# Patient Record
Sex: Male | Born: 1955 | Race: White | Hispanic: No | Smoking: Former smoker
Health system: Southern US, Community
[De-identification: ages and names within clinical notes are randomized; demographics above are authoritative.]

## PROBLEM LIST (undated history)

## (undated) DIAGNOSIS — R351 Nocturia: Secondary | ICD-10-CM

## (undated) DIAGNOSIS — K219 Gastro-esophageal reflux disease without esophagitis: Secondary | ICD-10-CM

## (undated) DIAGNOSIS — I499 Cardiac arrhythmia, unspecified: Secondary | ICD-10-CM

## (undated) DIAGNOSIS — Z973 Presence of spectacles and contact lenses: Secondary | ICD-10-CM

## (undated) DIAGNOSIS — Z974 Presence of external hearing-aid: Secondary | ICD-10-CM

## (undated) DIAGNOSIS — N401 Enlarged prostate with lower urinary tract symptoms: Secondary | ICD-10-CM

## (undated) DIAGNOSIS — Z87442 Personal history of urinary calculi: Secondary | ICD-10-CM

## (undated) DIAGNOSIS — Z87438 Personal history of other diseases of male genital organs: Secondary | ICD-10-CM

## (undated) DIAGNOSIS — K5090 Crohn's disease, unspecified, without complications: Secondary | ICD-10-CM

## (undated) DIAGNOSIS — N201 Calculus of ureter: Secondary | ICD-10-CM

## (undated) HISTORY — PX: CYSTOSCOPY/RETROGRADE/URETEROSCOPY/STONE EXTRACTION WITH BASKET: SHX5317

---

## 1995-10-02 HISTORY — PX: LUNG BIOPSY: SHX232

## 1998-05-24 ENCOUNTER — Ambulatory Visit (HOSPITAL_COMMUNITY): Admission: RE | Admit: 1998-05-24 | Discharge: 1998-05-24 | Payer: Self-pay | Admitting: Gastroenterology

## 2000-06-17 ENCOUNTER — Ambulatory Visit (HOSPITAL_COMMUNITY): Admission: RE | Admit: 2000-06-17 | Discharge: 2000-06-17 | Payer: Self-pay | Admitting: Gastroenterology

## 2000-09-25 ENCOUNTER — Ambulatory Visit (HOSPITAL_COMMUNITY): Admission: RE | Admit: 2000-09-25 | Discharge: 2000-09-25 | Payer: Self-pay | Admitting: Gastroenterology

## 2002-01-15 ENCOUNTER — Encounter: Admission: RE | Admit: 2002-01-15 | Discharge: 2002-01-15 | Payer: Self-pay | Admitting: Otolaryngology

## 2002-01-15 ENCOUNTER — Encounter: Payer: Self-pay | Admitting: Otolaryngology

## 2009-09-26 ENCOUNTER — Encounter: Admission: RE | Admit: 2009-09-26 | Discharge: 2009-09-26 | Payer: Self-pay | Admitting: Family Medicine

## 2009-10-03 ENCOUNTER — Ambulatory Visit: Payer: Self-pay | Admitting: Oncology

## 2009-10-11 LAB — CBC WITH DIFFERENTIAL/PLATELET
BASO%: 0.3 % (ref 0.0–2.0)
Basophils Absolute: 0 10*3/uL (ref 0.0–0.1)
EOS%: 3 % (ref 0.0–7.0)
Eosinophils Absolute: 0.3 10*3/uL (ref 0.0–0.5)
HCT: 45.5 % (ref 38.4–49.9)
HGB: 15.8 g/dL (ref 13.0–17.1)
LYMPH%: 14.3 % (ref 14.0–49.0)
MCH: 31.8 pg (ref 27.2–33.4)
MCHC: 34.7 g/dL (ref 32.0–36.0)
MCV: 91.8 fL (ref 79.3–98.0)
MONO#: 0.8 10*3/uL (ref 0.1–0.9)
MONO%: 8.8 % (ref 0.0–14.0)
NEUT#: 6.3 10*3/uL (ref 1.5–6.5)
NEUT%: 73.6 % (ref 39.0–75.0)
Platelets: 203 10*3/uL (ref 140–400)
RBC: 4.96 10*6/uL (ref 4.20–5.82)
RDW: 12.7 % (ref 11.0–14.6)
WBC: 8.5 10*3/uL (ref 4.0–10.3)
lymph#: 1.2 10*3/uL (ref 0.9–3.3)

## 2009-10-15 LAB — COMPREHENSIVE METABOLIC PANEL
ALT: 31 U/L (ref 0–53)
AST: 21 U/L (ref 0–37)
Albumin: 3.7 g/dL (ref 3.5–5.2)
Alkaline Phosphatase: 90 U/L (ref 39–117)
BUN: 8 mg/dL (ref 6–23)
CO2: 27 mEq/L (ref 19–32)
Calcium: 8.5 mg/dL (ref 8.4–10.5)
Chloride: 104 mEq/L (ref 96–112)
Creatinine, Ser: 0.94 mg/dL (ref 0.40–1.50)
Glucose, Bld: 79 mg/dL (ref 70–99)
Potassium: 3.6 mEq/L (ref 3.5–5.3)
Sodium: 143 mEq/L (ref 135–145)
Total Bilirubin: 0.4 mg/dL (ref 0.3–1.2)
Total Protein: 5.8 g/dL — ABNORMAL LOW (ref 6.0–8.3)

## 2009-10-15 LAB — HYPERCOAGULABLE PANEL, COMPREHENSIVE
AntiThromb III Func: 117 % (ref 76–126)
Anticardiolipin IgA: <3 APL U/mL (ref <10)
Anticardiolipin IgG: 3 GPL U/mL (ref <10)
Anticardiolipin IgM: 4 MPL U/mL (ref <10)
Beta-2 Glyco I IgG: <3 U/mL (ref <=15)
Beta-2-Glycoprotein I IgA: <3 U/mL (ref <=15)
Beta-2-Glycoprotein I IgM: <3 U/mL (ref <=15)
DRVVT 1:1 Mix: 43.4 secs (ref 36.1–47.0)
DRVVT: 105.5 secs — ABNORMAL HIGH (ref 34.7–40.5)
Drvvt confirmation: 1.64 Ratio — ABNORMAL HIGH (ref <1.18)
Homocysteine: 6.1 umol/L (ref 4.0–15.4)
Lupus Anticoagulant: DETECTED
PTT Lupus Anticoagulant: 97.8 secs — ABNORMAL HIGH (ref 32.0–43.4)
PTTLA 4:1 Mix: 60.5 secs — ABNORMAL HIGH (ref 36.3–48.8)
PTTLA Confirmation: 20.5 secs — ABNORMAL HIGH (ref <8.0)
Protein C Activity: <15 % (ref 75–133)
Protein C, Total: 91 % (ref 70–140)
Protein S Activity: 31 % — ABNORMAL LOW (ref 69–129)
Protein S Ag, Total: 58 % — ABNORMAL LOW (ref 70–140)

## 2009-10-20 LAB — HEXAGONAL PHOSPHOLIPID NEUTRALIZATION: Hex Phosph Neut Test: POSITIVE — AB

## 2009-10-21 ENCOUNTER — Encounter: Admission: RE | Admit: 2009-10-21 | Discharge: 2009-10-21 | Payer: Self-pay | Admitting: Oncology

## 2009-12-12 ENCOUNTER — Ambulatory Visit: Payer: Self-pay | Admitting: Oncology

## 2009-12-15 LAB — LUPUS ANTICOAGULANT PANEL
DRVVT 1:1 Mix: 42.9 secs (ref 36.2–44.3)
DRVVT: 83 secs — ABNORMAL HIGH (ref 36.2–44.3)
PTT Lupus Anticoagulant: 72 secs — ABNORMAL HIGH (ref 32.0–43.4)
PTTLA 4:1 Mix: 49.2 secs — ABNORMAL HIGH (ref 36.3–48.8)
PTTLA Confirmation: 1.2 secs (ref <8.0)

## 2010-05-30 IMAGING — CT CT ABD-PELV W/ CM
3 of 5 series · 13 of 36 positions shown, 19 images · IV contrast (READICAT/WATER & [ID] OMNI 300)
Comparison: None

CLINICAL DATA: Abdominal pain, history of Crohn's disease on, also
history of pulmonary embolism

CT ABDOMEN AND PELVIS WITH CONTRAST
TECHNIQUE: Multidetector CT imaging of the abdomen and pelvis was
performed following the standard protocol during bolus
administration of intravenous contrast.
Contrast: 100 ml 2mnipaque-GVV

[Series 3: routine abdomen · axial · 0.75mm/px · z∈[-404,-24]mm · 8 of 97 slices shown, 13 images]
[im 11/97  soft-tissue]
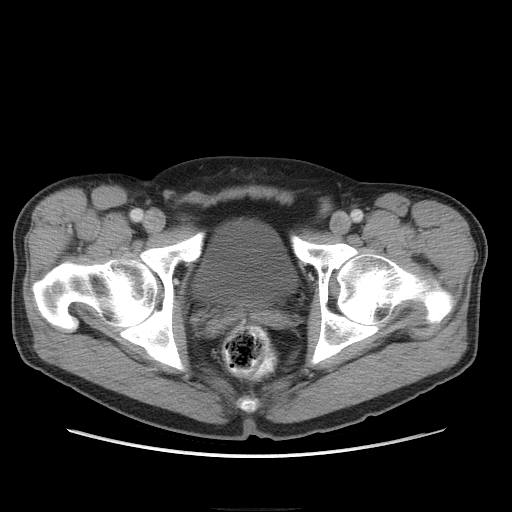
[im 11/97  bone]
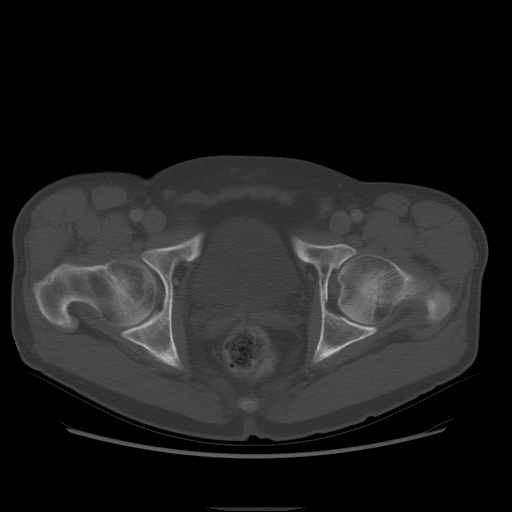
[im 22/97  soft-tissue]
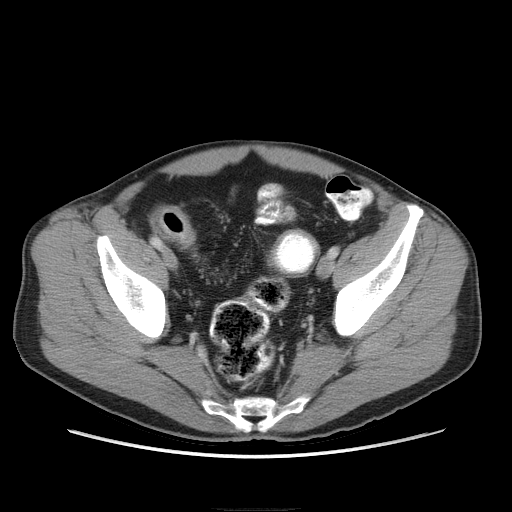
[im 33/97  soft-tissue]
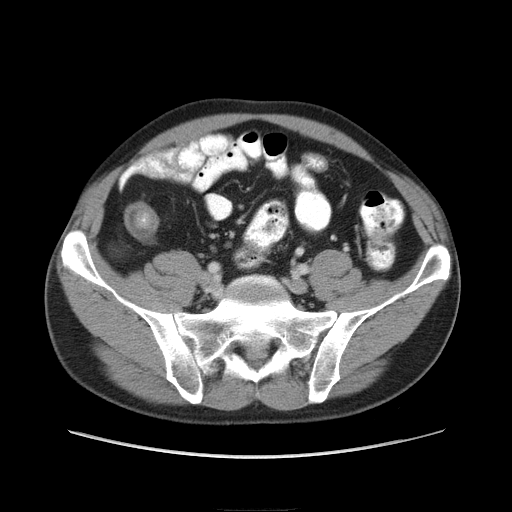
[im 43/97  soft-tissue]
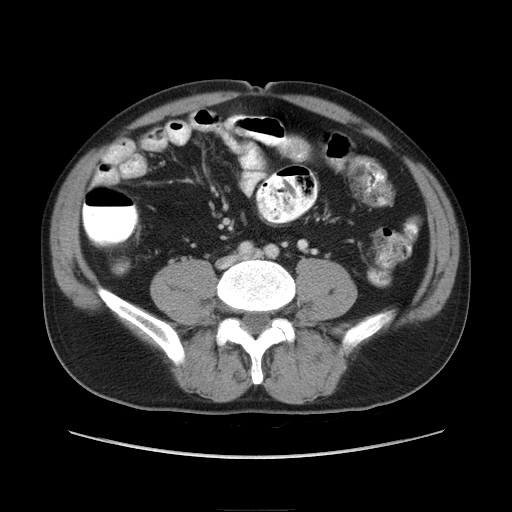
[im 54/97  soft-tissue]
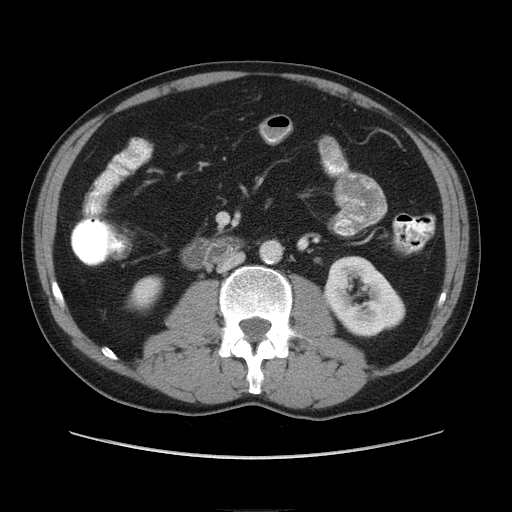
[im 54/97  lung]
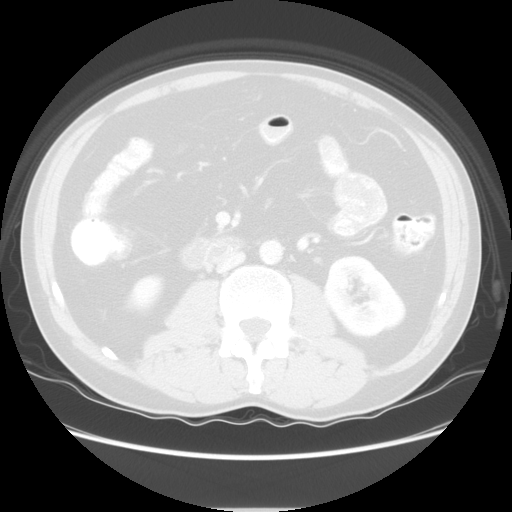
[im 65/97  soft-tissue]
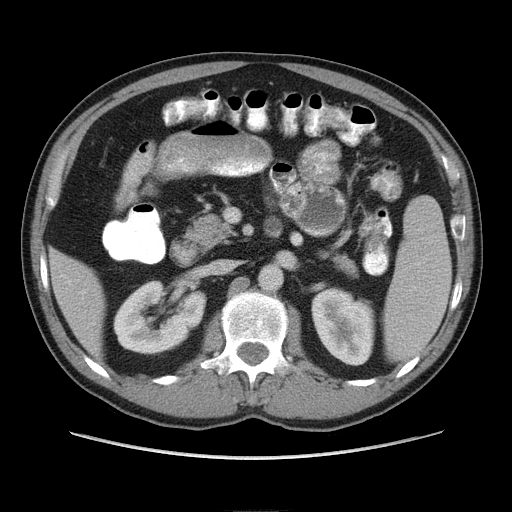
[im 65/97  lung]
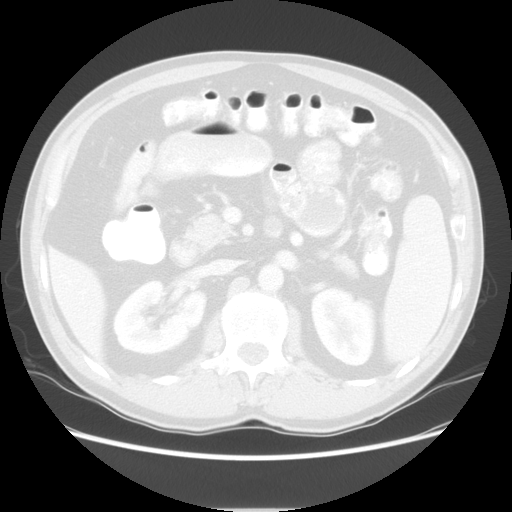
[im 75/97  soft-tissue]
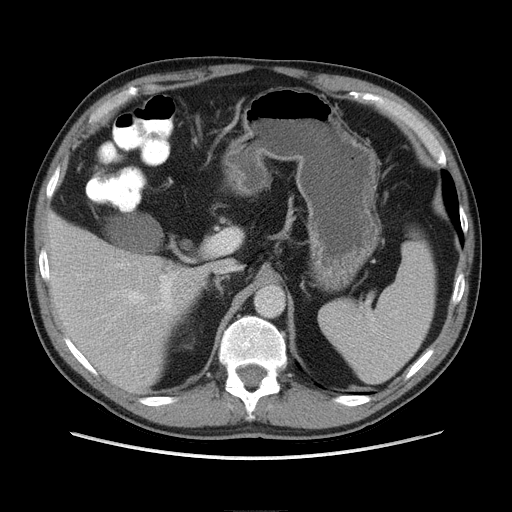
[im 75/97  lung]
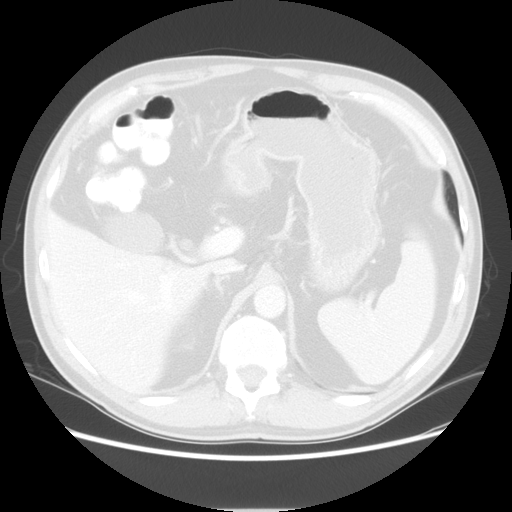
[im 86/97  soft-tissue]
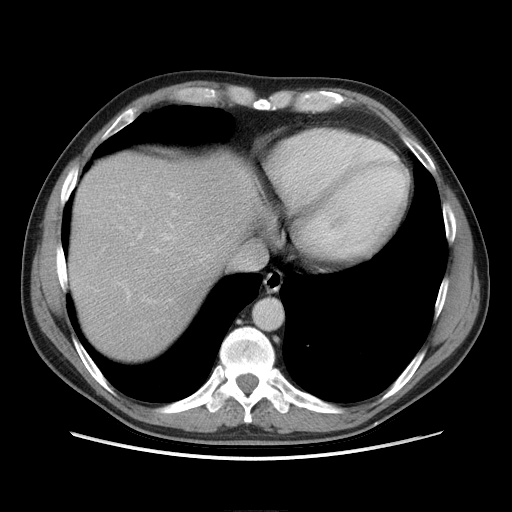
[im 86/97  lung]
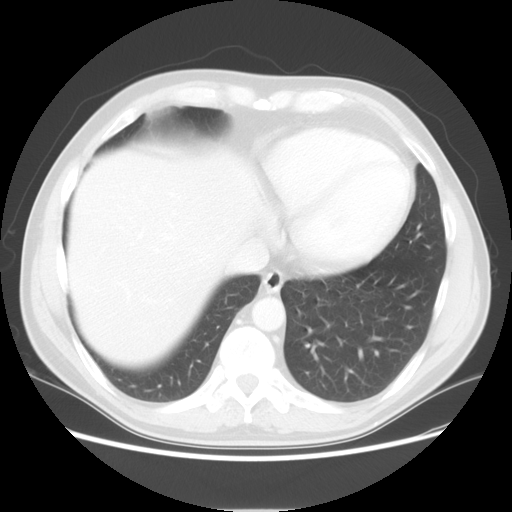

[Series 601: coronal body · coronal · 0.99mm/px · 1 of 122 slices shown, 2 images]
[im 41/122  soft-tissue]
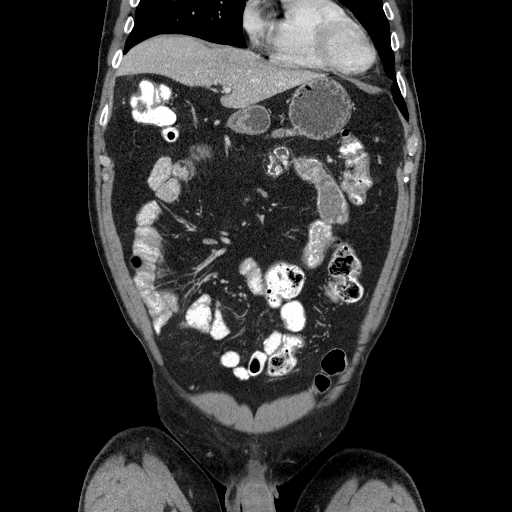
[im 41/122  bone]
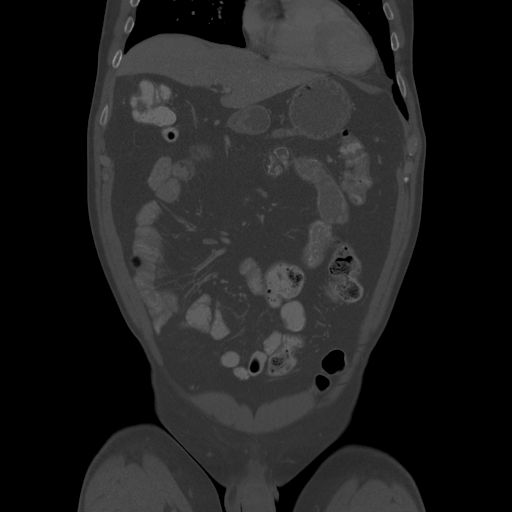

[Series 602: sagittal body · sagittal · 0.99mm/px · 4 of 151 slices shown]
[im 11/151  soft-tissue]
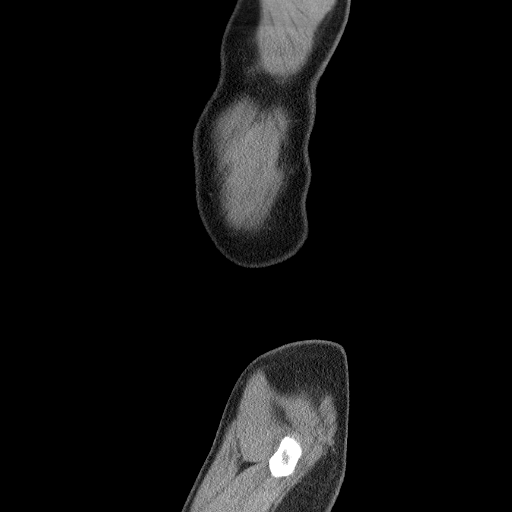
[im 31/151  soft-tissue]
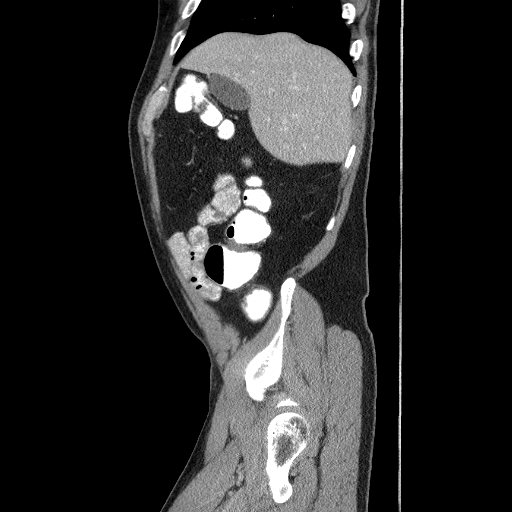
[im 51/151  soft-tissue]
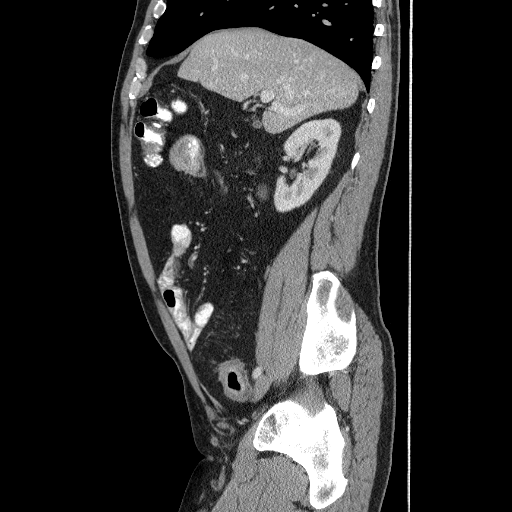
[im 71/151  soft-tissue]
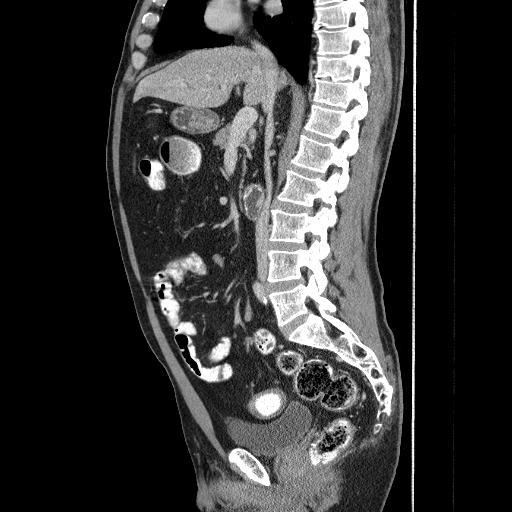

[13 of 36 positions shown; findings below may reference images not displayed]

FINDINGS: The lung bases are clear.  The liver enhances with no
focal abnormality and no ductal dilatation is seen.  No calcified
gallstones are noted.  The pancreas is normal in size and the
pancreatic duct is not dilated.  The adrenal glands and spleen are
unremarkable.  The stomach is not optimally distended but no
abnormality is evident.  The kidneys enhance with no calculus or
mass and no hydronephrosis is seen on delayed images.  The
abdominal aorta is normal in caliber with minimal atheromatous
change.

There is circumferential mucosal edema of the distal and terminal
ileum over a length of approximately 25-30 cm.  There is some
dilatation of the more proximal involved distal ileal segment which
may indicate a mild stricture of the distal ileum.  These findings
are consistent with active and chronic Crohn's disease. No abscess
is seen.  The urinary bladder is unremarkable.  The prostate is
normal in size.  There may also very mild mucosal edema involving
the ileocecal valve and base of the cecum which may indicate some
colonic involvement by inflammatory bowel disease as well. No bony
abnormality is seen
IMPRESSION: 1.  Mucosal edema of the distal and terminal ileum with some
possible involvement of the base of the cecum consistent with
active and chronic Crohn's disease.  No abscess.
2.  Slight dilatation of the more proximal involved segment of
distal ileum suggesting mild stricture formation.

## 2010-10-22 ENCOUNTER — Encounter: Payer: Self-pay | Admitting: Oncology

## 2010-10-23 ENCOUNTER — Encounter
Admission: RE | Admit: 2010-10-23 | Discharge: 2010-10-23 | Payer: Self-pay | Source: Home / Self Care | Attending: Gastroenterology | Admitting: Gastroenterology

## 2010-11-01 ENCOUNTER — Encounter: Payer: Self-pay | Admitting: Gastroenterology

## 2014-01-13 ENCOUNTER — Other Ambulatory Visit: Payer: Self-pay | Admitting: Gastroenterology

## 2014-01-25 ENCOUNTER — Encounter (HOSPITAL_COMMUNITY): Payer: Self-pay | Admitting: Pharmacy Technician

## 2014-02-01 ENCOUNTER — Encounter (HOSPITAL_COMMUNITY): Payer: Self-pay | Admitting: *Deleted

## 2014-02-16 ENCOUNTER — Encounter (HOSPITAL_COMMUNITY)
Admission: RE | Disposition: A | Discharge status: Home / Self Care | Payer: Self-pay | Source: Ambulatory Visit | Attending: Gastroenterology

## 2014-02-16 ENCOUNTER — Encounter (INDEPENDENT_AMBULATORY_CARE_PROVIDER_SITE_OTHER): Payer: Self-pay

## 2014-02-16 ENCOUNTER — Encounter (HOSPITAL_COMMUNITY): Payer: Self-pay | Admitting: *Deleted

## 2014-02-16 ENCOUNTER — Ambulatory Visit (HOSPITAL_COMMUNITY)
Admission: RE | Admit: 2014-02-16 | Discharge: 2014-02-16 | Disposition: A | Discharge status: Home / Self Care | Payer: 59 | Source: Ambulatory Visit | Attending: Gastroenterology | Admitting: Gastroenterology

## 2014-02-16 ENCOUNTER — Ambulatory Visit (HOSPITAL_COMMUNITY): Payer: 59 | Admitting: Anesthesiology

## 2014-02-16 ENCOUNTER — Encounter (HOSPITAL_COMMUNITY): Payer: 59 | Admitting: Anesthesiology

## 2014-02-16 DIAGNOSIS — IMO0002 Reserved for concepts with insufficient information to code with codable children: Secondary | ICD-10-CM | POA: Insufficient documentation

## 2014-02-16 DIAGNOSIS — M810 Age-related osteoporosis without current pathological fracture: Secondary | ICD-10-CM | POA: Insufficient documentation

## 2014-02-16 DIAGNOSIS — D128 Benign neoplasm of rectum: Secondary | ICD-10-CM | POA: Insufficient documentation

## 2014-02-16 DIAGNOSIS — E538 Deficiency of other specified B group vitamins: Secondary | ICD-10-CM | POA: Insufficient documentation

## 2014-02-16 DIAGNOSIS — E7800 Pure hypercholesterolemia, unspecified: Secondary | ICD-10-CM | POA: Insufficient documentation

## 2014-02-16 DIAGNOSIS — Z1211 Encounter for screening for malignant neoplasm of colon: Secondary | ICD-10-CM | POA: Insufficient documentation

## 2014-02-16 DIAGNOSIS — D129 Benign neoplasm of anus and anal canal: Secondary | ICD-10-CM | POA: Insufficient documentation

## 2014-02-16 DIAGNOSIS — K219 Gastro-esophageal reflux disease without esophagitis: Secondary | ICD-10-CM | POA: Insufficient documentation

## 2014-02-16 DIAGNOSIS — K5010 Crohn's disease of large intestine without complications: Secondary | ICD-10-CM | POA: Insufficient documentation

## 2014-02-16 DIAGNOSIS — I059 Rheumatic mitral valve disease, unspecified: Secondary | ICD-10-CM | POA: Insufficient documentation

## 2014-02-16 DIAGNOSIS — D126 Benign neoplasm of colon, unspecified: Secondary | ICD-10-CM | POA: Insufficient documentation

## 2014-02-16 HISTORY — DX: Crohn's disease, unspecified, without complications: K50.90

## 2014-02-16 HISTORY — PX: COLONOSCOPY WITH PROPOFOL: SHX5780

## 2014-02-16 SURGERY — COLONOSCOPY WITH PROPOFOL
Anesthesia: Monitor Anesthesia Care

## 2014-02-16 MED ORDER — LACTATED RINGERS IV SOLN: INTRAVENOUS | Status: DC

## 2014-02-16 MED ORDER — PROPOFOL 10 MG/ML IV BOLUS
INTRAVENOUS | Status: AC
  Filled 2014-02-16: qty 20

## 2014-02-16 MED ORDER — PROPOFOL 10 MG/ML IV BOLUS
INTRAVENOUS | Status: DC | PRN
  Administered 2014-02-16: 50 mg via INTRAVENOUS
  Administered 2014-02-16 (×2): 25 mg via INTRAVENOUS
  Administered 2014-02-16: 50 mg via INTRAVENOUS
  Administered 2014-02-16 (×2): 25 mg via INTRAVENOUS
  Administered 2014-02-16: 50 mg via INTRAVENOUS
  Administered 2014-02-16: 25 mg via INTRAVENOUS
  Administered 2014-02-16 (×2): 50 mg via INTRAVENOUS

## 2014-02-16 MED ORDER — LACTATED RINGERS IV SOLN
INTRAVENOUS | Status: DC | PRN
  Administered 2014-02-16: 12:00:00 via INTRAVENOUS

## 2014-02-16 SURGICAL SUPPLY — 22 items

## 2014-02-16 NOTE — Anesthesia Postprocedure Evaluation (Signed)
  Anesthesia Post-op Note  Patient: Nathan Shaw  Procedure(s) Performed: Procedure(s) (LRB): COLONOSCOPY WITH PROPOFOL (N/A)  Patient Location: PACU  Anesthesia Type: MAC  Level of Consciousness: awake and alert   Airway and Oxygen Therapy: Patient Spontanous Breathing  Post-op Pain: mild  Post-op Assessment: Post-op Vital signs reviewed, Patient's Cardiovascular Status Stable, Respiratory Function Stable, Patent Airway and No signs of Nausea or vomiting  Last Vitals:  Filed Vitals:   02/16/14 1305  BP: 110/69  Temp: 36.4 C  Resp: 20    Post-op Vital Signs: stable   Complications: No apparent anesthesia complications

## 2014-02-16 NOTE — Transfer of Care (Signed)
Immediate Anesthesia Transfer of Care Note  Patient: Nathan Shaw  Procedure(s) Performed: Procedure(s): COLONOSCOPY WITH PROPOFOL (N/A)  Patient Location: PACU  Anesthesia Type:MAC  Level of Consciousness: awake, sedated and patient cooperative  Airway & Oxygen Therapy: Patient Spontanous Breathing and Patient connected to face mask oxygen  Post-op Assessment: Report given to PACU RN and Post -op Vital signs reviewed and stable  Post vital signs: Reviewed and stable  Complications: No apparent anesthesia complications

## 2014-02-16 NOTE — H&P (Signed)
  Problem: Crohn's disease of the duodenum, ileum, right colon  History: The patient is a 58 year old male born [birth year]. He was diagnosed with Crohn's disease involving the duodenum, ileum, and right colon. In March 2010, colonoscopy showed an active Crohn's ileocolitis.  The patient has completed his course of prednisone therapy to treat recurrent Crohn's ileocolitis. His fever, abdominal pain, and diarrhea have resolved.  The patient is scheduled to undergo a surveillance colonoscopy today.  Past medical history: Crohn's ileocolitis. Osteoporosis. Vitamin B12 deficiency. Degenerative disc disease. Gastroesophageal reflux. Mitral valve prolapse. Hypercholesterolemia. History of kidney stones. Pulmonary embolism diagnosed in 2011. Allergic rhinitis. 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 Vasectomy.  Medication allergies: None  Exam: The patient is alert and lying comfortably on the endoscopy stretcher. Abdomen is soft and nontender to palpation. Lungs are clear to auscultation. Cardiac exam reveals a regular rhythm.  Plan: Proceed with surveillance colonoscopy

## 2014-02-16 NOTE — Op Note (Signed)
Problem: Crohn's disease involving the terminal ileum and right colon. Inactive Crohn's colitis by colonoscopy performed on 12/10/2008  Endoscopist: Earle Gell  Premedication: Propofol administered by anesthesia  Procedure: Surveillance colonoscopy The patient was placed in the left lateral decubitus position. Anal inspection and digital rectal exam were normal. The prostate was nonnodular. The Pentax pediatric colonoscope was introduced into the rectum and advanced to the cecum. A normal-appearing appendiceal orifice and stenotic ileocecal valve were identified. I was unable to intubate the stenotic ileocecal valve to examine the terminal ileum. Colonic preparation for the exam today was good.  Rectum. From the distal rectum, a 7 mm sessile polyp was removed with the electrocautery snare. From the mid rectum, a 5 mm sessile polyp was removed with the cold snare.  Sigmoid colon and descending colon. Normal  Splenic flexure. Normal  Transverse colon. Normal  Hepatic flexure. Normal  Ascending colon. In active colitis manifested by generalized lost in the mucosal vascular pattern with mucosal scarring.  Cecum and ileocecal valve. In active colitis manifested by generalized lost in the mucosal vascular pattern with mucosal scarring. Stenotic ileocecal valve.  Surveillance mucosal biopsies. 8 biopsies were performed from the cecum and descending colon to look for mucosal dysplasia  Assessment:  #1. Active Crohn's colitis involving the cecum and descending colon; the remainder of the colonic mucosa appeared normal without evidence of Crohn's colitis  #2. 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 From the distal rectum a 7 mm sessile polyp was removed with the hot snare  #4. From the mid rectum a 5 mm sessile polyp was removed with the cold snare  Recommendation: Await pathology

## 2014-02-16 NOTE — Anesthesia Preprocedure Evaluation (Signed)
Anesthesia Evaluation  Patient identified by MRN, date of birth, ID band Patient awake    Reviewed: Allergy & Precautions, H&P , NPO status , Patient's Chart, lab work & pertinent test results  Airway Mallampati: II TM Distance: >3 FB Neck ROM: Full    Dental no notable dental hx.    Pulmonary neg pulmonary ROS, former smoker,  breath sounds clear to auscultation  Pulmonary exam normal       Cardiovascular negative cardio ROS  Rhythm:Regular Rate:Normal     Neuro/Psych negative neurological ROS  negative psych ROS   GI/Hepatic Neg liver ROS, chrohns dz   Endo/Other  negative endocrine ROS  Renal/GU negative Renal ROS  negative genitourinary   Musculoskeletal negative musculoskeletal ROS (+)   Abdominal   Peds negative pediatric ROS (+)  Hematology negative hematology ROS (+)   Anesthesia Other Findings   Reproductive/Obstetrics negative OB ROS                           Anesthesia Physical Anesthesia Plan  ASA: II  Anesthesia Plan: MAC   Post-op Pain Management:    Induction: Intravenous  Airway Management Planned: Nasal Cannula  Additional Equipment:   Intra-op Plan:   Post-operative Plan:   Informed Consent: I have reviewed the patients History and Physical, chart, labs and discussed the procedure including the risks, benefits and alternatives for the proposed anesthesia with the patient or authorized representative who has indicated his/her understanding and acceptance.   Dental advisory given  Plan Discussed with: CRNA and Surgeon  Anesthesia Plan Comments:         Anesthesia Quick Evaluation

## 2014-02-18 ENCOUNTER — Encounter (HOSPITAL_COMMUNITY): Payer: Self-pay | Admitting: Gastroenterology

## 2014-07-30 ENCOUNTER — Other Ambulatory Visit: Payer: Self-pay | Admitting: Orthopedic Surgery

## 2014-08-03 ENCOUNTER — Encounter (HOSPITAL_BASED_OUTPATIENT_CLINIC_OR_DEPARTMENT_OTHER): Payer: Self-pay | Admitting: *Deleted

## 2014-08-03 NOTE — Progress Notes (Signed)
No labs needed

## 2014-08-05 ENCOUNTER — Encounter (HOSPITAL_BASED_OUTPATIENT_CLINIC_OR_DEPARTMENT_OTHER): Payer: Self-pay | Admitting: *Deleted

## 2014-08-05 ENCOUNTER — Encounter (HOSPITAL_BASED_OUTPATIENT_CLINIC_OR_DEPARTMENT_OTHER)
Admission: RE | Disposition: A | Discharge status: Home / Self Care | Payer: Self-pay | Source: Ambulatory Visit | Attending: Orthopedic Surgery

## 2014-08-05 ENCOUNTER — Observation Stay (HOSPITAL_BASED_OUTPATIENT_CLINIC_OR_DEPARTMENT_OTHER): Payer: 59 | Admitting: Anesthesiology

## 2014-08-05 ENCOUNTER — Observation Stay (HOSPITAL_BASED_OUTPATIENT_CLINIC_OR_DEPARTMENT_OTHER)
Admission: RE | Admit: 2014-08-05 | Discharge: 2014-08-05 | Disposition: A | Discharge status: Home / Self Care | Payer: 59 | Source: Ambulatory Visit | Attending: Orthopedic Surgery | Admitting: Orthopedic Surgery

## 2014-08-05 DIAGNOSIS — Z7982 Long term (current) use of aspirin: Secondary | ICD-10-CM | POA: Diagnosis not present

## 2014-08-05 DIAGNOSIS — G5601 Carpal tunnel syndrome, right upper limb: Principal | ICD-10-CM | POA: Insufficient documentation

## 2014-08-05 DIAGNOSIS — Z87891 Personal history of nicotine dependence: Secondary | ICD-10-CM | POA: Insufficient documentation

## 2014-08-05 DIAGNOSIS — K5090 Crohn's disease, unspecified, without complications: Secondary | ICD-10-CM | POA: Insufficient documentation

## 2014-08-05 HISTORY — PX: CARPAL TUNNEL RELEASE: SHX101

## 2014-08-05 HISTORY — DX: Presence of spectacles and contact lenses: Z97.3

## 2014-08-05 SURGERY — CARPAL TUNNEL RELEASE
Anesthesia: Regional | Laterality: Right

## 2014-08-05 MED ORDER — MIDAZOLAM HCL 2 MG/2ML IJ SOLN
INTRAMUSCULAR | Status: AC
  Filled 2014-08-05: qty 2

## 2014-08-05 MED ORDER — CEFAZOLIN SODIUM-DEXTROSE 2-3 GM-% IV SOLR: 2.0000 g | INTRAVENOUS | Status: DC

## 2014-08-05 MED ORDER — LACTATED RINGERS IV SOLN
INTRAVENOUS | Status: DC
  Administered 2014-08-05 (×2): via INTRAVENOUS

## 2014-08-05 MED ORDER — MIDAZOLAM HCL 5 MG/5ML IJ SOLN
INTRAMUSCULAR | Status: DC | PRN
  Administered 2014-08-05: 2 mg via INTRAVENOUS

## 2014-08-05 MED ORDER — BUPIVACAINE HCL (PF) 0.25 % IJ SOLN
INTRAMUSCULAR | Status: DC | PRN
  Administered 2014-08-05: 7 mL

## 2014-08-05 MED ORDER — MIDAZOLAM HCL 2 MG/2ML IJ SOLN: 1.0000 mg | INTRAMUSCULAR | Status: DC | PRN

## 2014-08-05 MED ORDER — HYDROCODONE-ACETAMINOPHEN 5-325 MG PO TABS: 1.0000 | ORAL_TABLET | Freq: Four times a day (QID) | ORAL | Status: DC | PRN

## 2014-08-05 MED ORDER — 0.9 % SODIUM CHLORIDE (POUR BTL) OPTIME
TOPICAL | Status: DC | PRN
  Administered 2014-08-05: 1000 mL

## 2014-08-05 MED ORDER — CHLORHEXIDINE GLUCONATE 4 % EX LIQD: 60.0000 mL | Freq: Once | CUTANEOUS | Status: DC

## 2014-08-05 MED ORDER — FENTANYL CITRATE 0.05 MG/ML IJ SOLN
INTRAMUSCULAR | Status: DC | PRN
  Administered 2014-08-05: 100 ug via INTRAVENOUS

## 2014-08-05 MED ORDER — CEFAZOLIN SODIUM-DEXTROSE 2-3 GM-% IV SOLR
2.0000 g | INTRAVENOUS | Status: AC
  Administered 2014-08-05: 2 g via INTRAVENOUS

## 2014-08-05 MED ORDER — FENTANYL CITRATE 0.05 MG/ML IJ SOLN
INTRAMUSCULAR | Status: AC
  Filled 2014-08-05: qty 2

## 2014-08-05 MED ORDER — FENTANYL CITRATE 0.05 MG/ML IJ SOLN: 50.0000 ug | INTRAMUSCULAR | Status: DC | PRN

## 2014-08-05 MED ORDER — PROPOFOL 10 MG/ML IV BOLUS
INTRAVENOUS | Status: DC | PRN
  Administered 2014-08-05: 40 mg via INTRAVENOUS

## 2014-08-05 MED ORDER — CEFAZOLIN SODIUM 1-5 GM-% IV SOLN
INTRAVENOUS | Status: AC
  Filled 2014-08-05: qty 50

## 2014-08-05 MED ORDER — ONDANSETRON HCL 4 MG/2ML IJ SOLN
INTRAMUSCULAR | Status: DC | PRN
  Administered 2014-08-05: 4 mg via INTRAVENOUS

## 2014-08-05 SURGICAL SUPPLY — 33 items
BLADE SURG 15 STRL LF DISP TIS (BLADE) ×1 IMPLANT
BLADE SURG 15 STRL SS (BLADE) ×2
BNDG CMPR 9X4 STRL LF SNTH (GAUZE/BANDAGES/DRESSINGS)
BNDG COHESIVE 3X5 TAN STRL LF (GAUZE/BANDAGES/DRESSINGS) ×2 IMPLANT
BNDG ESMARK 4X9 LF (GAUZE/BANDAGES/DRESSINGS) IMPLANT
BNDG GAUZE ELAST 4 BULKY (GAUZE/BANDAGES/DRESSINGS) ×2 IMPLANT
CHLORAPREP W/TINT 26ML (MISCELLANEOUS) ×2 IMPLANT
CORDS BIPOLAR (ELECTRODE) ×2 IMPLANT
COVER BACK TABLE 60X90IN (DRAPES) ×2 IMPLANT
COVER MAYO STAND STRL (DRAPES) ×2 IMPLANT
CUFF TOURNIQUET SINGLE 18IN (TOURNIQUET CUFF) ×2 IMPLANT
DRAPE EXTREMITY T 121X128X90 (DRAPE) ×2 IMPLANT
DRAPE SURG 17X23 STRL (DRAPES) ×2 IMPLANT
DRSG PAD ABDOMINAL 8X10 ST (GAUZE/BANDAGES/DRESSINGS) ×2 IMPLANT
GAUZE SPONGE 4X4 12PLY STRL (GAUZE/BANDAGES/DRESSINGS) ×2 IMPLANT
GAUZE XEROFORM 1X8 LF (GAUZE/BANDAGES/DRESSINGS) ×2 IMPLANT
GLOVE BIOGEL PI IND STRL 8.5 (GLOVE) ×1 IMPLANT
GLOVE BIOGEL PI INDICATOR 8.5 (GLOVE) ×1
GLOVE SURG ORTHO 8.0 STRL STRW (GLOVE) ×2 IMPLANT
GOWN STRL REUS W/ TWL LRG LVL3 (GOWN DISPOSABLE) ×1 IMPLANT
GOWN STRL REUS W/TWL LRG LVL3 (GOWN DISPOSABLE) ×2
GOWN STRL REUS W/TWL XL LVL3 (GOWN DISPOSABLE) ×2 IMPLANT
NEEDLE 27GAX1X1/2 (NEEDLE) IMPLANT
NS IRRIG 1000ML POUR BTL (IV SOLUTION) ×2 IMPLANT
PACK BASIN DAY SURGERY FS (CUSTOM PROCEDURE TRAY) ×2 IMPLANT
SPONGE GAUZE 4X4 12PLY STER LF (GAUZE/BANDAGES/DRESSINGS) ×1 IMPLANT
STOCKINETTE 4X48 STRL (DRAPES) ×2 IMPLANT
SUT VICRYL 4-0 PS2 18IN ABS (SUTURE) IMPLANT
SUT VICRYL RAPIDE 4/0 PS 2 (SUTURE) ×2 IMPLANT
SYR BULB 3OZ (MISCELLANEOUS) ×2 IMPLANT
SYR CONTROL 10ML LL (SYRINGE) IMPLANT
TOWEL OR 17X24 6PK STRL BLUE (TOWEL DISPOSABLE) ×2 IMPLANT
UNDERPAD 30X30 INCONTINENT (UNDERPADS AND DIAPERS) ×2 IMPLANT

## 2014-08-05 NOTE — Brief Op Note (Signed)
08/05/2014  2:27 PM  PATIENT:  Nathan Shaw  58 y.o. male  PRE-OPERATIVE DIAGNOSIS:  Right Carpal Tunnel Syndrome  POST-OPERATIVE DIAGNOSIS:  right carpal tunnel syndrome  PROCEDURE:  Procedure(s): RIGHT CARPAL TUNNEL RELEASE (Right)  SURGEON:  Surgeon(s) and Role:    * Daryll Brod, MD - Primary  PHYSICIAN ASSISTANT:   ASSISTANTS: none   ANESTHESIA:   local and regional  EBL:     BLOOD ADMINISTERED:none  DRAINS: none   LOCAL MEDICATIONS USED:  BUPIVICAINE   SPECIMEN:  Source of Specimen:  none  DISPOSITION OF SPECIMEN:  N/A  COUNTS:  YES  TOURNIQUET:   Total Tourniquet Time Documented: Forearm (Right) - 22 minutes Total: Forearm (Right) - 22 minutes   DICTATION: .Other Dictation: Dictation Number [id number]  PLAN OF CARE: Discharge to home after PACU  PATIENT DISPOSITION:  PACU - hemodynamically stable.

## 2014-08-05 NOTE — Anesthesia Preprocedure Evaluation (Signed)
Anesthesia Evaluation  Patient identified by MRN, date of birth, ID band Patient awake    Reviewed: Allergy & Precautions, H&P , NPO status , Patient's Chart, lab work & pertinent test results  Airway Mallampati: I  TM Distance: >3 FB Neck ROM: Full    Dental   Pulmonary former smoker,          Cardiovascular     Neuro/Psych    GI/Hepatic H/O Crohns Disease   Endo/Other    Renal/GU      Musculoskeletal   Abdominal   Peds  Hematology   Anesthesia Other Findings   Reproductive/Obstetrics                             Anesthesia Physical Anesthesia Plan  ASA: II  Anesthesia Plan: Bier Block   Post-op Pain Management:    Induction: Intravenous  Airway Management Planned: Simple Face Mask  Additional Equipment:   Intra-op Plan:   Post-operative Plan:   Informed Consent: I have reviewed the patients History and Physical, chart, labs and discussed the procedure including the risks, benefits and alternatives for the proposed anesthesia with the patient or authorized representative who has indicated his/her understanding and acceptance.     Plan Discussed with: CRNA and Surgeon  Anesthesia Plan Comments:         Anesthesia Quick Evaluation

## 2014-08-05 NOTE — Anesthesia Procedure Notes (Signed)
Procedure Name: MAC Date/Time: 08/05/2014 1:50 PM Performed by: Melynda Ripple D Pre-anesthesia Checklist: Patient identified, Timeout performed, Emergency Drugs available, Suction available and Patient being monitored

## 2014-08-05 NOTE — Anesthesia Postprocedure Evaluation (Signed)
Anesthesia Post Note  Patient: Nathan Shaw  Procedure(s) Performed: Procedure(s) (LRB): RIGHT CARPAL TUNNEL RELEASE (Right)  Anesthesia type: general  Patient location: PACU  Post pain: Pain level controlled  Post assessment: Patient's Cardiovascular Status Stable  Last Vitals:  Filed Vitals:   08/05/14 1445  BP: 112/77  Pulse: 61  Temp:   Resp: 13    Post vital signs: Reviewed and stable  Level of consciousness: sedated  Complications: No apparent anesthesia complications

## 2014-08-05 NOTE — H&P (Signed)
Nathan Shaw is a 58 year-old right-hand dominant male complaining of numbness and tingling especially on his right hand, much greater than his left. This has been going on for five to six years. He has had two injections to the right side which gave him good relief of symptoms, but states that he is now having numbness and tingling. He has no history of injury to his neck.  He had nerve conductions in 2012 revealing carpal tunnel syndrome bilaterally. He rides a motorcycle and this causes problems for him.  The numbness and tingling wakes him 3/7  nights. He has no history of injury to the hand or neck.  He has been taking ibuprofen.  He has history of Crohn's disease.  He has no history of diabetes, thyroid problems, arthritis or gout. He has had his nerve conductions done and this reveals continuation with slight worsening of his carpal tunnels especially right side. He now has diminution of amplitude to 20   ALLERGIES:     None.  MEDICATIONS:    Valtrex, omeprazole, fluticasone propionate, multivitamins, Zyrtec, aspirin, calcium +D, fish oil and vitamin B-12.  SURGICAL HISTORY:    Lung biopsy.  FAMILY MEDICAL HISTORY:    Negative.  SOCIAL HISTORY:     He does not smoke.  Drinks socially.  He is divorced.  REVIEW OF SYSTEMS:   Positive for glasses, contacts, hearing loss, otherwise negative. Nathan Shaw is an 58 y.o. male.   Chief Complaint: CTS right HPI: see above  Past Medical History  Diagnosis Date  . Crohn disease last flare up dec 2014/ jan 2015  . Wears contact lenses     Past Surgical History  Procedure Laterality Date  . Lung biopsy  1997     was infection  . Colonoscopy with propofol N/A 02/16/2014    Procedure: COLONOSCOPY WITH PROPOFOL;  Surgeon: Garlan Fair, MD;  Location: [facility];  Service: Endoscopy;  Laterality: N/A;    History reviewed. No pertinent family history. Social History:  reports that he quit smoking about 10 years ago. He has never used  smokeless tobacco. He reports that he drinks alcohol. He reports that he does not use illicit drugs.  Allergies: No Known Allergies  Medications Prior to Admission  Medication Sig Dispense Refill  . aspirin EC 81 MG tablet Take 81 mg by mouth daily.    . calcium carbonate (OS-CAL) 600 MG TABS tablet Take 600 mg by mouth daily with breakfast.    . cetirizine (ZYRTEC) 10 MG tablet Take 10 mg by mouth daily.    . cholecalciferol (VITAMIN D) 1000 UNITS tablet Take 1,000 Units by mouth daily.    . Cyanocobalamin (VITAMIN B-12 IJ) Inject as directed every 30 (thirty) days.    . fluticasone (FLONASE) 50 MCG/ACT nasal spray Place 1 spray into both nostrils daily.    . Misc Natural Products (GLUCOS-CHONDROIT-MSM COMPLEX PO) Take by mouth.    . Multiple Vitamin (MULTIVITAMIN WITH MINERALS) TABS tablet Take 1 tablet by mouth daily.    Marland Kitchen omega-3 acid ethyl esters (LOVAZA) 1 G capsule Take 1 g by mouth daily.    Marland Kitchen omeprazole (PRILOSEC) 20 MG capsule Take 20 mg by mouth daily.    . valACYclovir (VALTREX) 500 MG tablet Take 500 mg by mouth daily.    . vardenafil (LEVITRA) 10 MG tablet Take 10 mg by mouth daily as needed for erectile dysfunction.      No results found for this or any previous visit (from the  past 48 hour(s)).  No results found.   Pertinent items are noted in HPI.  Blood pressure 114/81, pulse 71, temperature 98.4 F (36.9 C), temperature source Oral, resp. rate 18, height 6' (1.829 m), weight 82.101 kg (181 lb), SpO2 99 %.  General appearance: alert, cooperative and appears stated age Head: Normocephalic, without obvious abnormality Neck: no JVD Resp: clear to auscultation bilaterally Cardio: regular rate and rhythm, S1, S2 normal, no murmur, click, rub or gallop GI: soft, non-tender; bowel sounds normal; no masses,  no organomegaly Extremities: numbness right hand Pulses: 2+ and symmetric Skin: Skin color, texture, turgor normal. No rashes or lesions Neurologic: Grossly  normal Incision/Wound: na  Assessment/Plan Dx: CTS right He would like to have this surgically released.    The pre, peri and postoperative course were discussed along with the risks and complications.  The patient is aware there is no guarantee with the surgery, possibility of infection, recurrence, injury to arteries, nerves, tendons, incomplete relief of symptoms and dystrophy.   He would like to have only one side done.  He is scheduled for right carpal tunnel release as an outpatient under regional anesthesia.    Kiyoto Slomski R 08/05/2014, 12:44 PM

## 2014-08-05 NOTE — Op Note (Signed)
NAMEBEVIN, Nathan Shaw                ACCOUNT NO.:  [id number]  MEDICAL RECORD NO.:  [id number]  LOCATION:                                 FACILITY:  PHYSICIAN:  Daryll Brod, M.D.       DATE OF BIRTH:  November 21, 1955  DATE OF PROCEDURE:  08/05/2014 DATE OF DISCHARGE:                              OPERATIVE REPORT   PREOPERATIVE DIAGNOSIS:  Carpal tunnel syndrome, right hand.  POSTOPERATIVE DIAGNOSIS:  Carpal tunnel syndrome, right hand.  OPERATION:  Decompression of right median nerve.  SURGEON:  Daryll Brod, M.D.  ANESTHESIA:  Forearm-based IV regional with local infiltration.  ANESTHESIOLOGIST:  Crissie Sickles. Conrad Monroe, M.D.  HISTORY:  The patient is a 58 year old male with a history of carpal tunnel syndrome, nerve conduction is positive, nonresponsive to conservative treatment.  He has elected to undergo surgical decompression of the median nerve.  Pre, peri, postoperative course have been discussed along with risks and complications.  He is aware that there is no guarantee with the surgery, possibility of infection; recurrence of injury to arteries, nerves, tendons, incomplete relief of symptoms, dystrophy.  In the preoperative area, the patient is seen, the extremity marked by both the patient and surgeon.  Antibiotic given.  PROCEDURE IN DETAIL:  The patient was brought to the operating room, where a forearm-based IV regional anesthetic was carried out without difficulty.  He was prepped using ChloraPrep, supine position with the right arm free.  A 3-minute dry time was allowed.  Time-out taken, confirming the patient and procedure.  The limb was elevated after time- out taken.  A longitudinal incision was made in the right palm, carried down through subcutaneous tissue.  Bleeders were electrocauterized. Palmar fascia was split.  Superficial palmar arch identified.  Flexor tendon of the ring and little finger identified to the ulnar side of the median nerve.  The carpal retinaculum was  incised with sharp dissection. Right angle and Sewall retractor were placed between the skin and forearm fascia.  The fascia released for approximately a centimeter and half proximal to the wrist crease.  The median nerve had a tendency to bulge through the retinaculum.  The tenosynovium tissue was explored. The large lumbricals were present.  These were not excised.  The wound was copiously irrigated with saline.  The skin was then closed with interrupted 4-0 Vicryl Rapide sutures.  Sterile compressive dressing was applied after injection of 0.25% bupivacaine without epinephrine was given, approximately 7 mL was used.  A sterile compressive dressing was applied.  On deflation of the tourniquet, all fingers immediately pinked.  He was taken to the recovery room for observation in satisfactory condition.  He will be discharged home to return to the [facility] in 1 week on Vicodin.          ______________________________ Daryll Brod, M.D.     GK/MEDQ  D:  08/05/2014  T:  08/05/2014  Job:  [id number]

## 2014-08-05 NOTE — Discharge Instructions (Addendum)

## 2014-08-05 NOTE — Transfer of Care (Signed)
Immediate Anesthesia Transfer of Care Note  Patient: Nathan Shaw  Procedure(s) Performed: Procedure(s): RIGHT CARPAL TUNNEL RELEASE (Right)  Patient Location: PACU  Anesthesia Type:MAC and Bier block  Level of Consciousness: awake, alert  and oriented  Airway & Oxygen Therapy: Patient Spontanous Breathing and Patient connected to face mask oxygen  Post-op Assessment: Report given to PACU RN and Post -op Vital signs reviewed and stable  Post vital signs: Reviewed and stable  Complications: No apparent anesthesia complications

## 2014-08-05 NOTE — Op Note (Signed)
Dictation Number [id number]

## 2014-08-06 ENCOUNTER — Encounter (HOSPITAL_BASED_OUTPATIENT_CLINIC_OR_DEPARTMENT_OTHER): Payer: Self-pay | Admitting: Orthopedic Surgery

## 2014-08-09 LAB — POCT HEMOGLOBIN-HEMACUE: Hemoglobin: 14.2 g/dL (ref 13.0–17.0)

## 2014-12-29 ENCOUNTER — Other Ambulatory Visit: Payer: Self-pay | Admitting: Otolaryngology

## 2014-12-29 DIAGNOSIS — IMO0001 Reserved for inherently not codable concepts without codable children: Secondary | ICD-10-CM

## 2014-12-29 DIAGNOSIS — K219 Gastro-esophageal reflux disease without esophagitis: Secondary | ICD-10-CM

## 2015-01-05 ENCOUNTER — Ambulatory Visit
Admission: RE | Admit: 2015-01-05 | Discharge: 2015-01-05 | Disposition: A | Discharge status: Home / Self Care | Payer: BLUE CROSS/BLUE SHIELD | Source: Ambulatory Visit | Attending: Otolaryngology | Admitting: Otolaryngology

## 2015-01-05 DIAGNOSIS — K219 Gastro-esophageal reflux disease without esophagitis: Secondary | ICD-10-CM

## 2015-11-30 ENCOUNTER — Inpatient Hospital Stay (HOSPITAL_COMMUNITY)
Admission: EM | Admit: 2015-11-30 | Discharge: 2015-12-12 | DRG: 385 | Disposition: A | Discharge status: Home / Self Care | Payer: BLUE CROSS/BLUE SHIELD | Attending: Internal Medicine | Admitting: Internal Medicine

## 2015-11-30 ENCOUNTER — Encounter (HOSPITAL_COMMUNITY): Payer: Self-pay | Admitting: Emergency Medicine

## 2015-11-30 ENCOUNTER — Emergency Department (HOSPITAL_COMMUNITY): Payer: BLUE CROSS/BLUE SHIELD

## 2015-11-30 DIAGNOSIS — K631 Perforation of intestine (nontraumatic): Secondary | ICD-10-CM | POA: Diagnosis present

## 2015-11-30 DIAGNOSIS — K5090 Crohn's disease, unspecified, without complications: Secondary | ICD-10-CM

## 2015-11-30 DIAGNOSIS — K50814 Crohn's disease of both small and large intestine with abscess: Principal | ICD-10-CM | POA: Diagnosis present

## 2015-11-30 DIAGNOSIS — K567 Ileus, unspecified: Secondary | ICD-10-CM | POA: Diagnosis present

## 2015-11-30 DIAGNOSIS — K219 Gastro-esophageal reflux disease without esophagitis: Secondary | ICD-10-CM | POA: Diagnosis present

## 2015-11-30 DIAGNOSIS — K5000 Crohn's disease of small intestine without complications: Secondary | ICD-10-CM | POA: Diagnosis present

## 2015-11-30 DIAGNOSIS — D696 Thrombocytopenia, unspecified: Secondary | ICD-10-CM | POA: Diagnosis not present

## 2015-11-30 DIAGNOSIS — K50114 Crohn's disease of large intestine with abscess: Secondary | ICD-10-CM | POA: Diagnosis not present

## 2015-11-30 DIAGNOSIS — K50014 Crohn's disease of small intestine with abscess: Secondary | ICD-10-CM | POA: Diagnosis not present

## 2015-11-30 DIAGNOSIS — Z79899 Other long term (current) drug therapy: Secondary | ICD-10-CM | POA: Diagnosis not present

## 2015-11-30 DIAGNOSIS — A419 Sepsis, unspecified organism: Secondary | ICD-10-CM | POA: Diagnosis not present

## 2015-11-30 DIAGNOSIS — Z86711 Personal history of pulmonary embolism: Secondary | ICD-10-CM | POA: Diagnosis not present

## 2015-11-30 DIAGNOSIS — L299 Pruritus, unspecified: Secondary | ICD-10-CM | POA: Diagnosis not present

## 2015-11-30 DIAGNOSIS — L0291 Cutaneous abscess, unspecified: Secondary | ICD-10-CM

## 2015-11-30 DIAGNOSIS — Z7982 Long term (current) use of aspirin: Secondary | ICD-10-CM

## 2015-11-30 DIAGNOSIS — Z87891 Personal history of nicotine dependence: Secondary | ICD-10-CM

## 2015-11-30 DIAGNOSIS — R1033 Periumbilical pain: Secondary | ICD-10-CM | POA: Diagnosis present

## 2015-11-30 DIAGNOSIS — K210 Gastro-esophageal reflux disease with esophagitis: Secondary | ICD-10-CM | POA: Diagnosis not present

## 2015-11-30 LAB — URINALYSIS, ROUTINE W REFLEX MICROSCOPIC
Bilirubin Urine: NEGATIVE
Glucose, UA: NEGATIVE mg/dL
Hgb urine dipstick: NEGATIVE
Ketones, ur: NEGATIVE mg/dL
Leukocytes, UA: NEGATIVE
Nitrite: NEGATIVE
Protein, ur: NEGATIVE mg/dL
Specific Gravity, Urine: >1.046 — ABNORMAL HIGH (ref 1.005–1.030)
pH: 6 (ref 5.0–8.0)

## 2015-11-30 LAB — BASIC METABOLIC PANEL
Anion gap: 9 (ref 5–15)
BUN: 17 mg/dL (ref 6–20)
CO2: 28 mmol/L (ref 22–32)
Calcium: 8.6 mg/dL — ABNORMAL LOW (ref 8.9–10.3)
Chloride: 104 mmol/L (ref 101–111)
Creatinine, Ser: 1.08 mg/dL (ref 0.61–1.24)
GFR calc Af Amer: >60 mL/min (ref >60)
GFR calc non Af Amer: >60 mL/min (ref >60)
Glucose, Bld: 108 mg/dL — ABNORMAL HIGH (ref 65–99)
Potassium: 3.5 mmol/L (ref 3.5–5.1)
Sodium: 141 mmol/L (ref 135–145)

## 2015-11-30 LAB — CBC WITH DIFFERENTIAL/PLATELET
Basophils Absolute: 0 10*3/uL (ref 0.0–0.1)
Basophils Relative: 0 %
Eosinophils Absolute: 0.2 10*3/uL (ref 0.0–0.7)
Eosinophils Relative: 2 %
HCT: 48.9 % (ref 39.0–52.0)
Hemoglobin: 16.3 g/dL (ref 13.0–17.0)
Lymphocytes Relative: 8 %
Lymphs Abs: 0.7 10*3/uL (ref 0.7–4.0)
MCH: 29.5 pg (ref 26.0–34.0)
MCHC: 33.3 g/dL (ref 30.0–36.0)
MCV: 88.4 fL (ref 78.0–100.0)
Monocytes Absolute: 0.6 10*3/uL (ref 0.1–1.0)
Monocytes Relative: 7 %
Neutro Abs: 7 10*3/uL (ref 1.7–7.7)
Neutrophils Relative %: 83 %
Platelets: 87 10*3/uL — ABNORMAL LOW (ref 150–400)
RBC: 5.53 MIL/uL (ref 4.22–5.81)
RDW: 13.3 % (ref 11.5–15.5)
WBC: 8.5 10*3/uL (ref 4.0–10.5)

## 2015-11-30 LAB — GLUCOSE, CAPILLARY: Glucose-Capillary: 87 mg/dL (ref 65–99)

## 2015-11-30 MED ORDER — FLUTICASONE PROPIONATE 50 MCG/ACT NA SUSP
1.0000 | Freq: Every day | NASAL | Status: DC
  Administered 2015-12-05 – 2015-12-12 (×8): 1 via NASAL
  Filled 2015-11-30: qty 16

## 2015-11-30 MED ORDER — PIPERACILLIN-TAZOBACTAM 3.375 G IVPB 30 MIN
3.3750 g | Freq: Once | INTRAVENOUS | Status: AC
  Administered 2015-11-30: 3.375 g via INTRAVENOUS

## 2015-11-30 MED ORDER — ONDANSETRON HCL 4 MG/2ML IJ SOLN
4.0000 mg | Freq: Once | INTRAMUSCULAR | Status: AC
  Administered 2015-11-30: 4 mg via INTRAVENOUS
  Filled 2015-11-30: qty 2

## 2015-11-30 MED ORDER — ACETAMINOPHEN 325 MG PO TABS: 650.0000 mg | ORAL_TABLET | Freq: Four times a day (QID) | ORAL | Status: DC | PRN

## 2015-11-30 MED ORDER — IOHEXOL 300 MG/ML  SOLN
100.0000 mL | Freq: Once | INTRAMUSCULAR | Status: AC | PRN
  Administered 2015-11-30: 100 mL via INTRAVENOUS

## 2015-11-30 MED ORDER — ONDANSETRON HCL 4 MG/2ML IJ SOLN: 4.0000 mg | Freq: Four times a day (QID) | INTRAMUSCULAR | Status: DC | PRN

## 2015-11-30 MED ORDER — ACETAMINOPHEN 650 MG RE SUPP
650.0000 mg | Freq: Four times a day (QID) | RECTAL | Status: DC | PRN
Start: 2015-11-30 — End: 2015-12-12
  Administered 2015-12-01: 650 mg via RECTAL
  Filled 2015-11-30: qty 1

## 2015-11-30 MED ORDER — MORPHINE SULFATE (PF) 4 MG/ML IV SOLN
4.0000 mg | INTRAVENOUS | Status: DC | PRN
  Administered 2015-11-30 (×2): 4 mg via INTRAVENOUS
  Filled 2015-11-30 (×2): qty 1

## 2015-11-30 MED ORDER — MORPHINE SULFATE (PF) 2 MG/ML IV SOLN
1.0000 mg | INTRAVENOUS | Status: DC | PRN
  Administered 2015-12-01 – 2015-12-05 (×13): 1 mg via INTRAVENOUS
  Filled 2015-11-30 (×14): qty 1

## 2015-11-30 MED ORDER — IOHEXOL 300 MG/ML  SOLN
25.0000 mL | Freq: Once | INTRAMUSCULAR | Status: AC | PRN
  Administered 2015-11-30: 25 mL via ORAL

## 2015-11-30 MED ORDER — SODIUM CHLORIDE 0.9 % IV SOLN
Freq: Once | INTRAVENOUS | Status: AC
  Administered 2015-11-30: 18:00:00 via INTRAVENOUS

## 2015-11-30 MED ORDER — PIPERACILLIN-TAZOBACTAM 3.375 G IVPB
3.3750 g | Freq: Once | INTRAVENOUS | Status: DC
  Filled 2015-11-30: qty 50

## 2015-11-30 MED ORDER — SODIUM CHLORIDE 0.9 % IV BOLUS (SEPSIS)
500.0000 mL | Freq: Once | INTRAVENOUS | Status: AC
Start: 2015-11-30 — End: 2015-11-30
  Administered 2015-11-30: 500 mL via INTRAVENOUS

## 2015-11-30 MED ORDER — DEXTROSE-NACL 5-0.9 % IV SOLN
INTRAVENOUS | Status: AC
  Administered 2015-11-30 – 2015-12-01 (×4): via INTRAVENOUS

## 2015-11-30 MED ORDER — ONDANSETRON HCL 4 MG PO TABS: 4.0000 mg | ORAL_TABLET | Freq: Four times a day (QID) | ORAL | Status: DC | PRN

## 2015-11-30 NOTE — H&P (Addendum)
[facility] History and Physical  Nathan Shaw [id number] DOB: 10/20/1955 DOA: 11/30/2015  Referring physician: Dr. Jeneen Rinks. PCP: Gennette Pac, MD  Specialists: Dr. Earle Gell. Gastroenterologist.  Chief Complaint: Abdominal pain.  HPI: Nathan Shaw is a 60 y.o. male with history of Crohn's disease in remission last 2 years and is off medications presents to the ER because of worsening abdominal pain. Patient's symptoms started 3 days ago with nausea vomiting and diarrhea. Yesterday patient started having abdominal pain which was initially periumbilical became more towards the lower quadrants. CT abdomen and pelvis shows distal ileal perforation with possible abscess formation. On-call surgeon Dr. Leighton Ruff was notified and patient will be admitted for further management. Patient otherwise denies any chest pain or shortness of breath. Has had some subjective feeling of fever chills. Denies any recent use of antibiotics.   Review of Systems: As presented in the history of presenting illness, rest negative.  Past Medical History  Diagnosis Date  . Crohn disease ([place]) last flare up dec 2014/ jan 2015  . Wears contact lenses    Past Surgical History  Procedure Laterality Date  . Lung biopsy  1997     was infection  . Colonoscopy with propofol N/A 02/16/2014    Procedure: COLONOSCOPY WITH PROPOFOL;  Surgeon: Garlan Fair, MD;  Location: [facility];  Service: Endoscopy;  Laterality: N/A;  . Carpal tunnel release Right 08/05/2014    Procedure: RIGHT CARPAL TUNNEL RELEASE;  Surgeon: Daryll Brod, MD;  Location: [place];  Service: Orthopedics;  Laterality: Right;   Social History:  reports that he quit smoking about 12 years ago. He has never used smokeless tobacco. He reports that he drinks alcohol. He reports that he does not use illicit drugs. Where does patient live Home. Can patient participate in ADLs? Yes.  No Known Allergies  Family History:   Family History  Problem Relation Age of Onset  . Crohn's disease Sister   . Hypertension Neg Hx   . Diabetes Mellitus II Neg Hx       Prior to Admission medications   Medication Sig Start Date End Date Taking? Authorizing Provider  aspirin EC 81 MG tablet Take 81 mg by mouth daily.   Yes Historical Provider, MD  calcium carbonate (OS-CAL) 600 MG TABS tablet Take 600 mg by mouth daily with breakfast.   Yes Historical Provider, MD  cetirizine (ZYRTEC) 10 MG tablet Take 10 mg by mouth daily.   Yes Historical Provider, MD  cholecalciferol (VITAMIN D) 1000 UNITS tablet Take 1,000 Units by mouth daily.   Yes Historical Provider, MD  fluticasone (FLONASE) 50 MCG/ACT nasal spray Place 1 spray into both nostrils daily.   Yes Historical Provider, MD  Misc Natural Products (GLUCOS-CHONDROIT-MSM COMPLEX PO) Take 1 tablet by mouth daily.    Yes Historical Provider, MD  Multiple Vitamin (MULTIVITAMIN WITH MINERALS) TABS tablet Take 1 tablet by mouth daily.   Yes Historical Provider, MD  omega-3 acid ethyl esters (LOVAZA) 1 G capsule Take 1 g by mouth daily.   Yes Historical Provider, MD  omeprazole (PRILOSEC) 20 MG capsule Take 20 mg by mouth daily.   Yes Historical Provider, MD  sildenafil (REVATIO) 20 MG tablet Take 20 mg by mouth daily as needed (erectile dysfunction).   Yes Historical Provider, MD  testosterone (ANDROGEL) 50 MG/5GM (1%) GEL Place 5 g onto the skin daily.   Yes Historical Provider, MD  valACYclovir (VALTREX) 500 MG tablet Take 500 mg by mouth daily.  Yes Historical Provider, MD  Cyanocobalamin (VITAMIN B-12 IJ) Inject as directed every 30 (thirty) days.    Historical Provider, MD  HYDROcodone-acetaminophen (NORCO) 5-325 MG per tablet Take 1 tablet by mouth every 6 (six) hours as needed for moderate pain. Patient not taking: Reported on 11/30/2015 08/05/14   Daryll Brod, MD    Physical Exam: Filed Vitals:   11/30/15 1830 11/30/15 1952 11/30/15 2000 11/30/15 2127  BP: 109/81 108/78  111/75 108/71  Pulse: 81 94 99 99  Temp:      TempSrc:      Resp:   16 18  SpO2: 98% 98% 99% 96%     General:  Moderately built and nourished.  Eyes: Anicteric no pallor.  ENT: No discharge from the ears eyes nose or mouth.  Neck: No mass felt.  Cardiovascular: S1 and S2 heard.  Respiratory: No rhonchi or crepitations.  Abdomen: Soft mild tenderness in the lower quadrant. No guarding or rigidity.  Skin: No rash.  Musculoskeletal: No edema.  Psychiatric: Appears normal.  Neurologic: Alert awake oriented to time place and person. Moves all extremities.  Labs on Admission:  Basic Metabolic Panel:  Recent Labs Lab 11/30/15 1739  NA 141  K 3.5  CL 104  CO2 28  GLUCOSE 108*  BUN 17  CREATININE 1.08  CALCIUM 8.6*   Liver Function Tests: No results for input(s): AST, ALT, ALKPHOS, BILITOT, PROT, ALBUMIN in the last 168 hours. No results for input(s): LIPASE, AMYLASE in the last 168 hours. No results for input(s): AMMONIA in the last 168 hours. CBC:  Recent Labs Lab 11/30/15 1739  WBC 8.5  NEUTROABS 7.0  HGB 16.3  HCT 48.9  MCV 88.4  PLT 87*   Cardiac Enzymes: No results for input(s): CKTOTAL, CKMB, CKMBINDEX, TROPONINI in the last 168 hours.  BNP (last 3 results) No results for input(s): BNP in the last 8760 hours.  ProBNP (last 3 results) No results for input(s): PROBNP in the last 8760 hours.  CBG: No results for input(s): GLUCAP in the last 168 hours.  Radiological Exams on Admission: Ct Abdomen Pelvis W Contrast  11/30/2015  CLINICAL DATA:  Pt from PCP for possible appendicitis, co abd pain radiating to RLQ, fever x 3 days , WBC 10.3. denies nausea nor diarrhea today yet reports nausea 3 days ago. Alert and oriented x 4, no obvious distress at this time. History of Crohn's disease. EXAM: CT ABDOMEN AND PELVIS WITH CONTRAST TECHNIQUE: Multidetector CT imaging of the abdomen and pelvis was performed using the standard protocol following bolus  administration of intravenous contrast. CONTRAST:  65mL OMNIPAQUE IOHEXOL 300 MG/ML SOLN, 190mL OMNIPAQUE IOHEXOL 300 MG/ML SOLN COMPARISON:  CT 10/21/2009 FINDINGS: Lower chest: Lung bases are clear. Hepatobiliary: No focal hepatic lesion. No biliary duct dilatation. Gallbladder is normal. Common bile duct is normal. Pancreas: Pancreas is normal. No ductal dilatation. No pancreatic inflammation. Spleen: Normal spleen Adrenals/urinary tract: Adrenal glands and kidneys are normal. The ureters and bladder normal. Stomach/Bowel: Stomach and duodenum are normal. There is poor progression of the oral contrast through the small bowel. There is a long segment of bowel wall inflammation involving the distal ileum leading up to the terminal ileum with circumferential bowel wall thickening and some mild stricturing. This is a finding of small bowel inflammation i ssimilar to CT of 2011 Adjacent to this distal small bowel inflammation, there is a contained gas and fluid collection measuring 3.5 x 3.6 x 4.2 cm (image 76, series 2). This is adjacent  and along the mesenteric border of the distal ileum approximately 7 cm from the terminal ileum. There is no clear evidence of fistulous connection to additional loops of bowel or other structures. The cecum is normal. The appendix not identified. Appendix not identified on CT of 10/21/2009 either. More proximally in the small bowel regions of stricturing and small bowel dilatation as seen on image 32, series 5. Long segment of stricturing on image 40 series 5. There is pre and post stenotic dilatation associated with this mid small bowel lesion. The ascending, transverse descending, and sigmoid colon are normal. Vascular/Lymphatic: Abdominal aorta is normal caliber with atherosclerotic calcification. There is no retroperitoneal or periportal lymphadenopathy. No pelvic lymphadenopathy. Reproductive: Prostate normal Other: No free fluid. Musculoskeletal: No aggressive osseous lesion.  IMPRESSION: 1. Gas and fluid collection along the mesenteric border the inflamed distal ileum consistent with small perforation with potential early abscess formation. No clear fistulous communication to other bowel structures. The findings consistent sequelae acute Crohn's inflammation. 2. Long segment of chronic distal ileal inflammation and stricturing. 3. Multiple additional foci of stricturing throughout the small bowel with pre and posts tenotic small dilatation. No evidence of high-grade obstruction. 4. Appendix not identified on this scan or scan from 2011. 5.  Atherosclerotic calcification of the abdominal aorta. Findings conveyed Nanci Pina on 11/30/2015  at19:33. Electronically Signed   By: Suzy Bouchard M.D.   On: 11/30/2015 19:33     Assessment/Plan Active Problems:   Crohn's colitis ([place])   Small bowel perforation ([place])   1. Crohn's colitis with distal ileal small perforation with possible early abscess formation - I have discussed with on-call surgeon Dr. Reeves Forth will be seeing patient in consult. Will request gastroenterology consult in a.m. Patient is known to Dr. Earle Gell gastroenterologist. At this time patient will be kept nothing by mouth and on empiric antibiotics. Continue with IV fluids and pain relief medications. 2. Previous history of pulmonary embolism per chart.  Addendum - consulted Gastroenterologist Dr.Outlaw.   DVT Prophylaxis SCDs.  Code Status: Full code.  Family Communication: Patient's wife at the bedside.  Disposition Plan: Admit to inpatient.    Guerin Lashomb N. [facility] Pager [phone].  If 7PM-7AM, please contact night-coverage [email or web address] Password [facility] 11/30/2015, 10:37 PM

## 2015-11-30 NOTE — ED Notes (Signed)
Patient given urinal and informed of the need for urine.

## 2015-11-30 NOTE — ED Notes (Signed)
Pt from PCP for possible appendicitis, co abd pain radiating to RLQ, fever x 3 days , WBC 10.3. denies nausea nor diarrhea today yet reports nausea 3 days ago. Alert and oriented x 4, no obvious distress at this time.

## 2015-11-30 NOTE — ED Notes (Signed)
Bed: WA08 Expected date:  Expected time:  Means of arrival:  Comments: Hold for fast track

## 2015-12-01 LAB — COMPREHENSIVE METABOLIC PANEL
ALT: 30 U/L (ref 17–63)
AST: 25 U/L (ref 15–41)
Albumin: 2.3 g/dL — ABNORMAL LOW (ref 3.5–5.0)
Alkaline Phosphatase: 58 U/L (ref 38–126)
Anion gap: 6 (ref 5–15)
BUN: 13 mg/dL (ref 6–20)
CO2: 26 mmol/L (ref 22–32)
Calcium: 7.7 mg/dL — ABNORMAL LOW (ref 8.9–10.3)
Chloride: 110 mmol/L (ref 101–111)
Creatinine, Ser: 1.07 mg/dL (ref 0.61–1.24)
GFR calc Af Amer: >60 mL/min (ref >60)
GFR calc non Af Amer: >60 mL/min (ref >60)
Glucose, Bld: 112 mg/dL — ABNORMAL HIGH (ref 65–99)
Potassium: 3.5 mmol/L (ref 3.5–5.1)
Sodium: 142 mmol/L (ref 135–145)
Total Bilirubin: 1.3 mg/dL — ABNORMAL HIGH (ref 0.3–1.2)
Total Protein: 4.9 g/dL — ABNORMAL LOW (ref 6.5–8.1)

## 2015-12-01 LAB — CBC WITH DIFFERENTIAL/PLATELET
Basophils Absolute: 0 10*3/uL (ref 0.0–0.1)
Basophils Relative: 0 %
Eosinophils Absolute: 0.1 10*3/uL (ref 0.0–0.7)
Eosinophils Relative: 1 %
HCT: 42.3 % (ref 39.0–52.0)
Hemoglobin: 13.8 g/dL (ref 13.0–17.0)
Lymphocytes Relative: 11 %
Lymphs Abs: 0.6 10*3/uL — ABNORMAL LOW (ref 0.7–4.0)
MCH: 29.4 pg (ref 26.0–34.0)
MCHC: 32.6 g/dL (ref 30.0–36.0)
MCV: 90.2 fL (ref 78.0–100.0)
Monocytes Absolute: 0.5 10*3/uL (ref 0.1–1.0)
Monocytes Relative: 8 %
Neutro Abs: 4.4 10*3/uL (ref 1.7–7.7)
Neutrophils Relative %: 79 %
Platelets: 78 10*3/uL — ABNORMAL LOW (ref 150–400)
RBC: 4.69 MIL/uL (ref 4.22–5.81)
RDW: 13.6 % (ref 11.5–15.5)
WBC: 5.5 10*3/uL (ref 4.0–10.5)

## 2015-12-01 LAB — GLUCOSE, CAPILLARY
Glucose-Capillary: 111 mg/dL — ABNORMAL HIGH (ref 65–99)
Glucose-Capillary: 67 mg/dL (ref 65–99)
Glucose-Capillary: 69 mg/dL (ref 65–99)
Glucose-Capillary: 87 mg/dL (ref 65–99)
Glucose-Capillary: 89 mg/dL (ref 65–99)
Glucose-Capillary: 89 mg/dL (ref 65–99)

## 2015-12-01 MED ORDER — PHENOL 1.4 % MT LIQD: 2.0000 | OROMUCOSAL | Status: DC | PRN

## 2015-12-01 MED ORDER — PIPERACILLIN-TAZOBACTAM 3.375 G IVPB
3.3750 g | Freq: Three times a day (TID) | INTRAVENOUS | Status: DC
  Administered 2015-12-01 (×2): 3.375 g via INTRAVENOUS
  Filled 2015-12-01 (×3): qty 50

## 2015-12-01 MED ORDER — LACTATED RINGERS IV BOLUS (SEPSIS)
1000.0000 mL | Freq: Once | INTRAVENOUS | Status: AC
  Administered 2015-12-01: 1000 mL via INTRAVENOUS

## 2015-12-01 MED ORDER — CIPROFLOXACIN IN D5W 400 MG/200ML IV SOLN
400.0000 mg | Freq: Two times a day (BID) | INTRAVENOUS | Status: DC
  Administered 2015-12-01 – 2015-12-05 (×8): 400 mg via INTRAVENOUS
  Filled 2015-12-01 (×8): qty 200

## 2015-12-01 MED ORDER — ALUM & MAG HYDROXIDE-SIMETH 200-200-20 MG/5ML PO SUSP: 30.0000 mL | Freq: Four times a day (QID) | ORAL | Status: DC | PRN

## 2015-12-01 MED ORDER — METRONIDAZOLE IN NACL 5-0.79 MG/ML-% IV SOLN
500.0000 mg | Freq: Four times a day (QID) | INTRAVENOUS | Status: DC
  Administered 2015-12-01 – 2015-12-05 (×15): 500 mg via INTRAVENOUS
  Filled 2015-12-01 (×16): qty 100

## 2015-12-01 MED ORDER — DIPHENHYDRAMINE HCL 50 MG/ML IJ SOLN
12.5000 mg | Freq: Four times a day (QID) | INTRAMUSCULAR | Status: DC | PRN
  Administered 2015-12-05: 12.5 mg via INTRAVENOUS
  Filled 2015-12-01: qty 1

## 2015-12-01 MED ORDER — MAGIC MOUTHWASH
15.0000 mL | Freq: Four times a day (QID) | ORAL | Status: DC | PRN
  Filled 2015-12-01: qty 15

## 2015-12-01 MED ORDER — MENTHOL 3 MG MT LOZG: 1.0000 | LOZENGE | OROMUCOSAL | Status: DC | PRN

## 2015-12-01 MED ORDER — LACTATED RINGERS IV BOLUS (SEPSIS): 1000.0000 mL | Freq: Three times a day (TID) | INTRAVENOUS | Status: DC | PRN

## 2015-12-01 MED ORDER — PROMETHAZINE HCL 25 MG/ML IJ SOLN: 6.2500 mg | INTRAMUSCULAR | Status: DC | PRN

## 2015-12-01 MED ORDER — LIP MEDEX EX OINT
1.0000 "application " | TOPICAL_OINTMENT | Freq: Two times a day (BID) | CUTANEOUS | Status: DC
  Administered 2015-12-01 – 2015-12-12 (×22): 1 via TOPICAL
  Filled 2015-12-01 (×3): qty 7

## 2015-12-01 MED ORDER — DEXTROSE 50 % IV SOLN
INTRAVENOUS | Status: AC
Start: 2015-12-01 — End: 2015-12-01
  Administered 2015-12-01: 25 mL
  Filled 2015-12-01: qty 50

## 2015-12-01 NOTE — Progress Notes (Signed)
Initial Nutrition Assessment  INTERVENTION:   Diet advancement per MD RD to continue to monitor for nutritional needs  NUTRITION DIAGNOSIS:   Inadequate oral intake related to nausea, vomiting as evidenced by per patient/family report.  GOAL:   Patient will meet greater than or equal to 90% of their needs  MONITOR:   Diet advancement, Labs, Weight trends, I & O's  REASON FOR ASSESSMENT:   Malnutrition Screening Tool    ASSESSMENT:   60 y/o white male with PMH Crohn's disease in remission last 2 years and off medications presents to [facility] because of worsening abdominal pain. Patient's symptoms started Monday with N/V/D, fever/chills. He thought he had the flu. On 11/30/15 the patient started having 5/10 intermittent aching abdominal pain which was initially periumbilical but migrated to the lower quadrants. Last BM was yesterday.   Patient currently NPO for bowel rest. Reports N/V x 2-3 days PTA. Prior to these symptoms, pt was eating normally with no appetite changes. Pt reports UBW of 180-190lb, wt stable. Surgery following, if diet is advanced, will assess for nutritional needs. No signs of muscle or fat wasting.  Labs reviewed. Medications reviewed.  Diet Order:  Diet NPO time specified Except for: Ice Chips, Sips with Meds  Skin:  Reviewed, no issues  Last BM:  3/1  Height:   Ht Readings from Last 1 Encounters:  11/30/15 5\' 11"  (1.803 m)    Weight:   Wt Readings from Last 1 Encounters:  11/30/15 184 lb (83.462 kg)    Ideal Body Weight:  78.2 kg  BMI:  Body mass index is 25.67 kg/(m^2).  Estimated Nutritional Needs:   Kcal:  1900-2100  Protein:  95-105g  Fluid:  2L/day  EDUCATION NEEDS:   No education needs identified at this time  Clayton Bibles, MS, RD, LDN Pager: [phone] After Hours Pager: [phone]

## 2015-12-01 NOTE — Progress Notes (Signed)
Hypoglycemic Event  CBG: 67 @ 1240  Treatment: D50 IV 25 mL @ 1248  Symptoms: None  Follow-up CBG: Time: 1320 CBG Result: 111  Possible Reasons for Event: Inadequate meal intake-Pt NPO  Comments/MD notified: Dr. Hartford Poli aware via phone of recent low CBG's. Pt remains asymptomatic. No new orders received. Md wishes for current IVF of D5NS to infuse @ rate ordered.  Md also made aware of spiked temperature of 102.8. Dr. Hartford Poli instructed to give Tylenol suppository as ordered. No new orders at this time.    Derek Jack

## 2015-12-01 NOTE — Consult Note (Signed)
[facility] CONSULT Reason for consult: Crohn's disease with perforation Referring Physician: [facility]. PCP: Dr. Rex Kras. Primary G.I.: Dr. Daron Offer Nathan Shaw is an 60 y.o. male.  HPI: he has been followed for a number of years initially by Dr. Sammuel Cooper and subsequently by Dr. Wynetta Emery with Crohn's disease. He reports that he has had extensive Crohn's of the small bowel in the past as well as a question of Crohn's disease of the duodenum based on upper G.I. series and small bowel series. He never had EGD to confirm Crohn's and the duodenum. He apparently has been doing fairly well and is not been on any chronic medication for Crohn's. He had colonoscopy 5/15 with no active disease seen in the right colon with biopsy showing minimal inflammation. Adenomatous polyp was removed. He notes that he tends to have loose stools 2 to 4 per day but no blood in no trouble with his weight. He does not have nocturnal stools very frequently. He presented to his PCPs office with severe pain and was sent to the emergency room. The pain came on fairly slowly over several hours. CT scan of the abdomen and pelvis showed extensive Crohn's disease with a perforation and contained abscess in the distal ileum. The patient is in PO except ice chips is on  IV Zosyn. Were asked to see him to recommend any other treatment. He has been seen by surgery and is felt that he can be treated medically at this time with bowel rest and antibiotics with possible percutaneous drain of abscess. It is notable that the CT scan shows a long segment of involvement in the terminal ilium with probable structuring with the abscessed area around the segment. There is no clear fistulas track demonstrated. There is also some evidence of narrowing in the more proximal small bowel as well.  Past Medical History  Diagnosis Date  . Crohn disease ([place]) last flare up dec 2014/ jan 2015  . Wears contact lenses     Past Surgical History   Procedure Laterality Date  . Lung biopsy  1997     was infection  . Colonoscopy with propofol N/A 02/16/2014    Procedure: COLONOSCOPY WITH PROPOFOL;  Surgeon: Garlan Fair, MD;  Location: [facility];  Service: Endoscopy;  Laterality: N/A;  . Carpal tunnel release Right 08/05/2014    Procedure: RIGHT CARPAL TUNNEL RELEASE;  Surgeon: Daryll Brod, MD;  Location: [facility];  Service: Orthopedics;  Laterality: Right;    Family History  Problem Relation Age of Onset  . Crohn's disease Sister   . Hypertension Neg Hx   . Diabetes Mellitus II Neg Hx     Social History:  reports that he quit smoking about 12 years ago. He has never used smokeless tobacco. He reports that he drinks alcohol. He reports that he does not use illicit drugs.  Allergies: No Known Allergies  Medications; Prior to Admission medications   Medication Sig Start Date End Date Taking? Authorizing Provider  aspirin EC 81 MG tablet Take 81 mg by mouth daily.   Yes Historical Provider, MD  calcium carbonate (OS-CAL) 600 MG TABS tablet Take 600 mg by mouth daily with breakfast.   Yes Historical Provider, MD  cetirizine (ZYRTEC) 10 MG tablet Take 10 mg by mouth daily.   Yes Historical Provider, MD  cholecalciferol (VITAMIN D) 1000 UNITS tablet Take 1,000 Units by mouth daily.   Yes Historical Provider, MD  fluticasone (FLONASE) 50 MCG/ACT nasal spray Place 1 spray  into both nostrils daily.   Yes Historical Provider, MD  Misc Natural Products (GLUCOS-CHONDROIT-MSM COMPLEX PO) Take 1 tablet by mouth daily.    Yes Historical Provider, MD  Multiple Vitamin (MULTIVITAMIN WITH MINERALS) TABS tablet Take 1 tablet by mouth daily.   Yes Historical Provider, MD  omega-3 acid ethyl esters (LOVAZA) 1 G capsule Take 1 g by mouth daily.   Yes Historical Provider, MD  omeprazole (PRILOSEC) 20 MG capsule Take 20 mg by mouth daily.   Yes Historical Provider, MD  sildenafil (REVATIO) 20 MG tablet Take 20 mg by mouth daily as  needed (erectile dysfunction).   Yes Historical Provider, MD  testosterone (ANDROGEL) 50 MG/5GM (1%) GEL Place 5 g onto the skin daily.   Yes Historical Provider, MD  valACYclovir (VALTREX) 500 MG tablet Take 500 mg by mouth daily.   Yes Historical Provider, MD  Cyanocobalamin (VITAMIN B-12 IJ) Inject as directed every 30 (thirty) days.    Historical Provider, MD  HYDROcodone-acetaminophen (NORCO) 5-325 MG per tablet Take 1 tablet by mouth every 6 (six) hours as needed for moderate pain. Patient not taking: Reported on 11/30/2015 08/05/14   Daryll Brod, MD   . fluticasone  1 spray Each Nare Daily  . lip balm  1 application Topical BID  . piperacillin-tazobactam (ZOSYN)  IV  3.375 g Intravenous 3 times per day   PRN Meds acetaminophen **OR** acetaminophen, alum & mag hydroxide-simeth, diphenhydrAMINE, magic mouthwash, menthol-cetylpyridinium, morphine injection, ondansetron **OR** ondansetron (ZOFRAN) IV, phenol, promethazine Results for orders placed or performed during the hospital encounter of 11/30/15 (from the past 48 hour(s))  CBC with Differential/Platelet     Status: Abnormal   Collection Time: 11/30/15  5:39 PM  Result Value Ref Range   WBC 8.5 4.0 - 10.5 K/uL   RBC 5.53 4.22 - 5.81 MIL/uL   Hemoglobin 16.3 13.0 - 17.0 g/dL   HCT 48.9 39.0 - 52.0 %   MCV 88.4 78.0 - 100.0 fL   MCH 29.5 26.0 - 34.0 pg   MCHC 33.3 30.0 - 36.0 g/dL   RDW 13.3 11.5 - 15.5 %   Platelets 87 (L) 150 - 400 K/uL    Comment: SPECIMEN CHECKED FOR CLOTS REPEATED TO VERIFY PLATELET COUNT CONFIRMED BY SMEAR    Neutrophils Relative % 83 %   Lymphocytes Relative 8 %   Monocytes Relative 7 %   Eosinophils Relative 2 %   Basophils Relative 0 %   Neutro Abs 7.0 1.7 - 7.7 K/uL   Lymphs Abs 0.7 0.7 - 4.0 K/uL   Monocytes Absolute 0.6 0.1 - 1.0 K/uL   Eosinophils Absolute 0.2 0.0 - 0.7 K/uL   Basophils Absolute 0.0 0.0 - 0.1 K/uL   WBC Morphology DOHLE BODIES   Basic metabolic panel     Status: Abnormal    Collection Time: 11/30/15  5:39 PM  Result Value Ref Range   Sodium 141 135 - 145 mmol/L   Potassium 3.5 3.5 - 5.1 mmol/L   Chloride 104 101 - 111 mmol/L   CO2 28 22 - 32 mmol/L   Glucose, Bld 108 (H) 65 - 99 mg/dL   BUN 17 6 - 20 mg/dL   Creatinine, Ser 1.08 0.61 - 1.24 mg/dL   Calcium 8.6 (L) 8.9 - 10.3 mg/dL   GFR calc non Af Amer >60 >60 mL/min   GFR calc Af Amer >60 >60 mL/min    Comment: (NOTE) The eGFR has been calculated using the CKD EPI equation. This calculation has  not been validated in all clinical situations. eGFR's persistently <60 mL/min signify possible Chronic Kidney Disease.    Anion gap 9 5 - 15  Urinalysis, Routine w reflex microscopic (not at [facility])     Status: Abnormal   Collection Time: 11/30/15  7:49 PM  Result Value Ref Range   Color, Urine AMBER (A) YELLOW    Comment: BIOCHEMICALS MAY BE AFFECTED BY COLOR   APPearance CLEAR CLEAR   Specific Gravity, Urine >1.046 (H) 1.005 - 1.030   pH 6.0 5.0 - 8.0   Glucose, UA NEGATIVE NEGATIVE mg/dL   Hgb urine dipstick NEGATIVE NEGATIVE   Bilirubin Urine NEGATIVE NEGATIVE   Ketones, ur NEGATIVE NEGATIVE mg/dL   Protein, ur NEGATIVE NEGATIVE mg/dL   Nitrite NEGATIVE NEGATIVE   Leukocytes, UA NEGATIVE NEGATIVE    Comment: MICROSCOPIC NOT DONE ON URINES WITH NEGATIVE PROTEIN, BLOOD, LEUKOCYTES, NITRITE, OR GLUCOSE <1000 mg/dL.  Glucose, capillary     Status: None   Collection Time: 11/30/15 10:54 PM  Result Value Ref Range   Glucose-Capillary 87 65 - 99 mg/dL  Comprehensive metabolic panel     Status: Abnormal   Collection Time: 12/01/15  4:34 AM  Result Value Ref Range   Sodium 142 135 - 145 mmol/L   Potassium 3.5 3.5 - 5.1 mmol/L   Chloride 110 101 - 111 mmol/L   CO2 26 22 - 32 mmol/L   Glucose, Bld 112 (H) 65 - 99 mg/dL   BUN 13 6 - 20 mg/dL   Creatinine, Ser 1.07 0.61 - 1.24 mg/dL   Calcium 7.7 (L) 8.9 - 10.3 mg/dL   Total Protein 4.9 (L) 6.5 - 8.1 g/dL   Albumin 2.3 (L) 3.5 - 5.0 g/dL   AST 25 15 -  41 U/L   ALT 30 17 - 63 U/L   Alkaline Phosphatase 58 38 - 126 U/L   Total Bilirubin 1.3 (H) 0.3 - 1.2 mg/dL   GFR calc non Af Amer >60 >60 mL/min   GFR calc Af Amer >60 >60 mL/min    Comment: (NOTE) The eGFR has been calculated using the CKD EPI equation. This calculation has not been validated in all clinical situations. eGFR's persistently <60 mL/min signify possible Chronic Kidney Disease.    Anion gap 6 5 - 15  CBC WITH DIFFERENTIAL     Status: Abnormal   Collection Time: 12/01/15  4:34 AM  Result Value Ref Range   WBC 5.5 4.0 - 10.5 K/uL   RBC 4.69 4.22 - 5.81 MIL/uL   Hemoglobin 13.8 13.0 - 17.0 g/dL   HCT 42.3 39.0 - 52.0 %   MCV 90.2 78.0 - 100.0 fL   MCH 29.4 26.0 - 34.0 pg   MCHC 32.6 30.0 - 36.0 g/dL   RDW 13.6 11.5 - 15.5 %   Platelets 78 (L) 150 - 400 K/uL    Comment: CONSISTENT WITH PREVIOUS RESULT   Neutrophils Relative % 79 %   Neutro Abs 4.4 1.7 - 7.7 K/uL   Lymphocytes Relative 11 %   Lymphs Abs 0.6 (L) 0.7 - 4.0 K/uL   Monocytes Relative 8 %   Monocytes Absolute 0.5 0.1 - 1.0 K/uL   Eosinophils Relative 1 %   Eosinophils Absolute 0.1 0.0 - 0.7 K/uL   Basophils Relative 0 %   Basophils Absolute 0.0 0.0 - 0.1 K/uL  Glucose, capillary     Status: None   Collection Time: 12/01/15  5:22 AM  Result Value Ref Range   Glucose-Capillary 89  65 - 99 mg/dL  Glucose, capillary     Status: None   Collection Time: 12/01/15 12:15 PM  Result Value Ref Range   Glucose-Capillary 69 65 - 99 mg/dL  Glucose, capillary     Status: None   Collection Time: 12/01/15 12:37 PM  Result Value Ref Range   Glucose-Capillary 67 65 - 99 mg/dL  Glucose, capillary     Status: Abnormal   Collection Time: 12/01/15  1:24 PM  Result Value Ref Range   Glucose-Capillary 111 (H) 65 - 99 mg/dL    Ct Abdomen Pelvis W Contrast  11/30/2015  CLINICAL DATA:  Pt from PCP for possible appendicitis, co abd pain radiating to RLQ, fever x 3 days , WBC 10.3. denies nausea nor diarrhea today yet  reports nausea 3 days ago. Alert and oriented x 4, no obvious distress at this time. History of Crohn's disease. EXAM: CT ABDOMEN AND PELVIS WITH CONTRAST TECHNIQUE: Multidetector CT imaging of the abdomen and pelvis was performed using the standard protocol following bolus administration of intravenous contrast. CONTRAST:  95m OMNIPAQUE IOHEXOL 300 MG/ML SOLN, 1025mOMNIPAQUE IOHEXOL 300 MG/ML SOLN COMPARISON:  CT 10/21/2009 FINDINGS: Lower chest: Lung bases are clear. Hepatobiliary: No focal hepatic lesion. No biliary duct dilatation. Gallbladder is normal. Common bile duct is normal. Pancreas: Pancreas is normal. No ductal dilatation. No pancreatic inflammation. Spleen: Normal spleen Adrenals/urinary tract: Adrenal glands and kidneys are normal. The ureters and bladder normal. Stomach/Bowel: Stomach and duodenum are normal. There is poor progression of the oral contrast through the small bowel. There is a long segment of bowel wall inflammation involving the distal ileum leading up to the terminal ileum with circumferential bowel wall thickening and some mild stricturing. This is a finding of small bowel inflammation i ssimilar to CT of 2011 Adjacent to this distal small bowel inflammation, there is a contained gas and fluid collection measuring 3.5 x 3.6 x 4.2 cm (image 76, series 2). This is adjacent and along the mesenteric border of the distal ileum approximately 7 cm from the terminal ileum. There is no clear evidence of fistulous connection to additional loops of bowel or other structures. The cecum is normal. The appendix not identified. Appendix not identified on CT of 10/21/2009 either. More proximally in the small bowel regions of stricturing and small bowel dilatation as seen on image 32, series 5. Long segment of stricturing on image 40 series 5. There is pre and post stenotic dilatation associated with this mid small bowel lesion. The ascending, transverse descending, and sigmoid colon are normal.  Vascular/Lymphatic: Abdominal aorta is normal caliber with atherosclerotic calcification. There is no retroperitoneal or periportal lymphadenopathy. No pelvic lymphadenopathy. Reproductive: Prostate normal Other: No free fluid. Musculoskeletal: No aggressive osseous lesion. IMPRESSION: 1. Gas and fluid collection along the mesenteric border the inflamed distal ileum consistent with small perforation with potential early abscess formation. No clear fistulous communication to other bowel structures. The findings consistent sequelae acute Crohn's inflammation. 2. Long segment of chronic distal ileal inflammation and stricturing. 3. Multiple additional foci of stricturing throughout the small bowel with pre and posts tenotic small dilatation. No evidence of high-grade obstruction. 4. Appendix not identified on this scan or scan from 2011. 5.  Atherosclerotic calcification of the abdominal aorta. Findings conveyed toNanci Pinan 11/30/2015  at19:33. Electronically Signed   By: StSuzy Bouchard.D.   On: 11/30/2015 19:33               Blood pressure 109/64, pulse 59,  temperature 99.3 F (37.4 C), temperature source Oral, resp. rate 16, height '5\' 11"'  (1.803 m), weight 83.462 kg (184 lb), SpO2 98 %.  Physical exam:   General-- muscular healthy appearing white male in no distress ENT-- nonicteric Neck-- no lymphadenopathy Heart-- regular rate and rhythm without murmurs or gallops Lungs-- clear Abdomen-- slight distinction and fairly quiet abdomen. Mild lower tenderness Psych-- alert oriented and answers questions appropriately   Assessment: 1. Terminal ileal Crohn's disease with perforation contained abscess. The CT scan suggests there is extensive involvement of the terminal ileum. If the patient did not have an abscess in infection, he would likely benefit from IV steroids. I'm hesitant to put them on steroids at this time until we assure that he will not need to go to surgery. The abscess may  be able to be drained percutaneously. The CT suggest extensive involvement of the small bowel. He may need more diagnostic testing such as CT enterography to look for fistulous and determine the extent of involvement. Long-term he may benefit from beginning biologic therapy.   Plan: 1. We will follow with you and at this point I would keep him NPO, and continue IV antibiotics. If he is able to take PO in the near future we could consider repeating the CT scan as a CT enterography to look for fistulous tracks or leakage into the abscess cavity. We will hold on steroids for now.   Nathan Shaw,Nathan Shaw L 12/01/2015, 1:35 PM   This note was created using voice recognition software and minor errors may Have occurred unintentionally. Pager: [phone] If no answer or after hours call [phone]

## 2015-12-01 NOTE — Progress Notes (Signed)
[facility] PROGRESS NOTE   Nathan Shaw [id number] DOB: [birth year] DOA: 11/30/2015 PCP: Gennette Pac, MD  HPI/Subjective: Feels okay, denies any fever or chills. Complaining about periumbilical and right lower quadrant abdominal pain.  Assessment/Plan: Active Problems:   Crohn's colitis ([place])   Small bowel perforation ([facility])   Small bowel perforation Likely secondary to terminal ileal Crohn's disease with contained abscess/perforation. CT suggested extensive involvement of the terminal ileum with the Crohn's disease. General surgery consulted, recommended conservative management for now. Continue Zosyn and repeat CT scan in 4-5 days or symptoms worsens.  Crohn's ileitis Patient is not on any medication at home, gastroenterology consulted. Start antibiotics.  Code Status: Full Code Family Communication: Plan discussed with the patient. Disposition Plan: Remains inpatient Diet: Diet NPO time specified Except for: Ice Chips, Sips with Meds  Consultants:  GI  Gen. surgery  Procedures:  None  Antibiotics:  Zosyn   Objective: Filed Vitals:   11/30/15 2248 12/01/15 0525  BP: 103/62 109/64  Pulse: 86 59  Temp: 98.1 F (36.7 C) 99.3 F (37.4 C)  Resp: 16 16    Intake/Output Summary (Last 24 hours) at 12/01/15 1459 Last data filed at 12/01/15 1000  Gross per 24 hour  Intake 935.42 ml  Output    750 ml  Net 185.42 ml   Filed Weights   11/30/15 2304  Weight: 83.462 kg (184 lb)    Exam: General: Alert and awake, oriented x3, not in any acute distress. HEENT: anicteric sclera, pupils reactive to light and accommodation, EOMI CVS: S1-S2 clear, no murmur rubs or gallops Chest: clear to auscultation bilaterally, no wheezing, rales or rhonchi Abdomen: soft nontender, nondistended, normal bowel sounds, no organomegaly Extremities: no cyanosis, clubbing or edema noted bilaterally Neuro: Cranial nerves II-XII intact, no focal neurological  deficits  Data Reviewed: Basic Metabolic Panel:  Recent Labs Lab 11/30/15 1739 12/01/15 0434  NA 141 142  K 3.5 3.5  CL 104 110  CO2 28 26  GLUCOSE 108* 112*  BUN 17 13  CREATININE 1.08 1.07  CALCIUM 8.6* 7.7*   Liver Function Tests:  Recent Labs Lab 12/01/15 0434  AST 25  ALT 30  ALKPHOS 58  BILITOT 1.3*  PROT 4.9*  ALBUMIN 2.3*   No results for input(s): LIPASE, AMYLASE in the last 168 hours. No results for input(s): AMMONIA in the last 168 hours. CBC:  Recent Labs Lab 11/30/15 1739 12/01/15 0434  WBC 8.5 5.5  NEUTROABS 7.0 4.4  HGB 16.3 13.8  HCT 48.9 42.3  MCV 88.4 90.2  PLT 87* 78*   Cardiac Enzymes: No results for input(s): CKTOTAL, CKMB, CKMBINDEX, TROPONINI in the last 168 hours. BNP (last 3 results) No results for input(s): BNP in the last 8760 hours.  ProBNP (last 3 results) No results for input(s): PROBNP in the last 8760 hours.  CBG:  Recent Labs Lab 11/30/15 2254 12/01/15 0522 12/01/15 1215 12/01/15 1237 12/01/15 1324  GLUCAP 87 89 69 67 111*    Micro No results found for this or any previous visit (from the past 240 hour(s)).   Studies: Ct Abdomen Pelvis W Contrast  11/30/2015  CLINICAL DATA:  Pt from PCP for possible appendicitis, co abd pain radiating to RLQ, fever x 3 days , WBC 10.3. denies nausea nor diarrhea today yet reports nausea 3 days ago. Alert and oriented x 4, no obvious distress at this time. History of Crohn's disease. EXAM: CT ABDOMEN AND PELVIS WITH CONTRAST TECHNIQUE: Multidetector CT imaging of the abdomen  and pelvis was performed using the standard protocol following bolus administration of intravenous contrast. CONTRAST:  31mL OMNIPAQUE IOHEXOL 300 MG/ML SOLN, 178mL OMNIPAQUE IOHEXOL 300 MG/ML SOLN COMPARISON:  CT 10/21/2009 FINDINGS: Lower chest: Lung bases are clear. Hepatobiliary: No focal hepatic lesion. No biliary duct dilatation. Gallbladder is normal. Common bile duct is normal. Pancreas: Pancreas is  normal. No ductal dilatation. No pancreatic inflammation. Spleen: Normal spleen Adrenals/urinary tract: Adrenal glands and kidneys are normal. The ureters and bladder normal. Stomach/Bowel: Stomach and duodenum are normal. There is poor progression of the oral contrast through the small bowel. There is a long segment of bowel wall inflammation involving the distal ileum leading up to the terminal ileum with circumferential bowel wall thickening and some mild stricturing. This is a finding of small bowel inflammation i ssimilar to CT of 2011 Adjacent to this distal small bowel inflammation, there is a contained gas and fluid collection measuring 3.5 x 3.6 x 4.2 cm (image 76, series 2). This is adjacent and along the mesenteric border of the distal ileum approximately 7 cm from the terminal ileum. There is no clear evidence of fistulous connection to additional loops of bowel or other structures. The cecum is normal. The appendix not identified. Appendix not identified on CT of 10/21/2009 either. More proximally in the small bowel regions of stricturing and small bowel dilatation as seen on image 32, series 5. Long segment of stricturing on image 40 series 5. There is pre and post stenotic dilatation associated with this mid small bowel lesion. The ascending, transverse descending, and sigmoid colon are normal. Vascular/Lymphatic: Abdominal aorta is normal caliber with atherosclerotic calcification. There is no retroperitoneal or periportal lymphadenopathy. No pelvic lymphadenopathy. Reproductive: Prostate normal Other: No free fluid. Musculoskeletal: No aggressive osseous lesion. IMPRESSION: 1. Gas and fluid collection along the mesenteric border the inflamed distal ileum consistent with small perforation with potential early abscess formation. No clear fistulous communication to other bowel structures. The findings consistent sequelae acute Crohn's inflammation. 2. Long segment of chronic distal ileal inflammation  and stricturing. 3. Multiple additional foci of stricturing throughout the small bowel with pre and posts tenotic small dilatation. No evidence of high-grade obstruction. 4. Appendix not identified on this scan or scan from 2011. 5.  Atherosclerotic calcification of the abdominal aorta. Findings conveyed Nanci Pina on 11/30/2015  at19:33. Electronically Signed   By: Suzy Bouchard M.D.   On: 11/30/2015 19:33    Scheduled Meds: . fluticasone  1 spray Each Nare Daily  . lip balm  1 application Topical BID  . piperacillin-tazobactam (ZOSYN)  IV  3.375 g Intravenous 3 times per day   Continuous Infusions: . dextrose 5 % and 0.9% NaCl 125 mL/hr at 12/01/15 1211       Time spent: 35 minutes    [facility]  [facility] Pager [phone] If 7PM-7AM, please contact night-coverage at [email or web address], password [facility] 12/01/2015, 2:59 PM  LOS: 1 day

## 2015-12-01 NOTE — Consult Note (Signed)
[facility] Surgery Consult Note  Nathan Shaw 1956-01-12  [id number].    Requesting MD: Dr. Hal Hope Chief Complaint/Reason for Consult:  Crohn's, small bowel perforation  HPI:  60 y/o white male with PMH Crohn's disease in remission last 2 years and off medications presents to [facility] because of worsening abdominal pain.  Patient's symptoms started Monday with N/V/D, fever/chills.  He thought he had the flu.  On 11/30/15 the patient started having 5/10 intermittent aching abdominal pain which was initially periumbilical but migrated to the lower quadrants.  Last BM was yesterday.  No current CP/SOB, fever/chills.  Having flatus, but admits to abdominal distension.  No radicular pain, no precipitating/alleviating factors.  CT abdomen and pelvis shows distal ileal perforation with possible abscess formation.  Sister also has Crohn's and has a permanent ileostomy.  H/o lung biopsy for pseudomonas infection.  Denies any recent use of antibiotics.  No h/o hernias or abdominal surgery.    ROS: All systems reviewed and otherwise negative except for as above  Family History  Problem Relation Age of Onset  . Crohn's disease Sister   . Hypertension Neg Hx   . Diabetes Mellitus II Neg Hx     Past Medical History  Diagnosis Date  . Crohn disease ([place]) last flare up dec 2014/ jan 2015  . Wears contact lenses     Past Surgical History  Procedure Laterality Date  . Lung biopsy  1997     was infection  . Colonoscopy with propofol N/A 02/16/2014    Procedure: COLONOSCOPY WITH PROPOFOL;  Surgeon: Garlan Fair, MD;  Location: [facility];  Service: Endoscopy;  Laterality: N/A;  . Carpal tunnel release Right 08/05/2014    Procedure: RIGHT CARPAL TUNNEL RELEASE;  Surgeon: Daryll Brod, MD;  Location: [place];  Service: Orthopedics;  Laterality: Right;    Social History:  reports that he quit smoking about 12 years ago. He has never used smokeless tobacco. He reports that he  drinks alcohol. He reports that he does not use illicit drugs.  Allergies: No Known Allergies  Medications Prior to Admission  Medication Sig Dispense Refill  . aspirin EC 81 MG tablet Take 81 mg by mouth daily.    . calcium carbonate (OS-CAL) 600 MG TABS tablet Take 600 mg by mouth daily with breakfast.    . cetirizine (ZYRTEC) 10 MG tablet Take 10 mg by mouth daily.    . cholecalciferol (VITAMIN D) 1000 UNITS tablet Take 1,000 Units by mouth daily.    . fluticasone (FLONASE) 50 MCG/ACT nasal spray Place 1 spray into both nostrils daily.    . Misc Natural Products (GLUCOS-CHONDROIT-MSM COMPLEX PO) Take 1 tablet by mouth daily.     . Multiple Vitamin (MULTIVITAMIN WITH MINERALS) TABS tablet Take 1 tablet by mouth daily.    Marland Kitchen omega-3 acid ethyl esters (LOVAZA) 1 G capsule Take 1 g by mouth daily.    Marland Kitchen omeprazole (PRILOSEC) 20 MG capsule Take 20 mg by mouth daily.    . sildenafil (REVATIO) 20 MG tablet Take 20 mg by mouth daily as needed (erectile dysfunction).    Marland Kitchen testosterone (ANDROGEL) 50 MG/5GM (1%) GEL Place 5 g onto the skin daily.    . valACYclovir (VALTREX) 500 MG tablet Take 500 mg by mouth daily.    . Cyanocobalamin (VITAMIN B-12 IJ) Inject as directed every 30 (thirty) days.    Marland Kitchen HYDROcodone-acetaminophen (NORCO) 5-325 MG per tablet Take 1 tablet by mouth every 6 (six) hours as needed  for moderate pain. (Patient not taking: Reported on 11/30/2015) 30 tablet 0    Blood pressure 109/64, pulse 59, temperature 99.3 F (37.4 C), temperature source Oral, resp. rate 16, height 5' 11" (1.803 m), weight 83.462 kg (184 lb), SpO2 98 %. Physical Exam: General: pleasant, WD/WN white male who is laying in bed in NAD HEENT: head is normocephalic, atraumatic.  Sclera are noninjected.  PERRL.  Ears and nose without any masses or lesions.  Mouth is pink and moist Heart: regular, rate, and rhythm.  No obvious murmurs, gallops, or rubs noted.  Palpable pedal pulses bilaterally Lungs: CTAB, no  wheezes, rhonchi, or rales noted.  Respiratory effort nonlabored Abd: firm, greatly distended, quite tender in RLQ, right flank and suprapubic area, +BS, no masses, hernias, or organomegaly MS: all 4 extremities are symmetrical with no cyanosis, clubbing, or edema. Skin: warm and dry with no masses, lesions, or rashes Psych: A&Ox3 with an appropriate affect.   Results for orders placed or performed during the hospital encounter of 11/30/15 (from the past 48 hour(s))  CBC with Differential/Platelet     Status: Abnormal   Collection Time: 11/30/15  5:39 PM  Result Value Ref Range   WBC 8.5 4.0 - 10.5 K/uL   RBC 5.53 4.22 - 5.81 MIL/uL   Hemoglobin 16.3 13.0 - 17.0 g/dL   HCT 48.9 39.0 - 52.0 %   MCV 88.4 78.0 - 100.0 fL   MCH 29.5 26.0 - 34.0 pg   MCHC 33.3 30.0 - 36.0 g/dL   RDW 13.3 11.5 - 15.5 %   Platelets 87 (L) 150 - 400 K/uL    Comment: SPECIMEN CHECKED FOR CLOTS REPEATED TO VERIFY PLATELET COUNT CONFIRMED BY SMEAR    Neutrophils Relative % 83 %   Lymphocytes Relative 8 %   Monocytes Relative 7 %   Eosinophils Relative 2 %   Basophils Relative 0 %   Neutro Abs 7.0 1.7 - 7.7 K/uL   Lymphs Abs 0.7 0.7 - 4.0 K/uL   Monocytes Absolute 0.6 0.1 - 1.0 K/uL   Eosinophils Absolute 0.2 0.0 - 0.7 K/uL   Basophils Absolute 0.0 0.0 - 0.1 K/uL   WBC Morphology DOHLE BODIES   Basic metabolic panel     Status: Abnormal   Collection Time: 11/30/15  5:39 PM  Result Value Ref Range   Sodium 141 135 - 145 mmol/L   Potassium 3.5 3.5 - 5.1 mmol/L   Chloride 104 101 - 111 mmol/L   CO2 28 22 - 32 mmol/L   Glucose, Bld 108 (H) 65 - 99 mg/dL   BUN 17 6 - 20 mg/dL   Creatinine, Ser 1.08 0.61 - 1.24 mg/dL   Calcium 8.6 (L) 8.9 - 10.3 mg/dL   GFR calc non Af Amer >60 >60 mL/min   GFR calc Af Amer >60 >60 mL/min    Comment: (NOTE) The eGFR has been calculated using the CKD EPI equation. This calculation has not been validated in all clinical situations. eGFR's persistently <60 mL/min  signify possible Chronic Kidney Disease.    Anion gap 9 5 - 15  Urinalysis, Routine w reflex microscopic (not at [facility])     Status: Abnormal   Collection Time: 11/30/15  7:49 PM  Result Value Ref Range   Color, Urine AMBER (A) YELLOW    Comment: BIOCHEMICALS MAY BE AFFECTED BY COLOR   APPearance CLEAR CLEAR   Specific Gravity, Urine >1.046 (H) 1.005 - 1.030   pH 6.0 5.0 - 8.0  Glucose, UA NEGATIVE NEGATIVE mg/dL   Hgb urine dipstick NEGATIVE NEGATIVE   Bilirubin Urine NEGATIVE NEGATIVE   Ketones, ur NEGATIVE NEGATIVE mg/dL   Protein, ur NEGATIVE NEGATIVE mg/dL   Nitrite NEGATIVE NEGATIVE   Leukocytes, UA NEGATIVE NEGATIVE    Comment: MICROSCOPIC NOT DONE ON URINES WITH NEGATIVE PROTEIN, BLOOD, LEUKOCYTES, NITRITE, OR GLUCOSE <1000 mg/dL.  Glucose, capillary     Status: None   Collection Time: 11/30/15 10:54 PM  Result Value Ref Range   Glucose-Capillary 87 65 - 99 mg/dL  Comprehensive metabolic panel     Status: Abnormal   Collection Time: 12/01/15  4:34 AM  Result Value Ref Range   Sodium 142 135 - 145 mmol/L   Potassium 3.5 3.5 - 5.1 mmol/L   Chloride 110 101 - 111 mmol/L   CO2 26 22 - 32 mmol/L   Glucose, Bld 112 (H) 65 - 99 mg/dL   BUN 13 6 - 20 mg/dL   Creatinine, Ser 1.07 0.61 - 1.24 mg/dL   Calcium 7.7 (L) 8.9 - 10.3 mg/dL   Total Protein 4.9 (L) 6.5 - 8.1 g/dL   Albumin 2.3 (L) 3.5 - 5.0 g/dL   AST 25 15 - 41 U/L   ALT 30 17 - 63 U/L   Alkaline Phosphatase 58 38 - 126 U/L   Total Bilirubin 1.3 (H) 0.3 - 1.2 mg/dL   GFR calc non Af Amer >60 >60 mL/min   GFR calc Af Amer >60 >60 mL/min    Comment: (NOTE) The eGFR has been calculated using the CKD EPI equation. This calculation has not been validated in all clinical situations. eGFR's persistently <60 mL/min signify possible Chronic Kidney Disease.    Anion gap 6 5 - 15  CBC WITH DIFFERENTIAL     Status: Abnormal   Collection Time: 12/01/15  4:34 AM  Result Value Ref Range   WBC 5.5 4.0 - 10.5 K/uL   RBC  4.69 4.22 - 5.81 MIL/uL   Hemoglobin 13.8 13.0 - 17.0 g/dL   HCT 42.3 39.0 - 52.0 %   MCV 90.2 78.0 - 100.0 fL   MCH 29.4 26.0 - 34.0 pg   MCHC 32.6 30.0 - 36.0 g/dL   RDW 13.6 11.5 - 15.5 %   Platelets 78 (L) 150 - 400 K/uL    Comment: CONSISTENT WITH PREVIOUS RESULT   Neutrophils Relative % 79 %   Neutro Abs 4.4 1.7 - 7.7 K/uL   Lymphocytes Relative 11 %   Lymphs Abs 0.6 (L) 0.7 - 4.0 K/uL   Monocytes Relative 8 %   Monocytes Absolute 0.5 0.1 - 1.0 K/uL   Eosinophils Relative 1 %   Eosinophils Absolute 0.1 0.0 - 0.7 K/uL   Basophils Relative 0 %   Basophils Absolute 0.0 0.0 - 0.1 K/uL  Glucose, capillary     Status: None   Collection Time: 12/01/15  5:22 AM  Result Value Ref Range   Glucose-Capillary 89 65 - 99 mg/dL   Ct Abdomen Pelvis W Contrast  11/30/2015  CLINICAL DATA:  Pt from PCP for possible appendicitis, co abd pain radiating to RLQ, fever x 3 days , WBC 10.3. denies nausea nor diarrhea today yet reports nausea 3 days ago. Alert and oriented x 4, no obvious distress at this time. History of Crohn's disease. EXAM: CT ABDOMEN AND PELVIS WITH CONTRAST TECHNIQUE: Multidetector CT imaging of the abdomen and pelvis was performed using the standard protocol following bolus administration of intravenous contrast. CONTRAST:  36m OMNIPAQUE  IOHEXOL 300 MG/ML SOLN, 163m OMNIPAQUE IOHEXOL 300 MG/ML SOLN COMPARISON:  CT 10/21/2009 FINDINGS: Lower chest: Lung bases are clear. Hepatobiliary: No focal hepatic lesion. No biliary duct dilatation. Gallbladder is normal. Common bile duct is normal. Pancreas: Pancreas is normal. No ductal dilatation. No pancreatic inflammation. Spleen: Normal spleen Adrenals/urinary tract: Adrenal glands and kidneys are normal. The ureters and bladder normal. Stomach/Bowel: Stomach and duodenum are normal. There is poor progression of the oral contrast through the small bowel. There is a long segment of bowel wall inflammation involving the distal ileum leading up to  the terminal ileum with circumferential bowel wall thickening and some mild stricturing. This is a finding of small bowel inflammation i ssimilar to CT of 2011 Adjacent to this distal small bowel inflammation, there is a contained gas and fluid collection measuring 3.5 x 3.6 x 4.2 cm (image 76, series 2). This is adjacent and along the mesenteric border of the distal ileum approximately 7 cm from the terminal ileum. There is no clear evidence of fistulous connection to additional loops of bowel or other structures. The cecum is normal. The appendix not identified. Appendix not identified on CT of 10/21/2009 either. More proximally in the small bowel regions of stricturing and small bowel dilatation as seen on image 32, series 5. Long segment of stricturing on image 40 series 5. There is pre and post stenotic dilatation associated with this mid small bowel lesion. The ascending, transverse descending, and sigmoid colon are normal. Vascular/Lymphatic: Abdominal aorta is normal caliber with atherosclerotic calcification. There is no retroperitoneal or periportal lymphadenopathy. No pelvic lymphadenopathy. Reproductive: Prostate normal Other: No free fluid. Musculoskeletal: No aggressive osseous lesion. IMPRESSION: 1. Gas and fluid collection along the mesenteric border the inflamed distal ileum consistent with small perforation with potential early abscess formation. No clear fistulous communication to other bowel structures. The findings consistent sequelae acute Crohn's inflammation. 2. Long segment of chronic distal ileal inflammation and stricturing. 3. Multiple additional foci of stricturing throughout the small bowel with pre and posts tenotic small dilatation. No evidence of high-grade obstruction. 4. Appendix not identified on this scan or scan from 2011. 5.  Atherosclerotic calcification of the abdominal aorta. Findings conveyed tNanci Pinaon 11/30/2015  at19:33. Electronically Signed   By: SSuzy Bouchard M.D.   On: 11/30/2015 19:33      Assessment/Plan Crohn's disease Distal ileal contained perforation with phlegmon -Medical management for now:  Bowel rest, IVF hydration, pain control, antiemetics -IV antibiotics - Zosyn Day #1 -GI consult for consideration of medication management of active Crohn's flair -No plans for surgical management at this time.  Will need repeat CT scan in 5-7 days to check for improvement.  If not improved may need ileocecectomy vs IR perc drainage if abscess was larger and more formed   MNat Christen [facility] 12/01/2015, 9:43 AM Pager: [phone](7am - 4:30pm M-F; 7am - 11:30am Sa/Su)

## 2015-12-01 NOTE — ED Provider Notes (Signed)
CSN: [id number]     Arrival date & time 11/30/15  1645 History   First MD Initiated Contact with Patient 11/30/15 1659     Chief Complaint  Patient presents with  . Abdominal Pain     HPI  Patient presents for evaluation of abdominal pain. History of Crohn's disease. Has been under good control, and untreated for the last 2 years. Follows with Dr. Wynetta Emery at [place].  Patient states that 2 days ago he had a temperature up to 100.2 at home. Some cramping nonlocalized abdominal pain and multiple episodes of emesis. This was on Monday. Yesterday, Tuesday he had less vomiting but his pain did persist a little less frequent. Today his pain is localized to his right lower quadrant. Seen by his primary care physician. Referred here for possible appendicitis.  With his Crohn's exacerbations in the past he has never had bleeding. Had previously been on immunosuppressants, and prednisone. None again for the last 2 years. He is not immune modulated now  Past Medical History  Diagnosis Date  . Crohn disease ([place]) last flare up dec 2014/ jan 2015  . Wears contact lenses    Past Surgical History  Procedure Laterality Date  . Lung biopsy  1997     was infection  . Colonoscopy with propofol N/A 02/16/2014    Procedure: COLONOSCOPY WITH PROPOFOL;  Surgeon: Garlan Fair, MD;  Location: [facility];  Service: Endoscopy;  Laterality: N/A;  . Carpal tunnel release Right 08/05/2014    Procedure: RIGHT CARPAL TUNNEL RELEASE;  Surgeon: Daryll Brod, MD;  Location: [place];  Service: Orthopedics;  Laterality: Right;   Family History  Problem Relation Age of Onset  . Crohn's disease Sister   . Hypertension Neg Hx   . Diabetes Mellitus II Neg Hx    Social History  Substance Use Topics  . Smoking status: Former Smoker -- 1.50 packs/day for 18 years    Quit date: 10/02/2003  . Smokeless tobacco: Never Used  . Alcohol Use: Yes     Comment: daily 2-3 ounce per day    Review of  Systems  Constitutional: Positive for fever. Negative for chills, diaphoresis, appetite change and fatigue.  HENT: Negative for mouth sores, sore throat and trouble swallowing.   Eyes: Negative for visual disturbance.  Respiratory: Negative for cough, chest tightness, shortness of breath and wheezing.   Cardiovascular: Negative for chest pain.  Gastrointestinal: Positive for nausea, vomiting and abdominal pain. Negative for diarrhea and abdominal distention.  Endocrine: Negative for polydipsia, polyphagia and polyuria.  Genitourinary: Negative for dysuria, frequency and hematuria.  Musculoskeletal: Negative for gait problem.  Skin: Negative for color change, pallor and rash.  Neurological: Negative for dizziness, syncope, light-headedness and headaches.  Hematological: Does not bruise/bleed easily.  Psychiatric/Behavioral: Negative for behavioral problems and confusion.      Allergies  Review of patient's allergies indicates no known allergies.  Home Medications   Prior to Admission medications   Medication Sig Start Date End Date Taking? Authorizing Provider  aspirin EC 81 MG tablet Take 81 mg by mouth daily.   Yes Historical Provider, MD  calcium carbonate (OS-CAL) 600 MG TABS tablet Take 600 mg by mouth daily with breakfast.   Yes Historical Provider, MD  cetirizine (ZYRTEC) 10 MG tablet Take 10 mg by mouth daily.   Yes Historical Provider, MD  cholecalciferol (VITAMIN D) 1000 UNITS tablet Take 1,000 Units by mouth daily.   Yes Historical Provider, MD  fluticasone (FLONASE) 50  MCG/ACT nasal spray Place 1 spray into both nostrils daily.   Yes Historical Provider, MD  Misc Natural Products (GLUCOS-CHONDROIT-MSM COMPLEX PO) Take 1 tablet by mouth daily.    Yes Historical Provider, MD  Multiple Vitamin (MULTIVITAMIN WITH MINERALS) TABS tablet Take 1 tablet by mouth daily.   Yes Historical Provider, MD  omega-3 acid ethyl esters (LOVAZA) 1 G capsule Take 1 g by mouth daily.   Yes  Historical Provider, MD  omeprazole (PRILOSEC) 20 MG capsule Take 20 mg by mouth daily.   Yes Historical Provider, MD  sildenafil (REVATIO) 20 MG tablet Take 20 mg by mouth daily as needed (erectile dysfunction).   Yes Historical Provider, MD  testosterone (ANDROGEL) 50 MG/5GM (1%) GEL Place 5 g onto the skin daily.   Yes Historical Provider, MD  valACYclovir (VALTREX) 500 MG tablet Take 500 mg by mouth daily.   Yes Historical Provider, MD  Cyanocobalamin (VITAMIN B-12 IJ) Inject as directed every 30 (thirty) days.    Historical Provider, MD  HYDROcodone-acetaminophen (NORCO) 5-325 MG per tablet Take 1 tablet by mouth every 6 (six) hours as needed for moderate pain. Patient not taking: Reported on 11/30/2015 08/05/14   Daryll Brod, MD   BP 103/62 mmHg  Pulse 86  Temp(Src) 98.1 F (36.7 C) (Oral)  Resp 16  Ht 5\' 11"  (1.803 m)  Wt 184 lb (83.462 kg)  BMI 25.67 kg/m2  SpO2 99% Physical Exam  Constitutional: He is oriented to person, place, and time. He appears well-developed and well-nourished. No distress.  HENT:  Head: Normocephalic.  Eyes: Conjunctivae are normal. Pupils are equal, round, and reactive to light. No scleral icterus.  Neck: Normal range of motion. Neck supple. No thyromegaly present.  Cardiovascular: Normal rate and regular rhythm.  Exam reveals no gallop and no friction rub.   No murmur heard. Pulmonary/Chest: Effort normal and breath sounds normal. No respiratory distress. He has no wheezes. He has no rales.  Abdominal: Soft. Bowel sounds are normal. He exhibits no distension. There is no tenderness. There is no rebound.    Localized tenderness of the right lower quadrant. No frank peritoneal irritation. Soft abdomen. No rigidity.  Musculoskeletal: Normal range of motion.  Neurological: He is alert and oriented to person, place, and time.  Skin: Skin is warm and dry. No rash noted.  Psychiatric: He has a normal mood and affect. His behavior is normal.    ED Course    Procedures (including critical care time) Labs Review Labs Reviewed  CBC WITH DIFFERENTIAL/PLATELET - Abnormal; Notable for the following:    Platelets 87 (*)    All other components within normal limits  BASIC METABOLIC PANEL - Abnormal; Notable for the following:    Glucose, Bld 108 (*)    Calcium 8.6 (*)    All other components within normal limits  URINALYSIS, ROUTINE W REFLEX MICROSCOPIC (NOT AT [facility]) - Abnormal; Notable for the following:    Color, Urine AMBER (*)    Specific Gravity, Urine >1.046 (*)    All other components within normal limits  GLUCOSE, CAPILLARY  COMPREHENSIVE METABOLIC PANEL  CBC WITH DIFFERENTIAL/PLATELET    Imaging Review Ct Abdomen Pelvis W Contrast  11/30/2015  CLINICAL DATA:  Pt from PCP for possible appendicitis, co abd pain radiating to RLQ, fever x 3 days , WBC 10.3. denies nausea nor diarrhea today yet reports nausea 3 days ago. Alert and oriented x 4, no obvious distress at this time. History of Crohn's disease. EXAM: CT ABDOMEN  AND PELVIS WITH CONTRAST TECHNIQUE: Multidetector CT imaging of the abdomen and pelvis was performed using the standard protocol following bolus administration of intravenous contrast. CONTRAST:  4mL OMNIPAQUE IOHEXOL 300 MG/ML SOLN, 154mL OMNIPAQUE IOHEXOL 300 MG/ML SOLN COMPARISON:  CT 10/21/2009 FINDINGS: Lower chest: Lung bases are clear. Hepatobiliary: No focal hepatic lesion. No biliary duct dilatation. Gallbladder is normal. Common bile duct is normal. Pancreas: Pancreas is normal. No ductal dilatation. No pancreatic inflammation. Spleen: Normal spleen Adrenals/urinary tract: Adrenal glands and kidneys are normal. The ureters and bladder normal. Stomach/Bowel: Stomach and duodenum are normal. There is poor progression of the oral contrast through the small bowel. There is a long segment of bowel wall inflammation involving the distal ileum leading up to the terminal ileum with circumferential bowel wall thickening and some  mild stricturing. This is a finding of small bowel inflammation i ssimilar to CT of 2011 Adjacent to this distal small bowel inflammation, there is a contained gas and fluid collection measuring 3.5 x 3.6 x 4.2 cm (image 76, series 2). This is adjacent and along the mesenteric border of the distal ileum approximately 7 cm from the terminal ileum. There is no clear evidence of fistulous connection to additional loops of bowel or other structures. The cecum is normal. The appendix not identified. Appendix not identified on CT of 10/21/2009 either. More proximally in the small bowel regions of stricturing and small bowel dilatation as seen on image 32, series 5. Long segment of stricturing on image 40 series 5. There is pre and post stenotic dilatation associated with this mid small bowel lesion. The ascending, transverse descending, and sigmoid colon are normal. Vascular/Lymphatic: Abdominal aorta is normal caliber with atherosclerotic calcification. There is no retroperitoneal or periportal lymphadenopathy. No pelvic lymphadenopathy. Reproductive: Prostate normal Other: No free fluid. Musculoskeletal: No aggressive osseous lesion. IMPRESSION: 1. Gas and fluid collection along the mesenteric border the inflamed distal ileum consistent with small perforation with potential early abscess formation. No clear fistulous communication to other bowel structures. The findings consistent sequelae acute Crohn's inflammation. 2. Long segment of chronic distal ileal inflammation and stricturing. 3. Multiple additional foci of stricturing throughout the small bowel with pre and posts tenotic small dilatation. No evidence of high-grade obstruction. 4. Appendix not identified on this scan or scan from 2011. 5.  Atherosclerotic calcification of the abdominal aorta. Findings conveyed Nanci Pina on 11/30/2015  at19:33. Electronically Signed   By: Suzy Bouchard M.D.   On: 11/30/2015 19:33   I have personally reviewed and evaluated  these images and lab results as part of my medical decision-making.   EKG Interpretation None      MDM   Final diagnoses:  Crohn's disease of large intestine with abscess ([place])    CT scan shows terminal ileitis with adjacent fluid and gas collection consistent with probable contain abscess. Appendix not visualized. However not obviously distended or involved. I discussed the case with general surgery. Dr. Marcello Moores felt that they could be consult and if needed if not improving or complicated, however recommended admission to medicine with GI consultation and percutaneous drainage. I discussed the case with triad hospitalist. Patient will be admitted. Given IV Zosyn here.    Tanna Furry, MD 12/01/15 [phone]

## 2015-12-01 NOTE — Progress Notes (Signed)
Pharmacy Antibiotic Note  Nathan Shaw is a 60 y.o. male admitted on 11/30/2015 with Intra-abdominal infection.  Pharmacy has been consulted for zosyn dosing.  Plan: Zosyn 3.375g IV q8h (4 hour infusion).  Height: 5\' 11"  (180.3 cm) Weight: 184 lb (83.462 kg) IBW/kg (Calculated) : 75.3  Temp (24hrs), Avg:98.2 F (36.8 C), Min:98.1 F (36.7 C), Max:98.3 F (36.8 C)   Recent Labs Lab 11/30/15 1739 12/01/15 0434  WBC 8.5 5.5  CREATININE 1.08  --     Estimated Creatinine Clearance: 80.4 mL/min (by C-G formula based on Cr of 1.08).    No Known Allergies  Antimicrobials this admission: Zosyn 3/1 >>   Thank you for allowing pharmacy to be a part of this patient's care.  Nathan Shaw 12/01/2015 5:02 AM

## 2015-12-02 LAB — GLUCOSE, CAPILLARY
Glucose-Capillary: 79 mg/dL (ref 65–99)
Glucose-Capillary: 85 mg/dL (ref 65–99)
Glucose-Capillary: 85 mg/dL (ref 65–99)
Glucose-Capillary: 90 mg/dL (ref 65–99)

## 2015-12-02 NOTE — Progress Notes (Signed)
[facility] PROGRESS NOTE   Nathan Shaw [id number] DOB: Jan 27, 1956 DOA: 11/30/2015 PCP: Gennette Pac, MD  HPI/Subjective: Low-grade temp but not feeling worse.   Assessment/Plan: Principal Problem:   Ileal perforation from Crohn's ileitis Active Problems:   Crohn's ileitis ([place])   Small bowel perforation with contained phlegmon/abscess Likely secondary to terminal ileal Crohn's disease with contained abscess/perforation. CT suggested extensive involvement of the terminal ileum with the Crohn's disease. General surgery consulted, recommended conservative management for now. Wasn't Zosyn, switched to Cipro/Flagyl by general surgery, continue antibiotics. Repeat CT scan in a few days or if the symptoms worsen.  Crohn's ileitis Patient is not on any medication at home, gastroenterology consulted. On antibiotics, hold steroids.  Code Status: Full Code Family Communication: Plan discussed with the patient. Disposition Plan: Remains inpatient Diet: Diet NPO time specified Except for: Ice Chips, Sips with Meds  Consultants:  GI  Gen. surgery  Procedures:  None  Antibiotics:  Zosyn---->3/1--->3/2  Cipro/[place]-----3/2   Objective: Filed Vitals:   12/02/15 0513 12/02/15 1400  BP: 137/61 115/63  Pulse: 96 93  Temp: 100 F (37.8 C) 99.9 F (37.7 C)  Resp: 18 18    Intake/Output Summary (Last 24 hours) at 12/02/15 1643 Last data filed at 12/02/15 1400  Gross per 24 hour  Intake   2700 ml  Output   3400 ml  Net   -700 ml   Filed Weights   11/30/15 2304  Weight: 83.462 kg (184 lb)    Exam: General: Alert and awake, oriented x3, not in any acute distress. HEENT: anicteric sclera, pupils reactive to light and accommodation, EOMI CVS: S1-S2 clear, no murmur rubs or gallops Chest: clear to auscultation bilaterally, no wheezing, rales or rhonchi Abdomen: soft nontender, nondistended, normal bowel sounds, no organomegaly Extremities: no  cyanosis, clubbing or edema noted bilaterally Neuro: Cranial nerves II-XII intact, no focal neurological deficits  Data Reviewed: Basic Metabolic Panel:  Recent Labs Lab 11/30/15 1739 12/01/15 0434  NA 141 142  K 3.5 3.5  CL 104 110  CO2 28 26  GLUCOSE 108* 112*  BUN 17 13  CREATININE 1.08 1.07  CALCIUM 8.6* 7.7*   Liver Function Tests:  Recent Labs Lab 12/01/15 0434  AST 25  ALT 30  ALKPHOS 58  BILITOT 1.3*  PROT 4.9*  ALBUMIN 2.3*   No results for input(s): LIPASE, AMYLASE in the last 168 hours. No results for input(s): AMMONIA in the last 168 hours. CBC:  Recent Labs Lab 11/30/15 1739 12/01/15 0434  WBC 8.5 5.5  NEUTROABS 7.0 4.4  HGB 16.3 13.8  HCT 48.9 42.3  MCV 88.4 90.2  PLT 87* 78*   Cardiac Enzymes: No results for input(s): CKTOTAL, CKMB, CKMBINDEX, TROPONINI in the last 168 hours. BNP (last 3 results) No results for input(s): BNP in the last 8760 hours.  ProBNP (last 3 results) No results for input(s): PROBNP in the last 8760 hours.  CBG:  Recent Labs Lab 12/01/15 1324 12/01/15 1800 12/01/15 2343 12/02/15 0507 12/02/15 1206  GLUCAP 111* 89 87 85 79    Micro No results found for this or any previous visit (from the past 240 hour(s)).   Studies: Ct Abdomen Pelvis W Contrast  11/30/2015  CLINICAL DATA:  Pt from PCP for possible appendicitis, co abd pain radiating to RLQ, fever x 3 days , WBC 10.3. denies nausea nor diarrhea today yet reports nausea 3 days ago. Alert and oriented x 4, no obvious distress at this time. History of Crohn's disease.  EXAM: CT ABDOMEN AND PELVIS WITH CONTRAST TECHNIQUE: Multidetector CT imaging of the abdomen and pelvis was performed using the standard protocol following bolus administration of intravenous contrast. CONTRAST:  63mL OMNIPAQUE IOHEXOL 300 MG/ML SOLN, 157mL OMNIPAQUE IOHEXOL 300 MG/ML SOLN COMPARISON:  CT 10/21/2009 FINDINGS: Lower chest: Lung bases are clear. Hepatobiliary: No focal hepatic lesion.  No biliary duct dilatation. Gallbladder is normal. Common bile duct is normal. Pancreas: Pancreas is normal. No ductal dilatation. No pancreatic inflammation. Spleen: Normal spleen Adrenals/urinary tract: Adrenal glands and kidneys are normal. The ureters and bladder normal. Stomach/Bowel: Stomach and duodenum are normal. There is poor progression of the oral contrast through the small bowel. There is a long segment of bowel wall inflammation involving the distal ileum leading up to the terminal ileum with circumferential bowel wall thickening and some mild stricturing. This is a finding of small bowel inflammation i ssimilar to CT of 2011 Adjacent to this distal small bowel inflammation, there is a contained gas and fluid collection measuring 3.5 x 3.6 x 4.2 cm (image 76, series 2). This is adjacent and along the mesenteric border of the distal ileum approximately 7 cm from the terminal ileum. There is no clear evidence of fistulous connection to additional loops of bowel or other structures. The cecum is normal. The appendix not identified. Appendix not identified on CT of 10/21/2009 either. More proximally in the small bowel regions of stricturing and small bowel dilatation as seen on image 32, series 5. Long segment of stricturing on image 40 series 5. There is pre and post stenotic dilatation associated with this mid small bowel lesion. The ascending, transverse descending, and sigmoid colon are normal. Vascular/Lymphatic: Abdominal aorta is normal caliber with atherosclerotic calcification. There is no retroperitoneal or periportal lymphadenopathy. No pelvic lymphadenopathy. Reproductive: Prostate normal Other: No free fluid. Musculoskeletal: No aggressive osseous lesion. IMPRESSION: 1. Gas and fluid collection along the mesenteric border the inflamed distal ileum consistent with small perforation with potential early abscess formation. No clear fistulous communication to other bowel structures. The findings  consistent sequelae acute Crohn's inflammation. 2. Long segment of chronic distal ileal inflammation and stricturing. 3. Multiple additional foci of stricturing throughout the small bowel with pre and posts tenotic small dilatation. No evidence of high-grade obstruction. 4. Appendix not identified on this scan or scan from 2011. 5.  Atherosclerotic calcification of the abdominal aorta. Findings conveyed Nanci Pina on 11/30/2015  at19:33. Electronically Signed   By: Suzy Bouchard M.D.   On: 11/30/2015 19:33    Scheduled Meds: . ciprofloxacin  400 mg Intravenous Q12H  . fluticasone  1 spray Each Nare Daily  . lip balm  1 application Topical BID  . metronidazole  500 mg Intravenous Q6H   Continuous Infusions:       Time spent: 35 minutes    [facility] A  [facility] Pager [phone] If 7PM-7AM, please contact night-coverage at [email or web address], password [facility] 12/02/2015, 4:43 PM  LOS: 2 days

## 2015-12-02 NOTE — Progress Notes (Signed)
[facility]    Progress Note  Subjective: States he feels "about the same." +Flatus but no BMs. Passing urine without issue. NPO. Feels bloated. Still with moderate RLQ abdominal pain. Febrile yesterday to 102.8.   Objective: Vital signs in last 24 hours: Temp:  [99.3 F (37.4 C)-102.8 F (39.3 C)] 100 F (37.8 C) (03/03 0513) Pulse Rate:  [64-96] 96 (03/03 0513) Resp:  [16-18] 18 (03/03 0513) BP: (102-137)/(52-66) 137/61 mmHg (03/03 0513) SpO2:  [96 %-100 %] 100 % (03/03 0513) Last BM Date: 11/30/15  Intake/Output from previous day: 03/02 0701 - 03/03 0700 In: 3100 [I.V.:3000; IV Piggyback:100] Out: 2050 [Urine:2050] Intake/Output this shift: Total I/O In: -  Out: 800 [Urine:800]  PE: GEN: ambulating in room. NAD CV: RRR PULM: CTA B/L Abd: soft, quiet with moderate distension and focal tenderness (moderate) at RLQ.   Lab Results:   Recent Labs  11/30/15 1739 12/01/15 0434  WBC 8.5 5.5  HGB 16.3 13.8  HCT 48.9 42.3  PLT 87* 78*   BMET  Recent Labs  11/30/15 1739 12/01/15 0434  NA 141 142  K 3.5 3.5  CL 104 110  CO2 28 26  GLUCOSE 108* 112*  BUN 17 13  CREATININE 1.08 1.07  CALCIUM 8.6* 7.7*   PT/INR No results for input(s): LABPROT, INR in the last 72 hours. CMP     Component Value Date/Time   NA 142 12/01/2015 0434   K 3.5 12/01/2015 0434   CL 110 12/01/2015 0434   CO2 26 12/01/2015 0434   GLUCOSE 112* 12/01/2015 0434   BUN 13 12/01/2015 0434   CREATININE 1.07 12/01/2015 0434   CALCIUM 7.7* 12/01/2015 0434   PROT 4.9* 12/01/2015 0434   ALBUMIN 2.3* 12/01/2015 0434   AST 25 12/01/2015 0434   ALT 30 12/01/2015 0434   ALKPHOS 58 12/01/2015 0434   BILITOT 1.3* 12/01/2015 0434   GFRNONAA >60 12/01/2015 0434   GFRAA >60 12/01/2015 0434   Lipase  No results found for: LIPASE     Studies/Results: Ct Abdomen Pelvis W Contrast  11/30/2015  CLINICAL DATA:  Pt from PCP for possible appendicitis, co abd pain radiating to RLQ,  fever x 3 days , WBC 10.3. denies nausea nor diarrhea today yet reports nausea 3 days ago. Alert and oriented x 4, no obvious distress at this time. History of Crohn's disease. EXAM: CT ABDOMEN AND PELVIS WITH CONTRAST TECHNIQUE: Multidetector CT imaging of the abdomen and pelvis was performed using the standard protocol following bolus administration of intravenous contrast. CONTRAST:  85mL OMNIPAQUE IOHEXOL 300 MG/ML SOLN, 162mL OMNIPAQUE IOHEXOL 300 MG/ML SOLN COMPARISON:  CT 10/21/2009 FINDINGS: Lower chest: Lung bases are clear. Hepatobiliary: No focal hepatic lesion. No biliary duct dilatation. Gallbladder is normal. Common bile duct is normal. Pancreas: Pancreas is normal. No ductal dilatation. No pancreatic inflammation. Spleen: Normal spleen Adrenals/urinary tract: Adrenal glands and kidneys are normal. The ureters and bladder normal. Stomach/Bowel: Stomach and duodenum are normal. There is poor progression of the oral contrast through the small bowel. There is a long segment of bowel wall inflammation involving the distal ileum leading up to the terminal ileum with circumferential bowel wall thickening and some mild stricturing. This is a finding of small bowel inflammation i ssimilar to CT of 2011 Adjacent to this distal small bowel inflammation, there is a contained gas and fluid collection measuring 3.5 x 3.6 x 4.2 cm (image 76, series 2). This is adjacent and along the mesenteric border of the distal  ileum approximately 7 cm from the terminal ileum. There is no clear evidence of fistulous connection to additional loops of bowel or other structures. The cecum is normal. The appendix not identified. Appendix not identified on CT of 10/21/2009 either. More proximally in the small bowel regions of stricturing and small bowel dilatation as seen on image 32, series 5. Long segment of stricturing on image 40 series 5. There is pre and post stenotic dilatation associated with this mid small bowel lesion. The  ascending, transverse descending, and sigmoid colon are normal. Vascular/Lymphatic: Abdominal aorta is normal caliber with atherosclerotic calcification. There is no retroperitoneal or periportal lymphadenopathy. No pelvic lymphadenopathy. Reproductive: Prostate normal Other: No free fluid. Musculoskeletal: No aggressive osseous lesion. IMPRESSION: 1. Gas and fluid collection along the mesenteric border the inflamed distal ileum consistent with small perforation with potential early abscess formation. No clear fistulous communication to other bowel structures. The findings consistent sequelae acute Crohn's inflammation. 2. Long segment of chronic distal ileal inflammation and stricturing. 3. Multiple additional foci of stricturing throughout the small bowel with pre and posts tenotic small dilatation. No evidence of high-grade obstruction. 4. Appendix not identified on this scan or scan from 2011. 5.  Atherosclerotic calcification of the abdominal aorta. Findings conveyed Nanci Pina on 11/30/2015  at19:33. Electronically Signed   By: Suzy Bouchard M.D.   On: 11/30/2015 19:33    Anti-infectives: Anti-infectives    Start     Dose/Rate Route Frequency Ordered Stop   12/01/15 1800  ciprofloxacin (CIPRO) IVPB 400 mg     400 mg 200 mL/hr over 60 Minutes Intravenous Every 12 hours 12/01/15 1657     12/01/15 1800  metroNIDAZOLE (FLAGYL) IVPB 500 mg     500 mg 100 mL/hr over 60 Minutes Intravenous Every 6 hours 12/01/15 1657     12/01/15 0600  piperacillin-tazobactam (ZOSYN) IVPB 3.375 g  Status:  Discontinued     3.375 g 12.5 mL/hr over 240 Minutes Intravenous 3 times per day 12/01/15 0459 12/01/15 1701   11/30/15 2030  piperacillin-tazobactam (ZOSYN) IVPB 3.375 g     3.375 g 100 mL/hr over 30 Minutes Intravenous  Once 11/30/15 2019 11/30/15 2113   11/30/15 2015  piperacillin-tazobactam (ZOSYN) IVPB 3.375 g  Status:  Discontinued     3.375 g 12.5 mL/hr over 240 Minutes Intravenous  Once 11/30/15 2003  11/30/15 2019       Assessment/Plan Crohn's disease Distal ileal contained perforation with phlegmon -Medical management for now: Bowel rest, IVF hydration, pain control, antiemetics. Currently with mild ileus. -IV antibiotics - now on Cipro/Flagyl -GI consult: being followed by Dr. Laurence Spates. Advises holding off on treatment with steroids until CT repeated in 3-4 days. Will be followed by Dr. Michail Sermon over weekend. -No plans for surgical management/intervention at this time. Will need repeat CT scan in 3-4 days to check for improvement. If not improved may need ileocecectomy vs IR perc drainage if abscess increases in size or becomes more organized or if patient develops indications of intraabdominal sepsis.  . LOS: 2 days    LEE Adem Costlow, [facility] 12/02/2015, 11:24 AM

## 2015-12-02 NOTE — Progress Notes (Signed)
[facility] PROGRESS NOTE Subjective Pt not better but not worse some flatus still with lower pain  Objective: Vital signs in last 24 hours: Temp:  [99.3 F (37.4 C)-102.8 F (39.3 C)] 100 F (37.8 C) (03/03 0513) Pulse Rate:  [64-96] 96 (03/03 0513) Resp:  [16-18] 18 (03/03 0513) BP: (102-137)/(52-66) 137/61 mmHg (03/03 0513) SpO2:  [96 %-100 %] 100 % (03/03 0513) Last BM Date: 11/30/15  Intake/Output from previous day: 03/02 0701 - 03/03 0700 In: 3100 [I.V.:3000; IV Piggyback:100] Out: 2050 [Urine:2050] Intake/Output this shift:    PE: Abdomen--quiet, slight distension marked RLQ tenderness  Lab Results:  Recent Labs  11/30/15 1739 12/01/15 0434  WBC 8.5 5.5  HGB 16.3 13.8  HCT 48.9 42.3  PLT 87* 78*   BMET  Recent Labs  11/30/15 1739 12/01/15 0434  NA 141 142  K 3.5 3.5  CL 104 110  CO2 28 26  CREATININE 1.08 1.07   LFT  Recent Labs  12/01/15 0434  PROT 4.9*  AST 25  ALT 30  ALKPHOS 58  BILITOT 1.3*   PT/INR No results for input(s): LABPROT, INR in the last 72 hours. PANCREAS No results for input(s): LIPASE in the last 72 hours.       Studies/Results: Ct Abdomen Pelvis W Contrast  11/30/2015  CLINICAL DATA:  Pt from PCP for possible appendicitis, co abd pain radiating to RLQ, fever x 3 days , WBC 10.3. denies nausea nor diarrhea today yet reports nausea 3 days ago. Alert and oriented x 4, no obvious distress at this time. History of Crohn's disease. EXAM: CT ABDOMEN AND PELVIS WITH CONTRAST TECHNIQUE: Multidetector CT imaging of the abdomen and pelvis was performed using the standard protocol following bolus administration of intravenous contrast. CONTRAST:  64mL OMNIPAQUE IOHEXOL 300 MG/ML SOLN, 184mL OMNIPAQUE IOHEXOL 300 MG/ML SOLN COMPARISON:  CT 10/21/2009 FINDINGS: Lower chest: Lung bases are clear. Hepatobiliary: No focal hepatic lesion. No biliary duct dilatation. Gallbladder is normal. Common bile duct is normal. Pancreas:  Pancreas is normal. No ductal dilatation. No pancreatic inflammation. Spleen: Normal spleen Adrenals/urinary tract: Adrenal glands and kidneys are normal. The ureters and bladder normal. Stomach/Bowel: Stomach and duodenum are normal. There is poor progression of the oral contrast through the small bowel. There is a long segment of bowel wall inflammation involving the distal ileum leading up to the terminal ileum with circumferential bowel wall thickening and some mild stricturing. This is a finding of small bowel inflammation i ssimilar to CT of 2011 Adjacent to this distal small bowel inflammation, there is a contained gas and fluid collection measuring 3.5 x 3.6 x 4.2 cm (image 76, series 2). This is adjacent and along the mesenteric border of the distal ileum approximately 7 cm from the terminal ileum. There is no clear evidence of fistulous connection to additional loops of bowel or other structures. The cecum is normal. The appendix not identified. Appendix not identified on CT of 10/21/2009 either. More proximally in the small bowel regions of stricturing and small bowel dilatation as seen on image 32, series 5. Long segment of stricturing on image 40 series 5. There is pre and post stenotic dilatation associated with this mid small bowel lesion. The ascending, transverse descending, and sigmoid colon are normal. Vascular/Lymphatic: Abdominal aorta is normal caliber with atherosclerotic calcification. There is no retroperitoneal or periportal lymphadenopathy. No pelvic lymphadenopathy. Reproductive: Prostate normal Other: No free fluid. Musculoskeletal: No aggressive osseous lesion. IMPRESSION: 1. Gas and fluid collection along the mesenteric border  the inflamed distal ileum consistent with small perforation with potential early abscess formation. No clear fistulous communication to other bowel structures. The findings consistent sequelae acute Crohn's inflammation. 2. Long segment of chronic distal ileal  inflammation and stricturing. 3. Multiple additional foci of stricturing throughout the small bowel with pre and posts tenotic small dilatation. No evidence of high-grade obstruction. 4. Appendix not identified on this scan or scan from 2011. 5.  Atherosclerotic calcification of the abdominal aorta. Findings conveyed Nanci Pina on 11/30/2015  at19:33. Electronically Signed   By: Suzy Bouchard M.D.   On: 11/30/2015 19:33    Medications: I have reviewed the patient's current medications.  Assessment/Plan: 1. Ileal Crohns with contained perferation and abcess. Continues on ABs Would hold any consideration of steroids for now until repeat CT in a few days. ? Percutaneous drain ? Surgery depending on results. Dr Michail Sermon will check this weekend.   Timarion Agcaoili JR,Ann Groeneveld L 12/02/2015, 10:12 AM  This note was created using voice recognition software. Minor errors may Have occurred unintentionally.  Pager: [phone] If no answer or after hours call [phone]

## 2015-12-03 DIAGNOSIS — K210 Gastro-esophageal reflux disease with esophagitis: Secondary | ICD-10-CM

## 2015-12-03 DIAGNOSIS — K219 Gastro-esophageal reflux disease without esophagitis: Secondary | ICD-10-CM | POA: Diagnosis present

## 2015-12-03 DIAGNOSIS — D696 Thrombocytopenia, unspecified: Secondary | ICD-10-CM | POA: Diagnosis present

## 2015-12-03 LAB — GLUCOSE, CAPILLARY: Glucose-Capillary: 90 mg/dL (ref 65–99)

## 2015-12-03 MED ORDER — POTASSIUM CHLORIDE 2 MEQ/ML IV SOLN
INTRAVENOUS | Status: AC
  Administered 2015-12-03 – 2015-12-04 (×3): via INTRAVENOUS
  Filled 2015-12-03 (×7): qty 1000

## 2015-12-03 MED ORDER — PANTOPRAZOLE SODIUM 40 MG IV SOLR
40.0000 mg | Freq: Every day | INTRAVENOUS | Status: DC
  Administered 2015-12-03 – 2015-12-08 (×6): 40 mg via INTRAVENOUS
  Filled 2015-12-03 (×7): qty 40

## 2015-12-03 NOTE — Progress Notes (Signed)
Patient ID: Nathan Shaw, male   DOB: [birth year], 58 y.o.   MRN: [id number] [facility] Progress Note  Nathan Shaw 60 y.o. [birth year]   Subjective: Feels less abdominal pain today. No BMs. Girlfriend in room.  Objective: Vital signs in last 24 hours: Filed Vitals:   12/03/15 0549 12/03/15 1710  BP: 124/65 122/76  Pulse: 88 63  Temp: 98.2 F (36.8 C) 98.4 F (36.9 C)  Resp: 16 16    Physical Exam: Gen: lethargic, no acute distress CV: RRR Chest: CTA B Abd: diffuse tenderness (greatest in RLQ) with guarding, +distention, +BS Ext: no edema  Lab Results:  Recent Labs  12/01/15 0434  NA 142  K 3.5  CL 110  CO2 26  GLUCOSE 112*  BUN 13  CREATININE 1.07  CALCIUM 7.7*    Recent Labs  12/01/15 0434  AST 25  ALT 30  ALKPHOS 58  BILITOT 1.3*  PROT 4.9*  ALBUMIN 2.3*    Recent Labs  12/01/15 0434  WBC 5.5  NEUTROABS 4.4  HGB 13.8  HCT 42.3  MCV 90.2  PLT 78*   No results for input(s): LABPROT, INR in the last 72 hours.    Assessment/Plan: Crohn's disease with distal ileal perforation and abscess - continue IV Abx. Due to abscess would hold off on steroids since clinically he seems to be improving. Platelets low could be from IV Zosyn and was changed to Cipro/Flagyl 12/01/14. Agree with repeat CT scan early next week. Keep NPO. Will follow.   [place] C. 12/03/2015, 6:03 PM  Pager [phone]  If no answer or after 5 PM call [phone]

## 2015-12-03 NOTE — Progress Notes (Signed)
Patient ID: Nathan Shaw, male   DOB: 09/17/1956, 60 y.o.   MRN: [id number]  Patient taking shower when I came to see today. Girlfriend at bedside and reports that he feels better with less abdominal pain.   Continue IV Abx and due to abscess would hold off on steroids since clinically he seems to be improving. Platelets low could be from IV Zosyn and was changed to Cipro/Flagyl 12/01/14. Agree with repeat CT scan early next week. Keep NPO. Will follow.  D/W Dr. Lucia Gaskins.

## 2015-12-03 NOTE — Progress Notes (Signed)
[facility] PROGRESS NOTE   Nathan Shaw [id number] DOB: 09-May-1956 DOA: 11/30/2015 PCP: Gennette Pac, MD  HPI/Subjective: Feels okay, denies any new complaints. Still has some tenderness   Assessment/Plan: Principal Problem:   Ileal perforation from Crohn's ileitis Active Problems:   Crohn's ileitis ([facility])   GERD (gastroesophageal reflux disease)   Thrombocytopenia ([facility])   Small bowel perforation with contained phlegmon/abscess Likely secondary to terminal ileal Crohn's disease with contained abscess/perforation. CT suggested extensive involvement of the terminal ileum with the Crohn's disease. General surgery consulted, recommended conservative management for now. Wasn't Zosyn, switched to Cipro/Flagyl by general surgery, continue continue current regimen Repeat CT scan in a few days or if the symptoms worsen.   Crohn's ileitis Patient is not on any medication at home, gastroenterology consulted. On antibiotics, hold steroids.  GERD Patient asking for Prilosec placed on Protonix.  Thrombocytopenia Unclear etiology, cannot rule out infection as cause. Check CBC in a.m.  Code Status: Full Code Family Communication: Plan discussed with the patient. Disposition Plan: Remains inpatient Diet: Diet NPO time specified Except for: Ice Chips, Sips with Meds  Consultants:  GI  Gen. surgery  Procedures:  None  Antibiotics:  Zosyn---->3/1--->3/2  Cipro/[place]-----3/2   Objective: Filed Vitals:   12/02/15 2205 12/03/15 0549  BP: 119/69 124/65  Pulse: 81 88  Temp: 98.1 F (36.7 C) 98.2 F (36.8 C)  Resp: 18 16    Intake/Output Summary (Last 24 hours) at 12/03/15 1154 Last data filed at 12/03/15 1141  Gross per 24 hour  Intake    160 ml  Output   3275 ml  Net  -3115 ml   Filed Weights   11/30/15 2304  Weight: 83.462 kg (184 lb)    Exam: General: Alert and awake, oriented x3, not in any acute distress. HEENT: anicteric sclera, pupils  reactive to light and accommodation, EOMI CVS: S1-S2 clear, no murmur rubs or gallops Chest: clear to auscultation bilaterally, no wheezing, rales or rhonchi Abdomen: soft nontender, nondistended, normal bowel sounds, no organomegaly Extremities: no cyanosis, clubbing or edema noted bilaterally Neuro: Cranial nerves II-XII intact, no focal neurological deficits  Data Reviewed: Basic Metabolic Panel:  Recent Labs Lab 11/30/15 1739 12/01/15 0434  NA 141 142  K 3.5 3.5  CL 104 110  CO2 28 26  GLUCOSE 108* 112*  BUN 17 13  CREATININE 1.08 1.07  CALCIUM 8.6* 7.7*   Liver Function Tests:  Recent Labs Lab 12/01/15 0434  AST 25  ALT 30  ALKPHOS 58  BILITOT 1.3*  PROT 4.9*  ALBUMIN 2.3*   No results for input(s): LIPASE, AMYLASE in the last 168 hours. No results for input(s): AMMONIA in the last 168 hours. CBC:  Recent Labs Lab 11/30/15 1739 12/01/15 0434  WBC 8.5 5.5  NEUTROABS 7.0 4.4  HGB 16.3 13.8  HCT 48.9 42.3  MCV 88.4 90.2  PLT 87* 78*   Cardiac Enzymes: No results for input(s): CKTOTAL, CKMB, CKMBINDEX, TROPONINI in the last 168 hours. BNP (last 3 results) No results for input(s): BNP in the last 8760 hours.  ProBNP (last 3 results) No results for input(s): PROBNP in the last 8760 hours.  CBG:  Recent Labs Lab 12/02/15 0507 12/02/15 1206 12/02/15 1734 12/02/15 2348 12/03/15 0557  GLUCAP 85 79 85 90 90    Micro No results found for this or any previous visit (from the past 240 hour(s)).   Studies: No results found.  Scheduled Meds: . ciprofloxacin  400 mg Intravenous Q12H  . fluticasone  1 spray Each Nare Daily  . lip balm  1 application Topical BID  . metronidazole  500 mg Intravenous Q6H   Continuous Infusions: . dextrose 5 % and 0.9% NaCl 1,000 mL with potassium chloride 20 mEq infusion 100 mL/hr at 12/03/15 1141       Time spent: 35 minutes    [facility] A  [facility] Pager [phone] If 7PM-7AM, please contact  night-coverage at [email or web address], password [facility] 12/03/2015, 11:54 AM  LOS: 3 days

## 2015-12-03 NOTE — Progress Notes (Signed)
[facility] Office:  [phone] General Surgery Progress Note   LOS: 3 days  POD -     Assessment/Plan: 1.  Crohn's disease  2.  Distal ileal contained perforation  On Cipro/Flagyl - 3/2 >>>  Followed by Dr. Web designer in hospital (normally sees Dr. Mellody Memos)  Overall is better.  Continue NPO and antibiotics.  3.  DVT prophylaxis - hold for platelets 4.  Thrombocytopenia  Plts - 78,000 - 12/01/2015   Principal Problem:   Ileal perforation from Crohn's ileitis Active Problems:   Crohn's ileitis ([place])   Subjective:  Feels better.  Still has RLQ pain. Girlfriend, Charlean Merl, in room with patient.  Objective:   Filed Vitals:   12/02/15 2205 12/03/15 0549  BP: 119/69 124/65  Pulse: 81 88  Temp: 98.1 F (36.7 C) 98.2 F (36.8 C)  Resp: 18 16     Intake/Output from previous day:  03/03 0701 - 03/04 0700 In: 600 [IV Piggyback:600] Out: [id number] [Urine:3450]  Intake/Output this shift:  Total I/O In: 60 [P.O.:60] Out: 450 [Urine:450]   Physical Exam:   General: WN WM who is alert and oriented.    HEENT: Normal. Pupils equal. .   Lungs: Clear   Abdomen: Soft, though has mass effect in RLQ.  He is still fairly tender there.   Lab Results:    Recent Labs  11/30/15 1739 12/01/15 0434  WBC 8.5 5.5  HGB 16.3 13.8  HCT 48.9 42.3  PLT 87* 78*    BMET   Recent Labs  11/30/15 1739 12/01/15 0434  NA 141 142  K 3.5 3.5  CL 104 110  CO2 28 26  GLUCOSE 108* 112*  BUN 17 13  CREATININE 1.08 1.07  CALCIUM 8.6* 7.7*    PT/INR  No results for input(s): LABPROT, INR in the last 72 hours.  ABG  No results for input(s): PHART, HCO3 in the last 72 hours.  Invalid input(s): PCO2, PO2   Studies/Results:  No results found.   Anti-infectives:   Anti-infectives    Start     Dose/Rate Route Frequency Ordered Stop   12/01/15 1800  ciprofloxacin (CIPRO) IVPB 400 mg     400 mg 200 mL/hr over 60 Minutes Intravenous Every 12 hours 12/01/15 1657     12/01/15 1800  metroNIDAZOLE (FLAGYL) IVPB 500 mg     500 mg 100 mL/hr over 60 Minutes Intravenous Every 6 hours 12/01/15 1657     12/01/15 0600  piperacillin-tazobactam (ZOSYN) IVPB 3.375 g  Status:  Discontinued     3.375 g 12.5 mL/hr over 240 Minutes Intravenous 3 times per day 12/01/15 0459 12/01/15 1701   11/30/15 2030  piperacillin-tazobactam (ZOSYN) IVPB 3.375 g     3.375 g 100 mL/hr over 30 Minutes Intravenous  Once 11/30/15 2019 11/30/15 2113   11/30/15 2015  piperacillin-tazobactam (ZOSYN) IVPB 3.375 g  Status:  Discontinued     3.375 g 12.5 mL/hr over 240 Minutes Intravenous  Once 11/30/15 2003 11/30/15 2019      Alphonsa Overall, MD, FACS Pager: [facility] Office: [phone] 12/03/2015

## 2015-12-04 LAB — BASIC METABOLIC PANEL
Anion gap: 8 (ref 5–15)
BUN: 6 mg/dL (ref 6–20)
CO2: 22 mmol/L (ref 22–32)
Calcium: 8.1 mg/dL — ABNORMAL LOW (ref 8.9–10.3)
Chloride: 110 mmol/L (ref 101–111)
Creatinine, Ser: 0.76 mg/dL (ref 0.61–1.24)
GFR calc Af Amer: >60 mL/min (ref >60)
GFR calc non Af Amer: >60 mL/min (ref >60)
Glucose, Bld: 105 mg/dL — ABNORMAL HIGH (ref 65–99)
Potassium: 3.7 mmol/L (ref 3.5–5.1)
Sodium: 140 mmol/L (ref 135–145)

## 2015-12-04 LAB — CBC
HCT: 41.4 % (ref 39.0–52.0)
Hemoglobin: 14.2 g/dL (ref 13.0–17.0)
MCH: 29.4 pg (ref 26.0–34.0)
MCHC: 34.3 g/dL (ref 30.0–36.0)
MCV: 85.7 fL (ref 78.0–100.0)
Platelets: 138 10*3/uL — ABNORMAL LOW (ref 150–400)
RBC: 4.83 MIL/uL (ref 4.22–5.81)
RDW: 12.9 % (ref 11.5–15.5)
WBC: 7.8 10*3/uL (ref 4.0–10.5)

## 2015-12-04 NOTE — Progress Notes (Signed)
[facility] Office:  [phone] General Surgery Progress Note   LOS: 4 days  POD -     Assessment/Plan: 1.  Crohn's disease  2.  Distal ileal contained perforation  On Cipro/Flagyl - 3/2 >>>  Followed by Dr. Web designer in hospital (normally sees Dr. Mellody Memos)  Doing well.  Continue NPO and antibiotics. Has mass effect in RLQ - abscess vs Crohn's disease vs both - will probably need repeat CT scan on Monday or Tuesday (last CT scan on 3/1)  3.  DVT prophylaxis - hold for platelets 4.  Thrombocytopenia  Plts - 138,000 - 12/04/2015   Principal Problem:   Ileal perforation from Crohn's ileitis Active Problems:   Crohn's ileitis ([place])   GERD (gastroesophageal reflux disease)   Thrombocytopenia ([facility])   Subjective:  Feels better.  Had two bowel movements.  Still sore in RLQ, but has gotten better every day.  Objective:   Filed Vitals:   12/03/15 2127 12/04/15 0529  BP: 122/71 118/70  Pulse: 77 83  Temp: 99 F (37.2 C) 98.3 F (36.8 C)  Resp: 16 16     Intake/Output from previous day:  03/04 0701 - 03/05 0700 In: 2511.7 [P.O.:180; I.V.:1431.7; IV Piggyback:900] Out: 2900 [Urine:2900]  Intake/Output this shift:      Physical Exam:   General: WN WM who is alert and oriented.    HEENT: Normal. Pupils equal. .   Lungs: Clear   Abdomen: Soft, though has mass effect in RLQ.  This mass is mildly tender.   Lab Results:     Recent Labs  12/04/15 0515  WBC 7.8  HGB 14.2  HCT 41.4  PLT 138*    BMET    Recent Labs  12/04/15 0515  NA 140  K 3.7  CL 110  CO2 22  GLUCOSE 105*  BUN 6  CREATININE 0.76  CALCIUM 8.1*    PT/INR  No results for input(s): LABPROT, INR in the last 72 hours.  ABG  No results for input(s): PHART, HCO3 in the last 72 hours.  Invalid input(s): PCO2, PO2   Studies/Results:  No results found.   Anti-infectives:   Anti-infectives    Start     Dose/Rate Route Frequency Ordered Stop   12/01/15 1800   ciprofloxacin (CIPRO) IVPB 400 mg     400 mg 200 mL/hr over 60 Minutes Intravenous Every 12 hours 12/01/15 1657     12/01/15 1800  metroNIDAZOLE (FLAGYL) IVPB 500 mg     500 mg 100 mL/hr over 60 Minutes Intravenous Every 6 hours 12/01/15 1657     12/01/15 0600  piperacillin-tazobactam (ZOSYN) IVPB 3.375 g  Status:  Discontinued     3.375 g 12.5 mL/hr over 240 Minutes Intravenous 3 times per day 12/01/15 0459 12/01/15 1701   11/30/15 2030  piperacillin-tazobactam (ZOSYN) IVPB 3.375 g     3.375 g 100 mL/hr over 30 Minutes Intravenous  Once 11/30/15 2019 11/30/15 2113   11/30/15 2015  piperacillin-tazobactam (ZOSYN) IVPB 3.375 g  Status:  Discontinued     3.375 g 12.5 mL/hr over 240 Minutes Intravenous  Once 11/30/15 2003 11/30/15 2019      Alphonsa Overall, MD, FACS Pager: [facility] Office: [phone] 12/04/2015

## 2015-12-04 NOTE — Progress Notes (Signed)
[facility] PROGRESS NOTE   Nathan Shaw [id number] DOB: May 27, 1956 DOA: 11/30/2015 PCP: Gennette Pac, MD  HPI/Subjective: No new complaints, continue current medical management Likely to repeat CT on Monday or Tuesday.  Assessment/Plan: Principal Problem:   Ileal perforation from Crohn's ileitis Active Problems:   Crohn's ileitis ([facility])   GERD (gastroesophageal reflux disease)   Thrombocytopenia ([facility])   Small bowel perforation with contained phlegmon/abscess Likely secondary to terminal ileal Crohn's disease with contained abscess/perforation. CT suggested extensive involvement of the terminal ileum with the Crohn's disease. General surgery consulted, recommended conservative management for now. Wasn't Zosyn, switched to Cipro/Flagyl by general surgery, continue continue current regimen Repeat CT scan on Monday or Tuesday.  Crohn's ileitis Patient is not on any medication at home, gastroenterology consulted. On antibiotics, improving, no worsening, continue to hold the steroids.  GERD Patient asking for Prilosec placed on Protonix.  Thrombocytopenia Unclear etiology, cannot rule out infection as cause. This is improved, currently adequate at 138.   Code Status: Full Code Family Communication: Plan discussed with the patient. Disposition Plan: Remains inpatient Diet: Diet NPO time specified Except for: Ice Chips, Sips with Meds  Consultants:  GI  Gen. surgery  Procedures:  None  Antibiotics:  Zosyn---->3/1--->3/2  Cipro/Metro-----3/2   Objective: Filed Vitals:   12/03/15 2127 12/04/15 0529  BP: 122/71 118/70  Pulse: 77 83  Temp: 99 F (37.2 C) 98.3 F (36.8 C)  Resp: 16 16    Intake/Output Summary (Last 24 hours) at 12/04/15 1211 Last data filed at 12/04/15 1100  Gross per 24 hour  Intake 2351.67 ml  Output   2875 ml  Net -523.33 ml   Filed Weights   11/30/15 2304  Weight: 83.462 kg (184 lb)    Exam: General: Alert and  awake, oriented x3, not in any acute distress. HEENT: anicteric sclera, pupils reactive to light and accommodation, EOMI CVS: S1-S2 clear, no murmur rubs or gallops Chest: clear to auscultation bilaterally, no wheezing, rales or rhonchi Abdomen: soft nontender, nondistended, normal bowel sounds, no organomegaly Extremities: no cyanosis, clubbing or edema noted bilaterally Neuro: Cranial nerves II-XII intact, no focal neurological deficits  Data Reviewed: Basic Metabolic Panel:  Recent Labs Lab 11/30/15 1739 12/01/15 0434 12/04/15 0515  NA 141 142 140  K 3.5 3.5 3.7  CL 104 110 110  CO2 28 26 22   GLUCOSE 108* 112* 105*  BUN 17 13 6   CREATININE 1.08 1.07 0.76  CALCIUM 8.6* 7.7* 8.1*   Liver Function Tests:  Recent Labs Lab 12/01/15 0434  AST 25  ALT 30  ALKPHOS 58  BILITOT 1.3*  PROT 4.9*  ALBUMIN 2.3*   No results for input(s): LIPASE, AMYLASE in the last 168 hours. No results for input(s): AMMONIA in the last 168 hours. CBC:  Recent Labs Lab 11/30/15 1739 12/01/15 0434 12/04/15 0515  WBC 8.5 5.5 7.8  NEUTROABS 7.0 4.4  --   HGB 16.3 13.8 14.2  HCT 48.9 42.3 41.4  MCV 88.4 90.2 85.7  PLT 87* 78* 138*   Cardiac Enzymes: No results for input(s): CKTOTAL, CKMB, CKMBINDEX, TROPONINI in the last 168 hours. BNP (last 3 results) No results for input(s): BNP in the last 8760 hours.  ProBNP (last 3 results) No results for input(s): PROBNP in the last 8760 hours.  CBG:  Recent Labs Lab 12/02/15 0507 12/02/15 1206 12/02/15 1734 12/02/15 2348 12/03/15 0557  GLUCAP 85 79 85 90 90    Micro No results found for this or any previous visit (  from the past 240 hour(s)).   Studies: No results found.  Scheduled Meds: . ciprofloxacin  400 mg Intravenous Q12H  . fluticasone  1 spray Each Nare Daily  . lip balm  1 application Topical BID  . metronidazole  500 mg Intravenous Q6H  . pantoprazole (PROTONIX) IV  40 mg Intravenous QHS   Continuous Infusions: .  dextrose 5 % and 0.9% NaCl 1,000 mL with potassium chloride 20 mEq infusion 100 mL/hr at 12/03/15 2355       Time spent: 35 minutes    [facility]  [facility] Pager [phone] If 7PM-7AM, please contact night-coverage at [email or web address], password [facility] 12/04/2015, 12:11 PM  LOS: 4 days

## 2015-12-04 NOTE — Progress Notes (Signed)
Patient ID: Nathan Shaw, male   DOB: 08-25-1956, 60 y.o.   MRN: [id number] [facility] Gastroenterology Progress Note  Nathan Shaw 60 y.o. [birth year]   Subjective: Reports loose nonbloody stools. Abdominal pain less. Ambulating in halls yesterday and today. Girlfriend at bedside.  Objective: Vital signs in last 24 hours: Filed Vitals:   12/04/15 0529 12/04/15 1300  BP: 118/70 124/70  Pulse: 83 80  Temp: 98.3 F (36.8 C) 99 F (37.2 C)  Resp: 16 18    Physical Exam: Gen: alert, no acute distress HEENT: anicteric CV: RRR Chest: CTA B Abd: RLQ tenderness with minimal guarding, soft, nondistended, +BS Ext: no edema  Lab Results:  Recent Labs  12/04/15 0515  NA 140  K 3.7  CL 110  CO2 22  GLUCOSE 105*  BUN 6  CREATININE 0.76  CALCIUM 8.1*   No results for input(s): AST, ALT, ALKPHOS, BILITOT, PROT, ALBUMIN in the last 72 hours.  Recent Labs  12/04/15 0515  WBC 7.8  HGB 14.2  HCT 41.4  MCV 85.7  PLT 138*   No results for input(s): LABPROT, INR in the last 72 hours.    Assessment/Plan: Crohn's disease with distal ileal perforation and abscess - continue IV Abx. Repeat CT tomorrow to reassess abscess and if abscess has improved then consider transitioning to oral antibiotics when diet ok with surgery. NPO. Supportive care. Platelets improving. [facility] to f/u tomorrow.   [place] C. 12/04/2015, 4:46 PM  Pager [phone]  If no answer or after 5 PM call [phone]

## 2015-12-05 ENCOUNTER — Inpatient Hospital Stay (HOSPITAL_COMMUNITY): Payer: BLUE CROSS/BLUE SHIELD

## 2015-12-05 LAB — BASIC METABOLIC PANEL
Anion gap: 8 (ref 5–15)
BUN: <5 mg/dL — ABNORMAL LOW (ref 6–20)
CO2: 21 mmol/L — ABNORMAL LOW (ref 22–32)
Calcium: 8 mg/dL — ABNORMAL LOW (ref 8.9–10.3)
Chloride: 109 mmol/L (ref 101–111)
Creatinine, Ser: 0.77 mg/dL (ref 0.61–1.24)
GFR calc Af Amer: >60 mL/min (ref >60)
GFR calc non Af Amer: >60 mL/min (ref >60)
Glucose, Bld: 95 mg/dL (ref 65–99)
Potassium: 3.3 mmol/L — ABNORMAL LOW (ref 3.5–5.1)
Sodium: 138 mmol/L (ref 135–145)

## 2015-12-05 LAB — GLUCOSE, CAPILLARY: Glucose-Capillary: 75 mg/dL (ref 65–99)

## 2015-12-05 MED ORDER — DEXTROSE-NACL 5-0.9 % IV SOLN
INTRAVENOUS | Status: DC
  Administered 2015-12-05 – 2015-12-06 (×2): via INTRAVENOUS
  Filled 2015-12-05 (×2): qty 1000

## 2015-12-05 MED ORDER — IOHEXOL 300 MG/ML  SOLN
25.0000 mL | INTRAMUSCULAR | Status: AC
  Administered 2015-12-05 (×2): 25 mL via ORAL

## 2015-12-05 MED ORDER — POTASSIUM CHLORIDE 10 MEQ/100ML IV SOLN
10.0000 meq | INTRAVENOUS | Status: AC
  Administered 2015-12-05 (×3): 10 meq via INTRAVENOUS
  Filled 2015-12-05 (×3): qty 100

## 2015-12-05 MED ORDER — TRACE MINERALS CR-CU-MN-SE-ZN 10-1000-500-60 MCG/ML IV SOLN
INTRAVENOUS | Status: AC
  Administered 2015-12-05: 18:00:00 via INTRAVENOUS
  Filled 2015-12-05: qty 960

## 2015-12-05 MED ORDER — INSULIN ASPART 100 UNIT/ML ~~LOC~~ SOLN
0.0000 [IU] | Freq: Three times a day (TID) | SUBCUTANEOUS | Status: DC
  Administered 2015-12-06 – 2015-12-09 (×4): 1 [IU] via SUBCUTANEOUS

## 2015-12-05 MED ORDER — IOHEXOL 300 MG/ML  SOLN
100.0000 mL | Freq: Once | INTRAMUSCULAR | Status: AC | PRN
  Administered 2015-12-05: 100 mL via INTRAVENOUS

## 2015-12-05 MED ORDER — PIPERACILLIN-TAZOBACTAM 3.375 G IVPB
3.3750 g | Freq: Three times a day (TID) | INTRAVENOUS | Status: DC
  Administered 2015-12-06 – 2015-12-10 (×13): 3.375 g via INTRAVENOUS
  Filled 2015-12-05 (×14): qty 50

## 2015-12-05 MED ORDER — SODIUM CHLORIDE 0.9% FLUSH
10.0000 mL | INTRAVENOUS | Status: DC | PRN
  Administered 2015-12-06 – 2015-12-12 (×5): 10 mL
  Filled 2015-12-05 (×5): qty 40

## 2015-12-05 MED ORDER — FAT EMULSION 20 % IV EMUL
240.0000 mL | INTRAVENOUS | Status: AC
  Administered 2015-12-05: 240 mL via INTRAVENOUS
  Filled 2015-12-05: qty 250

## 2015-12-05 MED ORDER — PIPERACILLIN-TAZOBACTAM 3.375 G IVPB
3.3750 g | INTRAVENOUS | Status: AC
  Administered 2015-12-05: 3.375 g via INTRAVENOUS
  Filled 2015-12-05: qty 50

## 2015-12-05 NOTE — Progress Notes (Signed)
[facility] Progress Note     Subjective: Pt feels better than last week.  No N/V, pain significantly improved.  Ambulating well OOB.  CT scan done this morning and he's disappointed its worse.  Having flatus and BM's.    Objective: Vital signs in last 24 hours: Temp:  [98.4 F (36.9 C)-99 F (37.2 C)] 98.4 F (36.9 C) (03/06 0525) Pulse Rate:  [69-85] 69 (03/06 0525) Resp:  [18] 18 (03/06 0525) BP: (109-124)/(69-71) 123/71 mmHg (03/06 0525) SpO2:  [97 %-99 %] 97 % (03/06 0525) Last BM Date: 12/05/15  Intake/Output from previous day: 03/05 0701 - 03/06 0700 In: 3440 [P.O.:240; I.V.:2400; IV Piggyback:800] Out: 2100 [Urine:2100] Intake/Output this shift: Total I/O In: -  Out: 800 [Urine:800]  PE: Gen:  Alert, NAD, pleasant Abd: Soft, mild distension, mild tenderness, +BS, no HSM, no scars noted   Lab Results:   Recent Labs  12/04/15 0515  WBC 7.8  HGB 14.2  HCT 41.4  PLT 138*   BMET  Recent Labs  12/04/15 0515 12/05/15 0425  NA 140 138  K 3.7 3.3*  CL 110 109  CO2 22 21*  GLUCOSE 105* 95  BUN 6 <5*  CREATININE 0.76 0.77  CALCIUM 8.1* 8.0*   PT/INR No results for input(s): LABPROT, INR in the last 72 hours. CMP     Component Value Date/Time   NA 138 12/05/2015 0425   K 3.3* 12/05/2015 0425   CL 109 12/05/2015 0425   CO2 21* 12/05/2015 0425   GLUCOSE 95 12/05/2015 0425   BUN <5* 12/05/2015 0425   CREATININE 0.77 12/05/2015 0425   CALCIUM 8.0* 12/05/2015 0425   PROT 4.9* 12/01/2015 0434   ALBUMIN 2.3* 12/01/2015 0434   AST 25 12/01/2015 0434   ALT 30 12/01/2015 0434   ALKPHOS 58 12/01/2015 0434   BILITOT 1.3* 12/01/2015 0434   GFRNONAA >60 12/05/2015 0425   GFRAA >60 12/05/2015 0425   Lipase  No results found for: LIPASE     Studies/Results: Ct Abdomen Pelvis W Contrast  12/05/2015  CLINICAL DATA:  Abscess, Crohn disease. EXAM: CT ABDOMEN AND PELVIS WITH CONTRAST TECHNIQUE: Multidetector CT imaging of the abdomen and  pelvis was performed using the standard protocol following bolus administration of intravenous contrast. CONTRAST:  182mL OMNIPAQUE IOHEXOL 300 MG/ML  SOLN COMPARISON:  11/30/2015. FINDINGS: Lower chest: Lung bases show no acute findings. Heart size normal. No pericardial or pleural effusion. Hepatobiliary: 4 mm low-attenuation lesion in the dome of the liver is too small to characterize. Liver and gallbladder are otherwise unremarkable. No biliary ductal dilatation. Pancreas: Negative. Spleen: Negative. Adrenals/Urinary Tract: Adrenal glands and right kidney are unremarkable. Sub cm low-attenuation lesion in the left kidney is too small to characterize but statistically, likely represents a cyst. Ureters are decompressed. Bladder is unremarkable. Stomach/Bowel: Stomach is unremarkable. Scattered areas of suspected small bowel narrowing, as before. There is marked thickening of the distal/terminal ileum with hypervascular surrounding mesentery. Extraluminal collection of air, contrast and marked haziness in the ileocolic mesentery measures roughly 4.2 x 8.3 x 7.6 cm (series 2, image 59 and coronal image 40). The opacified collection of fluid and air measures 1.8 cm (2/66). An opacified tract is seen extending from the distal ileum, best imaged on coronal image 37. Findings are progressive from 11/30/2015. Colon is unremarkable. Vascular/Lymphatic: Atherosclerotic calcification of the arterial vasculature without abdominal aortic aneurysm. Lymph nodes in the ileocolic mesentery measure up to 9 mm, similar. Otherwise, no pathologically enlarged lymph nodes. Reproductive:  Prostate is normal in size. Other: Small pelvic free fluid. Mesenteries and peritoneum are otherwise unremarkable. Musculoskeletal: No worrisome lytic or sclerotic lesions. IMPRESSION: 1. Progressive changes of active Crohn disease involving the distal/terminal ileum with a persistent leak/ rupture and enlarging mesenteric abscess. 2. Small pelvic  free fluid. Electronically Signed   By: Lorin Picket M.D.   On: 12/05/2015 10:49    Anti-infectives: Anti-infectives    Start     Dose/Rate Route Frequency Ordered Stop   12/01/15 1800  ciprofloxacin (CIPRO) IVPB 400 mg     400 mg 200 mL/hr over 60 Minutes Intravenous Every 12 hours 12/01/15 1657     12/01/15 1800  metroNIDAZOLE (FLAGYL) IVPB 500 mg     500 mg 100 mL/hr over 60 Minutes Intravenous Every 6 hours 12/01/15 1657     12/01/15 0600  piperacillin-tazobactam (ZOSYN) IVPB 3.375 g  Status:  Discontinued     3.375 g 12.5 mL/hr over 240 Minutes Intravenous 3 times per day 12/01/15 0459 12/01/15 1701   11/30/15 2030  piperacillin-tazobactam (ZOSYN) IVPB 3.375 g     3.375 g 100 mL/hr over 30 Minutes Intravenous  Once 11/30/15 2019 11/30/15 2113   11/30/15 2015  piperacillin-tazobactam (ZOSYN) IVPB 3.375 g  Status:  Discontinued     3.375 g 12.5 mL/hr over 240 Minutes Intravenous  Once 11/30/15 2003 11/30/15 2019       Assessment/Plan HD #6 Crohn's disease Distal ileal perforation with abscess (4.x x 8.3 x 7.6cm) -Medical management for now: Bowel rest, IVF hydration, pain control, antiemetics. Currently with mild ileus. -Recommend we start PICC and TPN -IV antibiotics - Cipro/Flagyl Day #5, Given worsening would switch back to Zosyn Day #1 -GI advises holding off on treatment with steroids for now -No plans for surgical management/intervention at this time. Repeat CT (3/6) shows progressive changes of active Crohn's disease involving distal and terminal ileum with persistent leak/rupture and enlarging mesenteric abscess.  -This is a difficult situation because the CT looks worse but the patient feels significantly better.  Will await GI's perspective, but with expanding abscess I'm not sure they will want start steroids.   -Will see if IR to see if the abscess is ammenable to aspiration or drainage -The patient would like to be conservative in his management and would like  to avoid a surgery if possible.    LOS: 5 days    Nat Christen 12/05/2015, 11:18 AM Pager: [phone]  (7am - 4:30pm M-F; 7am - 11:30am Sa/Su)

## 2015-12-05 NOTE — Progress Notes (Signed)
[facility] PROGRESS NOTE   Nathan Shaw [id number] DOB: 20-Sep-1956 DOA: 11/30/2015 PCP: Gennette Pac, MD  HPI/Subjective: No fever or chills, less abdominal tenderness. Although clinically he is improving, CT scan showed enlarging mesenteric abscess, await general surgery recommendation.   Assessment/Plan: Principal Problem:   Ileal perforation from Crohn's ileitis Active Problems:   Crohn's ileitis ([facility])   GERD (gastroesophageal reflux disease)   Thrombocytopenia ([facility])   Small bowel perforation with contained phlegmon/abscess Likely secondary to terminal ileal Crohn's disease with contained abscess/perforation. CT suggested extensive involvement of the terminal ileum with the Crohn's disease. General surgery consulted, recommended conservative management for now. Wasn't Zosyn, switched to Cipro/Flagyl by general surgery, continue continue current regimen Repeat CT scanShowed enlarging mesenteric abscess  Crohn's ileitis Patient is not on any medication at home, gastroenterology consulted. On antibiotics, improving, no worsening, continue to hold the steroids.  GERD Patient asking for Prilosec placed on Protonix.  Thrombocytopenia Unclear etiology, cannot rule out infection as cause. This is improved, currently adequate at 138.   Code Status: Full Code Family Communication: Plan discussed with the patient. Disposition Plan: Remains inpatient Diet: Diet NPO time specified Except for: Ice Chips, Sips with Meds  Consultants:  GI  Gen. surgery  Procedures:  None  Antibiotics:  Zosyn---->3/1--->3/2  Cipro/Metro-----3/2   Objective: Filed Vitals:   12/04/15 2140 12/05/15 0525  BP: 109/69 123/71  Pulse: 85 69  Temp: 98.4 F (36.9 C) 98.4 F (36.9 C)  Resp: 18 18    Intake/Output Summary (Last 24 hours) at 12/05/15 1100 Last data filed at 12/05/15 1000  Gross per 24 hour  Intake   2240 ml  Output   2300 ml  Net    -60 ml   Filed  Weights   11/30/15 2304  Weight: 83.462 kg (184 lb)    Exam: General: Alert and awake, oriented x3, not in any acute distress. HEENT: anicteric sclera, pupils reactive to light and accommodation, EOMI CVS: S1-S2 clear, no murmur rubs or gallops Chest: clear to auscultation bilaterally, no wheezing, rales or rhonchi Abdomen: soft nontender, nondistended, normal bowel sounds, no organomegaly Extremities: no cyanosis, clubbing or edema noted bilaterally Neuro: Cranial nerves II-XII intact, no focal neurological deficits  Data Reviewed: Basic Metabolic Panel:  Recent Labs Lab 11/30/15 1739 12/01/15 0434 12/04/15 0515 12/05/15 0425  NA 141 142 140 138  K 3.5 3.5 3.7 3.3*  CL 104 110 110 109  CO2 28 26 22  21*  GLUCOSE 108* 112* 105* 95  BUN 17 13 6  <5*  CREATININE 1.08 1.07 0.76 0.77  CALCIUM 8.6* 7.7* 8.1* 8.0*   Liver Function Tests:  Recent Labs Lab 12/01/15 0434  AST 25  ALT 30  ALKPHOS 58  BILITOT 1.3*  PROT 4.9*  ALBUMIN 2.3*   No results for input(s): LIPASE, AMYLASE in the last 168 hours. No results for input(s): AMMONIA in the last 168 hours. CBC:  Recent Labs Lab 11/30/15 1739 12/01/15 0434 12/04/15 0515  WBC 8.5 5.5 7.8  NEUTROABS 7.0 4.4  --   HGB 16.3 13.8 14.2  HCT 48.9 42.3 41.4  MCV 88.4 90.2 85.7  PLT 87* 78* 138*   Cardiac Enzymes: No results for input(s): CKTOTAL, CKMB, CKMBINDEX, TROPONINI in the last 168 hours. BNP (last 3 results) No results for input(s): BNP in the last 8760 hours.  ProBNP (last 3 results) No results for input(s): PROBNP in the last 8760 hours.  CBG:  Recent Labs Lab 12/02/15 0507 12/02/15 1206 12/02/15 1734 12/02/15 2348  12/03/15 0557  GLUCAP 85 79 85 90 90    Micro No results found for this or any previous visit (from the past 240 hour(s)).   Studies: Ct Abdomen Pelvis W Contrast  12/05/2015  CLINICAL DATA:  Abscess, Crohn disease. EXAM: CT ABDOMEN AND PELVIS WITH CONTRAST TECHNIQUE: Multidetector  CT imaging of the abdomen and pelvis was performed using the standard protocol following bolus administration of intravenous contrast. CONTRAST:  136mL OMNIPAQUE IOHEXOL 300 MG/ML  SOLN COMPARISON:  11/30/2015. FINDINGS: Lower chest: Lung bases show no acute findings. Heart size normal. No pericardial or pleural effusion. Hepatobiliary: 4 mm low-attenuation lesion in the dome of the liver is too small to characterize. Liver and gallbladder are otherwise unremarkable. No biliary ductal dilatation. Pancreas: Negative. Spleen: Negative. Adrenals/Urinary Tract: Adrenal glands and right kidney are unremarkable. Sub cm low-attenuation lesion in the left kidney is too small to characterize but statistically, likely represents a cyst. Ureters are decompressed. Bladder is unremarkable. Stomach/Bowel: Stomach is unremarkable. Scattered areas of suspected small bowel narrowing, as before. There is marked thickening of the distal/terminal ileum with hypervascular surrounding mesentery. Extraluminal collection of air, contrast and marked haziness in the ileocolic mesentery measures roughly 4.2 x 8.3 x 7.6 cm (series 2, image 59 and coronal image 40). The opacified collection of fluid and air measures 1.8 cm (2/66). An opacified tract is seen extending from the distal ileum, best imaged on coronal image 37. Findings are progressive from 11/30/2015. Colon is unremarkable. Vascular/Lymphatic: Atherosclerotic calcification of the arterial vasculature without abdominal aortic aneurysm. Lymph nodes in the ileocolic mesentery measure up to 9 mm, similar. Otherwise, no pathologically enlarged lymph nodes. Reproductive: Prostate is normal in size. Other: Small pelvic free fluid. Mesenteries and peritoneum are otherwise unremarkable. Musculoskeletal: No worrisome lytic or sclerotic lesions. IMPRESSION: 1. Progressive changes of active Crohn disease involving the distal/terminal ileum with a persistent leak/ rupture and enlarging  mesenteric abscess. 2. Small pelvic free fluid. Electronically Signed   By: Lorin Picket M.D.   On: 12/05/2015 10:49    Scheduled Meds: . ciprofloxacin  400 mg Intravenous Q12H  . fluticasone  1 spray Each Nare Daily  . lip balm  1 application Topical BID  . metronidazole  500 mg Intravenous Q6H  . pantoprazole (PROTONIX) IV  40 mg Intravenous QHS   Continuous Infusions: . dextrose 5 % and 0.9% NaCl 1,000 mL with potassium chloride 20 mEq infusion 100 mL/hr at 12/04/15 2146       Time spent: 35 minutes    [facility] A  [facility] Pager [phone] If 7PM-7AM, please contact night-coverage at [email or web address], password [facility] 12/05/2015, 11:00 AM  LOS: 5 days

## 2015-12-05 NOTE — Progress Notes (Signed)
Peripherally Inserted Central Catheter/Midline Placement  The IV Nurse has discussed with the patient and/or persons authorized to consent for the patient, the purpose of this procedure and the potential benefits and risks involved with this procedure.  The benefits include less needle sticks, lab draws from the catheter and patient may be discharged home with the catheter.  Risks include, but not limited to, infection, bleeding, blood clot (thrombus formation), and puncture of an artery; nerve damage and irregular heat beat.  Alternatives to this procedure were also discussed.  PICC/Midline Placement Documentation        Nathan Shaw 12/05/2015, 2:39 PM

## 2015-12-05 NOTE — Progress Notes (Signed)
PARENTERAL NUTRITION CONSULT NOTE - INITIAL  Pharmacy Consult for TPN Indication: crohns with ileal perforation and abscess currently NPO  No Known Allergies  Patient Measurements: Height: 5\' 11"  (180.3 cm) Weight: 184 lb (83.462 kg) IBW/kg (Calculated) : 75.3 Adjusted Body Weight:  Usual Weight:   Vital Signs: Temp: 98.4 F (36.9 C) (03/06 0525) Temp Source: Oral (03/06 0525) BP: 123/71 mmHg (03/06 0525) Pulse Rate: 69 (03/06 0525) Intake/Output from previous day: 03/05 0701 - 03/06 0700 In: [id number] [P.O.:240; I.V.:2400; IV Piggyback:800] Out: 2100 [Urine:2100] Intake/Output from this shift: Total I/O In: -  Out: 800 [Urine:800]  Labs:  Recent Labs  12/04/15 0515  WBC 7.8  HGB 14.2  HCT 41.4  PLT 138*     Recent Labs  12/04/15 0515 12/05/15 0425  NA 140 138  K 3.7 3.3*  CL 110 109  CO2 22 21*  GLUCOSE 105* 95  BUN 6 <5*  CREATININE 0.76 0.77  CALCIUM 8.1* 8.0*   Estimated Creatinine Clearance: 108.5 mL/min (by C-G formula based on Cr of 0.77).    Recent Labs  12/02/15 1734 12/02/15 2348 12/03/15 0557  GLUCAP 85 90 90    Medical History: Past Medical History  Diagnosis Date  . Crohn disease ([place]) last flare up dec 2014/ jan 2015  . Wears contact lenses     Insulin Requirements: none - no hx DM  Current Nutrition: NPO  IVF: D5 NS with 20 mEq/L at 100 ml/hr  Central access: pending PICC placement (IV team said they will place on 3/6) TPN start date:   ASSESSMENT                                                                                                          HPI: Patient is a 60 y.o M with hx Crohn's disease (off medications for 2 years) presented to the ED on 3/1 with c/o abdominal pain, n/v, and diarrhea.  Abdominal CT showed Crohn's disease with a perforation and contained abscess in the distal ileum. No surgical intervention at this time per [facility].  To start TPN while patient is NPO  Significant events:  3/6 abd CT: Progressive  changes of active Crohn disease involving the distal/terminal ileum with a persistent leak/ rupture and enlarging mesenteric abscess.  Today:   Glucose (goal <150): 95  Electrolytes: K 3.3 -- repleting with 3 runs KCL per MD, Ca 8 (no albumin)  Renal: scr wnl  LFTs: WNL   TGs: pending  Prealbumin: pending  NUTRITIONAL GOALS                                                                                             RD recs (on  3/2):  Kcal: 1900-2100 Protein: 95-105g Fluid: 2L/day  Clinimix E 5/15 at a goal rate of 83 ml/hr + 20% fat emulsion at 10 ml/hr to provide: 100 g/day protein, 1894 Kcal/day.  PLAN                                                                                                                         If PICC is placed, then at 1800 today:  Start Clinimix E 5/15 at 40 ml/hr.  20% fat emulsion at 10 ml/hr.  Plan to advance as tolerated to the goal rate.  TPN to contain standard multivitamins and trace elements.  Reduce IVF to 60 ml/hr.  Start sensitive SSI q8h   TPN lab panels on Mondays & Thursdays.  F/u daily.   Danette Weinfeld P 12/05/2015,1:08 PM

## 2015-12-05 NOTE — Progress Notes (Signed)
Pharmacy Antibiotic Note  Nathan Shaw is a 60 y.o. male admitted on 11/30/2015 with Intra-abdominal infection.  Pharmacy was consulted for Zosyn dosing, then switched to Cipro/Flagyl. 3/6 switching back to Zosyn due to worsening clinical status.   Plan: Zosyn 3.375g IV q8h (4 hour infusion).  Follow up renal fxn, culture results, and clinical course.   Height: 5\' 11"  (180.3 cm) Weight: 184 lb (83.462 kg) IBW/kg (Calculated) : 75.3  Temp (24hrs), Avg:98.6 F (37 C), Min:98.4 F (36.9 C), Max:99 F (37.2 C)   Recent Labs Lab 11/30/15 1739 12/01/15 0434 12/04/15 0515 12/05/15 0425  WBC 8.5 5.5 7.8  --   CREATININE 1.08 1.07 0.76 0.77    Estimated Creatinine Clearance: 108.5 mL/min (by C-G formula based on Cr of 0.77).    No Known Allergies  Antimicrobials this admission:  Zosyn 3/1 >> 3/2, 3/6 >>  3/2 >> Cipro >> 3/6  3/2 >> Flagyl >> 3/6   Dose adjustments this admission:  --   Microbiology results:  No cultures   Thank you for allowing pharmacy to be a part of this patient's care.  Romeo Rabon, PharmD, pager [phone]. 12/05/2015,12:38 PM.

## 2015-12-05 NOTE — Progress Notes (Signed)
[facility] Progress Note  Subjective: Just got back from CT. Feels a little better today than yesterday. Feeling some slight improvement each day.  Objective: Vital signs in last 24 hours: Temp:  [98.4 F (36.9 C)-99 F (37.2 C)] 98.4 F (36.9 C) (03/06 0525) Pulse Rate:  [69-85] 69 (03/06 0525) Resp:  [18] 18 (03/06 0525) BP: (109-124)/(69-71) 123/71 mmHg (03/06 0525) SpO2:  [97 %-99 %] 97 % (03/06 0525) Weight change:    PE: Not examined today  Lab Results: Results for orders placed or performed during the hospital encounter of 11/30/15 (from the past 24 hour(s))  Basic metabolic panel     Status: Abnormal   Collection Time: 12/05/15  4:25 AM  Result Value Ref Range   Sodium 138 135 - 145 mmol/L   Potassium 3.3 (L) 3.5 - 5.1 mmol/L   Chloride 109 101 - 111 mmol/L   CO2 21 (L) 22 - 32 mmol/L   Glucose, Bld 95 65 - 99 mg/dL   BUN <5 (L) 6 - 20 mg/dL   Creatinine, Ser 0.77 0.61 - 1.24 mg/dL   Calcium 8.0 (L) 8.9 - 10.3 mg/dL   GFR calc non Af Amer >60 >60 mL/min   GFR calc Af Amer >60 >60 mL/min   Anion gap 8 5 - 15    Studies/Results: No results found.    Assessment: Crohn's ileitis with perforation and contained abscess day 3-4 of Cipro and Flagyl. Surgery following.  Plan: Await CT scan for follow-up and reassess for need for drainage versus medical therapy. We'll continue to follow with you.    Kellie Chisolm C 12/05/2015, 10:30 AM  Pager [phone] If no answer or after 5 PM call [phone]

## 2015-12-06 LAB — COMPREHENSIVE METABOLIC PANEL
ALT: 20 U/L (ref 17–63)
AST: 17 U/L (ref 15–41)
Albumin: 2.4 g/dL — ABNORMAL LOW (ref 3.5–5.0)
Alkaline Phosphatase: 60 U/L (ref 38–126)
Anion gap: 7 (ref 5–15)
BUN: <5 mg/dL — ABNORMAL LOW (ref 6–20)
CO2: 26 mmol/L (ref 22–32)
Calcium: 8.4 mg/dL — ABNORMAL LOW (ref 8.9–10.3)
Chloride: 110 mmol/L (ref 101–111)
Creatinine, Ser: 0.76 mg/dL (ref 0.61–1.24)
GFR calc Af Amer: >60 mL/min (ref >60)
GFR calc non Af Amer: >60 mL/min (ref >60)
Glucose, Bld: 145 mg/dL — ABNORMAL HIGH (ref 65–99)
Potassium: 3.3 mmol/L — ABNORMAL LOW (ref 3.5–5.1)
Sodium: 143 mmol/L (ref 135–145)
Total Bilirubin: 0.6 mg/dL (ref 0.3–1.2)
Total Protein: 5.3 g/dL — ABNORMAL LOW (ref 6.5–8.1)

## 2015-12-06 LAB — CBC WITH DIFFERENTIAL/PLATELET
Basophils Absolute: 0 10*3/uL (ref 0.0–0.1)
Basophils Relative: 0 %
Eosinophils Absolute: 0.2 10*3/uL (ref 0.0–0.7)
Eosinophils Relative: 2 %
HCT: 42.8 % (ref 39.0–52.0)
Hemoglobin: 14.4 g/dL (ref 13.0–17.0)
Lymphocytes Relative: 10 %
Lymphs Abs: 1 10*3/uL (ref 0.7–4.0)
MCH: 29.1 pg (ref 26.0–34.0)
MCHC: 33.6 g/dL (ref 30.0–36.0)
MCV: 86.6 fL (ref 78.0–100.0)
Monocytes Absolute: 0.8 10*3/uL (ref 0.1–1.0)
Monocytes Relative: 8 %
Neutro Abs: 8.2 10*3/uL — ABNORMAL HIGH (ref 1.7–7.7)
Neutrophils Relative %: 80 %
Platelets: 239 10*3/uL (ref 150–400)
RBC: 4.94 MIL/uL (ref 4.22–5.81)
RDW: 13.1 % (ref 11.5–15.5)
WBC: 10.2 10*3/uL (ref 4.0–10.5)

## 2015-12-06 LAB — PHOSPHORUS: Phosphorus: 2.8 mg/dL (ref 2.5–4.6)

## 2015-12-06 LAB — MAGNESIUM: Magnesium: 2 mg/dL (ref 1.7–2.4)

## 2015-12-06 LAB — GLUCOSE, CAPILLARY
Glucose-Capillary: 104 mg/dL — ABNORMAL HIGH (ref 65–99)
Glucose-Capillary: 124 mg/dL — ABNORMAL HIGH (ref 65–99)
Glucose-Capillary: 126 mg/dL — ABNORMAL HIGH (ref 65–99)

## 2015-12-06 LAB — PREALBUMIN: Prealbumin: 8.9 mg/dL — ABNORMAL LOW (ref 18–38)

## 2015-12-06 LAB — TRIGLYCERIDES: Triglycerides: 102 mg/dL (ref <150)

## 2015-12-06 MED ORDER — FAT EMULSION 20 % IV EMUL
240.0000 mL | INTRAVENOUS | Status: AC
  Administered 2015-12-06: 240 mL via INTRAVENOUS
  Filled 2015-12-06: qty 250

## 2015-12-06 MED ORDER — POTASSIUM CHLORIDE 10 MEQ/100ML IV SOLN
10.0000 meq | INTRAVENOUS | Status: AC
  Administered 2015-12-06 (×4): 10 meq via INTRAVENOUS
  Filled 2015-12-06 (×4): qty 100

## 2015-12-06 MED ORDER — TRACE MINERALS CR-CU-MN-SE-ZN 10-1000-500-60 MCG/ML IV SOLN
INTRAVENOUS | Status: AC
  Administered 2015-12-06: 17:00:00 via INTRAVENOUS
  Filled 2015-12-06: qty 1440

## 2015-12-06 MED ORDER — KCL IN DEXTROSE-NACL 20-5-0.9 MEQ/L-%-% IV SOLN
INTRAVENOUS | Status: DC
  Administered 2015-12-07: 12:00:00 via INTRAVENOUS
  Filled 2015-12-06: qty 1000

## 2015-12-06 NOTE — Progress Notes (Signed)
[facility] PROGRESS NOTE   Nathan Shaw [id number] DOB: 11/28/1955 DOA: 11/30/2015 PCP: Gennette Pac, MD  HPI/Subjective: Denies any new complaints, continued to have small mucousy bowel movements. CT scan showed enlarging mesenteric abscess  Assessment/Plan: Principal Problem:   Ileal perforation from Crohn's ileitis Active Problems:   Crohn's ileitis (HCC)   GERD (gastroesophageal reflux disease)   Thrombocytopenia (HCC)   Small bowel perforation with contained phlegmon/abscess Likely secondary to terminal ileal Crohn's disease with contained abscess/perforation. CT suggested extensive involvement of the terminal ileum with the Crohn's disease. General surgery consulted, recommended conservative management for now. Wasn't Zosyn, switched to Cipro/Flagyl by general surgery, continue continue current regimen Repeat CT scanShowed enlarging mesenteric abscess. General surgery switch antibiotics back to Zosyn, place and TPN. GEN surgery to discuss with IR to see if the abscess is amenable to percutaneous drainage.  Crohn's ileitis Patient is not on any medication at home, gastroenterology consulted. On antibiotics, improving clinically, continue to hold the steroids.  GERD Patient asking for Prilosec placed on Protonix.  Thrombocytopenia Unclear etiology, cannot rule out infection as cause. This is resolved   Code Status: Full Code Family Communication: Plan discussed with the patient. Disposition Plan: Remains inpatient Diet: Diet NPO time specified Except for: Ice Chips, Sips with Meds TPN (CLINIMIX-E) Adult TPN (CLINIMIX-E) Adult  Consultants:  GI  Gen. surgery  Procedures:  None  Antibiotics:  Zosyn---->3/1--->3/2  Cipro/[place]-----3/2   Objective: Filed Vitals:   12/05/15 2135 12/06/15 0631  BP: 118/73 114/72  Pulse: 79 72  Temp: 98.3 F (36.8 C) 98.9 F (37.2 C)  Resp: 18 18    Intake/Output Summary (Last 24 hours) at  12/06/15 1202 Last data filed at 12/06/15 1000  Gross per 24 hour  Intake   1961 ml  Output   2800 ml  Net   -839 ml   Filed Weights   11/30/15 2304  Weight: 83.462 kg (184 lb)    Exam: General: Alert and awake, oriented x3, not in any acute distress. HEENT: anicteric sclera, pupils reactive to light and accommodation, EOMI CVS: S1-S2 clear, no murmur rubs or gallops Chest: clear to auscultation bilaterally, no wheezing, rales or rhonchi Abdomen: soft nontender, nondistended, normal bowel sounds, no organomegaly Extremities: no cyanosis, clubbing or edema noted bilaterally Neuro: Cranial nerves II-XII intact, no focal neurological deficits  Data Reviewed: Basic Metabolic Panel:  Recent Labs Lab 11/30/15 1739 12/01/15 0434 12/04/15 0515 12/05/15 0425 12/06/15 0520  NA 141 142 140 138 143  K 3.5 3.5 3.7 3.3* 3.3*  CL 104 110 110 109 110  CO2 28 26 22  21* 26  GLUCOSE 108* 112* 105* 95 145*  BUN 17 13 6  <5* <5*  CREATININE 1.08 1.07 0.76 0.77 0.76  CALCIUM 8.6* 7.7* 8.1* 8.0* 8.4*  MG  --   --   --   --  2.0  PHOS  --   --   --   --  2.8   Liver Function Tests:  Recent Labs Lab 12/01/15 0434 12/06/15 0520  AST 25 17  ALT 30 20  ALKPHOS 58 60  BILITOT 1.3* 0.6  PROT 4.9* 5.3*  ALBUMIN 2.3* 2.4*   No results for input(s): LIPASE, AMYLASE in the last 168 hours. No results for input(s): AMMONIA in the last 168 hours. CBC:  Recent Labs Lab 11/30/15 1739 12/01/15 0434 12/04/15 0515 12/06/15 0810  WBC 8.5 5.5 7.8 10.2  NEUTROABS 7.0 4.4  --  8.2*  HGB 16.3 13.8 14.2 14.4  HCT  48.9 42.3 41.4 42.8  MCV 88.4 90.2 85.7 86.6  PLT 87* 78* 138* 239   Cardiac Enzymes: No results for input(s): CKTOTAL, CKMB, CKMBINDEX, TROPONINI in the last 168 hours. BNP (last 3 results) No results for input(s): BNP in the last 8760 hours.  ProBNP (last 3 results) No results for input(s): PROBNP in the last 8760 hours.  CBG:  Recent Labs Lab 12/02/15 2348  12/03/15 0557 12/05/15 1606 12/06/15 0016 12/06/15 0809  GLUCAP 90 90 75 104* 126*    Micro No results found for this or any previous visit (from the past 240 hour(s)).   Studies: Ct Abdomen Pelvis W Contrast  12/05/2015  CLINICAL DATA:  Abscess, Crohn disease. EXAM: CT ABDOMEN AND PELVIS WITH CONTRAST TECHNIQUE: Multidetector CT imaging of the abdomen and pelvis was performed using the standard protocol following bolus administration of intravenous contrast. CONTRAST:  172mL OMNIPAQUE IOHEXOL 300 MG/ML  SOLN COMPARISON:  11/30/2015. FINDINGS: Lower chest: Lung bases show no acute findings. Heart size normal. No pericardial or pleural effusion. Hepatobiliary: 4 mm low-attenuation lesion in the dome of the liver is too small to characterize. Liver and gallbladder are otherwise unremarkable. No biliary ductal dilatation. Pancreas: Negative. Spleen: Negative. Adrenals/Urinary Tract: Adrenal glands and right kidney are unremarkable. Sub cm low-attenuation lesion in the left kidney is too small to characterize but statistically, likely represents a cyst. Ureters are decompressed. Bladder is unremarkable. Stomach/Bowel: Stomach is unremarkable. Scattered areas of suspected small bowel narrowing, as before. There is marked thickening of the distal/terminal ileum with hypervascular surrounding mesentery. Extraluminal collection of air, contrast and marked haziness in the ileocolic mesentery measures roughly 4.2 x 8.3 x 7.6 cm (series 2, image 59 and coronal image 40). The opacified collection of fluid and air measures 1.8 cm (2/66). An opacified tract is seen extending from the distal ileum, best imaged on coronal image 37. Findings are progressive from 11/30/2015. Colon is unremarkable. Vascular/Lymphatic: Atherosclerotic calcification of the arterial vasculature without abdominal aortic aneurysm. Lymph nodes in the ileocolic mesentery measure up to 9 mm, similar. Otherwise, no pathologically enlarged lymph  nodes. Reproductive: Prostate is normal in size. Other: Small pelvic free fluid. Mesenteries and peritoneum are otherwise unremarkable. Musculoskeletal: No worrisome lytic or sclerotic lesions. IMPRESSION: 1. Progressive changes of active Crohn disease involving the distal/terminal ileum with a persistent leak/ rupture and enlarging mesenteric abscess. 2. Small pelvic free fluid. Electronically Signed   By: Lorin Picket M.D.   On: 12/05/2015 10:49    Scheduled Meds: . fluticasone  1 spray Each Nare Daily  . insulin aspart  0-9 Units Subcutaneous Q8H  . lip balm  1 application Topical BID  . pantoprazole (PROTONIX) IV  40 mg Intravenous QHS  . piperacillin-tazobactam (ZOSYN)  IV  3.375 g Intravenous 3 times per day  . potassium chloride  10 mEq Intravenous Q1 Hr x 4   Continuous Infusions: . dextrose 5 % and 0.9 % NaCl with KCl 20 mEq/L    . dextrose 5 % and 0.9% NaCl 1,000 mL with potassium chloride 20 mEq infusion 60 mL/hr at 12/05/15 1714  . Marland KitchenTPN (CLINIMIX-E) Adult 40 mL/hr at 12/05/15 1754   And  . fat emulsion 240 mL (12/05/15 1754)  . Marland KitchenTPN (CLINIMIX-E) Adult     And  . fat emulsion         Time spent: 35 minutes    [facility] A  [facility] Pager [phone] If 7PM-7AM, please contact night-coverage at [email or web address], password [facility] 12/06/2015, 12:02 PM  LOS: 6 days

## 2015-12-06 NOTE — Progress Notes (Signed)
Patient ID: Nathan Shaw, male   DOB: 1956/01/21, 60 y.o.   MRN: [id number] [facility] Surgery Progress Note     Subjective: Pt notes no pain, N/V. Having BMs, +flatus. Ambulating well. Concerned about changes of abx and whether abscess can be drained.  Objective: Vital signs in last 24 hours: Temp:  [98.3 F (36.8 C)-98.9 F (37.2 C)] 98.9 F (37.2 C) (03/07 0631) Pulse Rate:  [72-90] 72 (03/07 0631) Resp:  [18] 18 (03/07 0631) BP: (114-120)/(72-74) 114/72 mmHg (03/07 0631) SpO2:  [100 %] 100 % (03/07 0631) Last BM Date: 12/05/15  Intake/Output from previous day: 03/06 0701 - 03/07 0700 In: 1721 [P.O.:240; I.V.:776; IV Piggyback:100; TPN:605] Out: 3100 [Urine:3100] Intake/Output this shift:    PE: Gen:  Alert, NAD, pleasant Card: RRR, no m/r/g noted Pulm: CTAB Abd: Soft, mild distension, sl tender to deep palpation, +BS, no HSM, no scars noted Ext: No edema appreciated, +pedal pulses bilaterally   Lab Results:   Recent Labs  12/04/15 0515 12/06/15 0810  WBC 7.8 10.2  HGB 14.2 14.4  HCT 41.4 42.8  PLT 138* 239   BMET  Recent Labs  12/05/15 0425 12/06/15 0520  NA 138 143  K 3.3* 3.3*  CL 109 110  CO2 21* 26  GLUCOSE 95 145*  BUN <5* <5*  CREATININE 0.77 0.76  CALCIUM 8.0* 8.4*   PT/INR No results for input(s): LABPROT, INR in the last 72 hours. CMP     Component Value Date/Time   NA 143 12/06/2015 0520   K 3.3* 12/06/2015 0520   CL 110 12/06/2015 0520   CO2 26 12/06/2015 0520   GLUCOSE 145* 12/06/2015 0520   BUN <5* 12/06/2015 0520   CREATININE 0.76 12/06/2015 0520   CALCIUM 8.4* 12/06/2015 0520   PROT 5.3* 12/06/2015 0520   ALBUMIN 2.4* 12/06/2015 0520   AST 17 12/06/2015 0520   ALT 20 12/06/2015 0520   ALKPHOS 60 12/06/2015 0520   BILITOT 0.6 12/06/2015 0520   GFRNONAA >60 12/06/2015 0520   GFRAA >60 12/06/2015 0520   Lipase  No results found for: LIPASE     Studies/Results: Ct Abdomen Pelvis W Contrast  12/05/2015   CLINICAL DATA:  Abscess, Crohn disease. EXAM: CT ABDOMEN AND PELVIS WITH CONTRAST TECHNIQUE: Multidetector CT imaging of the abdomen and pelvis was performed using the standard protocol following bolus administration of intravenous contrast. CONTRAST:  146mL OMNIPAQUE IOHEXOL 300 MG/ML  SOLN COMPARISON:  11/30/2015. FINDINGS: Lower chest: Lung bases show no acute findings. Heart size normal. No pericardial or pleural effusion. Hepatobiliary: 4 mm low-attenuation lesion in the dome of the liver is too small to characterize. Liver and gallbladder are otherwise unremarkable. No biliary ductal dilatation. Pancreas: Negative. Spleen: Negative. Adrenals/Urinary Tract: Adrenal glands and right kidney are unremarkable. Sub cm low-attenuation lesion in the left kidney is too small to characterize but statistically, likely represents a cyst. Ureters are decompressed. Bladder is unremarkable. Stomach/Bowel: Stomach is unremarkable. Scattered areas of suspected small bowel narrowing, as before. There is marked thickening of the distal/terminal ileum with hypervascular surrounding mesentery. Extraluminal collection of air, contrast and marked haziness in the ileocolic mesentery measures roughly 4.2 x 8.3 x 7.6 cm (series 2, image 59 and coronal image 40). The opacified collection of fluid and air measures 1.8 cm (2/66). An opacified tract is seen extending from the distal ileum, best imaged on coronal image 37. Findings are progressive from 11/30/2015. Colon is unremarkable. Vascular/Lymphatic: Atherosclerotic calcification of the arterial vasculature without abdominal aortic aneurysm.  Lymph nodes in the ileocolic mesentery measure up to 9 mm, similar. Otherwise, no pathologically enlarged lymph nodes. Reproductive: Prostate is normal in size. Other: Small pelvic free fluid. Mesenteries and peritoneum are otherwise unremarkable. Musculoskeletal: No worrisome lytic or sclerotic lesions. IMPRESSION: 1. Progressive changes of  active Crohn disease involving the distal/terminal ileum with a persistent leak/ rupture and enlarging mesenteric abscess. 2. Small pelvic free fluid. Electronically Signed   By: Lorin Picket M.D.   On: 12/05/2015 10:49    Anti-infectives: Anti-infectives    Start     Dose/Rate Route Frequency Ordered Stop   12/06/15 0600  piperacillin-tazobactam (ZOSYN) IVPB 3.375 g     3.375 g 12.5 mL/hr over 240 Minutes Intravenous 3 times per day 12/05/15 2208     12/05/15 2215  piperacillin-tazobactam (ZOSYN) IVPB 3.375 g     3.375 g 12.5 mL/hr over 240 Minutes Intravenous NOW 12/05/15 2208 12/06/15 0324   12/01/15 1800  ciprofloxacin (CIPRO) IVPB 400 mg  Status:  Discontinued     400 mg 200 mL/hr over 60 Minutes Intravenous Every 12 hours 12/01/15 1657 12/05/15 1217   12/01/15 1800  metroNIDAZOLE (FLAGYL) IVPB 500 mg  Status:  Discontinued     500 mg 100 mL/hr over 60 Minutes Intravenous Every 6 hours 12/01/15 1657 12/05/15 1217   12/01/15 0600  piperacillin-tazobactam (ZOSYN) IVPB 3.375 g  Status:  Discontinued     3.375 g 12.5 mL/hr over 240 Minutes Intravenous 3 times per day 12/01/15 0459 12/01/15 1701   11/30/15 2030  piperacillin-tazobactam (ZOSYN) IVPB 3.375 g     3.375 g 100 mL/hr over 30 Minutes Intravenous  Once 11/30/15 2019 11/30/15 2113   11/30/15 2015  piperacillin-tazobactam (ZOSYN) IVPB 3.375 g  Status:  Discontinued     3.375 g 12.5 mL/hr over 240 Minutes Intravenous  Once 11/30/15 2003 11/30/15 2019       Assessment/Plan HD #7 Crohn's disease Distal ileal perforation with abscess (4.x x 8.3 x 7.6cm) -Continue medical management of abscess.   -Continue TPN through PICC -IV antibiotics - Zosyn Day #2, was previously on invanz.   -GI advises holding off on treatment with steroids for now given infectious issues.   -No plans for surgical management/intervention at this time. Repeat CT (3/6) shows progressive changes of active Crohn's disease involving distal and terminal  ileum with persistent leak/rupture and enlarging phlegmon.  Actual fluid collection slightly smaller.   Abscess not amenable to perc drainage.    Will treat patient.  Will allow sips.  If very slow advance of diet exacerbates symptoms, will likely need operative management.  LOS: 6 days    Stark Klein, MD   12/06/2015, 10:30 AM

## 2015-12-06 NOTE — Progress Notes (Signed)
CT scan reviewed. I would favor IR drainage if felt feasible and therapeutic prior to startng steroids

## 2015-12-06 NOTE — Progress Notes (Signed)
Nutrition Follow-up  INTERVENTION:   Monitor magnesium, potassium, and phosphorus daily for at least 3 days, MD to replete as needed, as pt is at risk for refeeding syndrome given NPO status/Poor PO intake x 9 days and low K levels.  TPN per Pharmacy Diet advancement per surgery RD to continue to monitor  NUTRITION DIAGNOSIS:   Inadequate oral intake related to nausea, vomiting as evidenced by per patient/family report.  Ongoing.  GOAL:   Patient will meet greater than or equal to 90% of their needs  Progressing  MONITOR:   Diet advancement, Labs, Weight trends, I & O's, Other (Comment) (TPN)  REASON FOR ASSESSMENT:   Consult New TPN/TNA  ASSESSMENT:   60 y/o white male with PMH Crohn's disease in remission last 2 years and off medications presents to [facility] because of worsening abdominal pain. Patient's symptoms started Monday with N/V/D, fever/chills. He thought he had the flu. On 11/30/15 the patient started having 5/10 intermittent aching abdominal pain which was initially periumbilical but migrated to the lower quadrants. Last BM was yesterday.   Patient reports tolerating TPN with no issue so far, currently infusing at 40 ml/hr and lipids at 10 ml/hr. Pt has been NPO for 9 days now, may need to monitor Mg/Phos/K for refeeding. Pt with no questions at this time. RD will continue to monitor.  Plan per Pharmacy 3/7: at 1800 today:  increase Clinimix E 5/15 to 60 ml/hr.  20% fat emulsion at 10 ml/hr.  Plan to advance as tolerated to the goal rate.  Goal: Clinimix E 5/15 at a goal rate of 83 ml/hr + 20% fat emulsion at 10 ml/hr to provide: 100 g/day protein, 1894 Kcal/day.  Medications: D5 NS with 20 mEq/L at 60 ml/hr -provides 245 kcal  Labs reviewed: CBGs: 74-126 Low K Mg/Phos WNL  Diet Order:  Diet NPO time specified Except for: Ice Chips, Sips with Meds TPN (CLINIMIX-E) Adult TPN (CLINIMIX-E) Adult  Skin:  Reviewed, no issues  Last BM:   3/6  Height:   Ht Readings from Last 1 Encounters:  11/30/15 5\' 11"  (1.803 m)    Weight:   Wt Readings from Last 1 Encounters:  11/30/15 184 lb (83.462 kg)    Ideal Body Weight:  78.2 kg  BMI:  Body mass index is 25.67 kg/(m^2).  Estimated Nutritional Needs:   Kcal:  1900-2100  Protein:  95-105g  Fluid:  2L/day  EDUCATION NEEDS:   No education needs identified at this time  Clayton Bibles, MS, RD, LDN Pager: [phone] After Hours Pager: [phone]

## 2015-12-06 NOTE — Progress Notes (Signed)
[place] NOTE   Pharmacy Consult for TPN Indication: crohns with ileal perforation and abscess currently NPO  No Known Allergies  Patient Measurements: Height: 5\' 11"  (180.3 cm) Weight: 184 lb (83.462 kg) IBW/kg (Calculated) : 75.3 Adjusted Body Weight:  Usual Weight:   Vital Signs: Temp: 98.9 F (37.2 C) (03/07 0631) Temp Source: Oral (03/07 0631) BP: 114/72 mmHg (03/07 0631) Pulse Rate: 72 (03/07 0631) Intake/Output from previous day: 03/06 0701 - 03/07 0700 In: 1721 [P.O.:240; I.V.:776; IV Piggyback:100; TPN:605] Out: 3100 [Urine:3100] Intake/Output from this shift:    Labs:  Recent Labs  12/04/15 0515  WBC 7.8  HGB 14.2  HCT 41.4  PLT 138*     Recent Labs  12/04/15 0515 12/05/15 0425 12/06/15 0520  NA 140 138 143  K 3.7 3.3* 3.3*  CL 110 109 110  CO2 22 21* 26  GLUCOSE 105* 95 145*  BUN 6 <5* <5*  CREATININE 0.76 0.77 0.76  CALCIUM 8.1* 8.0* 8.4*  MG  --   --  2.0  PHOS  --   --  2.8  PROT  --   --  5.3*  ALBUMIN  --   --  2.4*  AST  --   --  17  ALT  --   --  20  ALKPHOS  --   --  60  BILITOT  --   --  0.6   Estimated Creatinine Clearance: 108.5 mL/min (by C-G formula based on Cr of 0.76).    Recent Labs  12/05/15 1606 12/06/15 0016 12/06/15 0809  GLUCAP 75 104* 126*    Medical History: Past Medical History  Diagnosis Date  . Crohn disease ([place]) last flare up dec 2014/ jan 2015  . Wears contact lenses     Insulin Requirements: none - no hx DM  Current Nutrition: NPO  IVF: D5 NS with 20 mEq/L at 60 ml/hr  Central access: PICC placement 3/6 TPN start date: 3/6  ASSESSMENT                                                                                                          HPI: Patient is a 60 y.o M with hx Crohn's disease (off medications for 2 years) presented to the ED on 3/1 with c/o abdominal pain, n/v, and diarrhea.  Abdominal CT showed Crohn's disease with a perforation and contained abscess in  the distal ileum. No surgical intervention at this time per CCS.  To start TPN while patient is NPO  Significant events:  3/6 abd CT: Progressive changes of active Crohn disease involving the distal/terminal ileum with a persistent leak/ rupture and enlarging mesenteric abscess.  Today:   Glucose (goal <150): 145  Electrolytes: K 3.3 , CCa 9.7, all others WNL  Renal: scr wnl  LFTs: WNL   TGs: 102  Prealbumin: pending  NUTRITIONAL GOALS  RD recs (on 3/2):  Kcal: 1900-2100 Protein: 95-105g Fluid: 2L/day  Clinimix E 5/15 at a goal rate of 83 ml/hr + 20% fat emulsion at 10 ml/hr to provide: 100 g/day protein, 1894 Kcal/day.  PLAN                                                                                                    KCL 31meq IV x 4                     at 1800 today:  increase Clinimix E 5/15 to 60 ml/hr.  20% fat emulsion at 10 ml/hr.  Plan to advance as tolerated to the goal rate.  TPN to contain standard multivitamins and trace elements.  Reduce IVF to 40 ml/hr.  continue sensitive SSI q8h   Bmet with mag and phos in am  TPN lab panels on Mondays & Thursdays.  F/u daily.   Dolly Rias RPh 12/06/2015, 8:36 AM Pager [phone]

## 2015-12-07 DIAGNOSIS — K631 Perforation of intestine (nontraumatic): Secondary | ICD-10-CM

## 2015-12-07 DIAGNOSIS — K50014 Crohn's disease of small intestine with abscess: Secondary | ICD-10-CM

## 2015-12-07 DIAGNOSIS — D696 Thrombocytopenia, unspecified: Secondary | ICD-10-CM

## 2015-12-07 LAB — BASIC METABOLIC PANEL
Anion gap: 6 (ref 5–15)
BUN: <5 mg/dL — ABNORMAL LOW (ref 6–20)
CO2: 25 mmol/L (ref 22–32)
Calcium: 8.1 mg/dL — ABNORMAL LOW (ref 8.9–10.3)
Chloride: 109 mmol/L (ref 101–111)
Creatinine, Ser: 0.8 mg/dL (ref 0.61–1.24)
GFR calc Af Amer: >60 mL/min (ref >60)
GFR calc non Af Amer: >60 mL/min (ref >60)
Glucose, Bld: 144 mg/dL — ABNORMAL HIGH (ref 65–99)
Potassium: 3.6 mmol/L (ref 3.5–5.1)
Sodium: 140 mmol/L (ref 135–145)

## 2015-12-07 LAB — GLUCOSE, CAPILLARY
Glucose-Capillary: 110 mg/dL — ABNORMAL HIGH (ref 65–99)
Glucose-Capillary: 115 mg/dL — ABNORMAL HIGH (ref 65–99)
Glucose-Capillary: 118 mg/dL — ABNORMAL HIGH (ref 65–99)
Glucose-Capillary: 122 mg/dL — ABNORMAL HIGH (ref 65–99)

## 2015-12-07 LAB — PHOSPHORUS: Phosphorus: 3 mg/dL (ref 2.5–4.6)

## 2015-12-07 LAB — MAGNESIUM: Magnesium: 2.1 mg/dL (ref 1.7–2.4)

## 2015-12-07 MED ORDER — FAT EMULSION 20 % IV EMUL
240.0000 mL | INTRAVENOUS | Status: AC
  Administered 2015-12-07: 240 mL via INTRAVENOUS
  Filled 2015-12-07: qty 240

## 2015-12-07 MED ORDER — POTASSIUM CHLORIDE 10 MEQ/100ML IV SOLN
10.0000 meq | INTRAVENOUS | Status: AC
Start: 2015-12-07 — End: 2015-12-07
  Administered 2015-12-07 (×2): 10 meq via INTRAVENOUS
  Filled 2015-12-07 (×2): qty 100

## 2015-12-07 MED ORDER — METRONIDAZOLE IN NACL 5-0.79 MG/ML-% IV SOLN
500.0000 mg | Freq: Three times a day (TID) | INTRAVENOUS | Status: DC
  Administered 2015-12-07 – 2015-12-10 (×9): 500 mg via INTRAVENOUS
  Filled 2015-12-07 (×10): qty 100

## 2015-12-07 MED ORDER — ENOXAPARIN SODIUM 40 MG/0.4ML ~~LOC~~ SOLN
40.0000 mg | SUBCUTANEOUS | Status: DC
  Administered 2015-12-07 – 2015-12-11 (×5): 40 mg via SUBCUTANEOUS
  Filled 2015-12-07 (×6): qty 0.4

## 2015-12-07 MED ORDER — TRACE MINERALS CR-CU-MN-SE-ZN 10-1000-500-60 MCG/ML IV SOLN
INTRAVENOUS | Status: AC
  Administered 2015-12-07: 18:00:00 via INTRAVENOUS
  Filled 2015-12-07: qty 1992

## 2015-12-07 MED ORDER — KCL IN DEXTROSE-NACL 20-5-0.9 MEQ/L-%-% IV SOLN
INTRAVENOUS | Status: DC
  Filled 2015-12-07: qty 1000

## 2015-12-07 NOTE — Progress Notes (Signed)
[facility] PROGRESS NOTE   Nathan Shaw [id number] DOB: 1956-07-13 DOA: 11/30/2015 PCP: Gennette Pac, MD  HPI/Subjective: Feels well, no pain  Assessment/Plan:  Small bowel perforation with contained phlegmon/abscess -secondary to terminal ileal Crohn's disease with contained abscess/perforation. -CT suggested extensive involvement of the terminal ileum with the Crohn's disease. -General surgery consulted, recommended conservative management for now. -Was on Zosyn, switched to Cipro/Flagyl, now back on IV Zosyn again -Repeat CT scan showed enlarging mesenteric abscess. -now back on TPN, IV Zosyn -per CCS, Gi following too, not appropriate for IV steroids at this time   Crohn's ileitis -not on any medications at home, gastroenterology consulted. On antibiotics, improving clinically, continue to hold the steroids.  GERD -PPI  Thrombocytopenia -due to sepsis, resolved  dvt proph: add lovenox  Code Status: Full Code Family Communication: Plan discussed with the patient. Disposition Plan: Remains inpatient  Consultants:  GI  Gen. surgery  Procedures:  None  Antibiotics:  Zosyn---->3/1--->3/2  Cipro/[place]-----3/2   Objective: Filed Vitals:   12/06/15 2135 12/07/15 0532  BP: 129/73 121/68  Pulse: 73 75  Temp: 98.6 F (37 C) 98 F (36.7 C)  Resp: 18 18    Intake/Output Summary (Last 24 hours) at 12/07/15 1359 Last data filed at 12/07/15 1000  Gross per 24 hour  Intake 2666.51 ml  Output   2825 ml  Net -158.49 ml   Filed Weights   11/30/15 2304  Weight: 83.462 kg (184 lb)    Exam: General: Alert and awake, oriented x3, not in any acute distress. HEENT: anicteric sclera, pupils reactive to light and accommodation, EOMI CVS: S1-S2 clear, no murmur rubs or gallops Chest: clear to auscultation bilaterally, no wheezing, rales or rhonchi Abdomen: soft nontender, nondistended, normal bowel sounds, no organomegaly Extremities: no  cyanosis, clubbing or edema noted bilaterally Neuro: Cranial nerves II-XII intact, no focal neurological deficits  Data Reviewed: Basic Metabolic Panel:  Recent Labs Lab 12/01/15 0434 12/04/15 0515 12/05/15 0425 12/06/15 0520 12/07/15 0518  NA 142 140 138 143 140  K 3.5 3.7 3.3* 3.3* 3.6  CL 110 110 109 110 109  CO2 26 22 21* 26 25  GLUCOSE 112* 105* 95 145* 144*  BUN 13 6 <5* <5* <5*  CREATININE 1.07 0.76 0.77 0.76 0.80  CALCIUM 7.7* 8.1* 8.0* 8.4* 8.1*  MG  --   --   --  2.0 2.1  PHOS  --   --   --  2.8 3.0   Liver Function Tests:  Recent Labs Lab 12/01/15 0434 12/06/15 0520  AST 25 17  ALT 30 20  ALKPHOS 58 60  BILITOT 1.3* 0.6  PROT 4.9* 5.3*  ALBUMIN 2.3* 2.4*   No results for input(s): LIPASE, AMYLASE in the last 168 hours. No results for input(s): AMMONIA in the last 168 hours. CBC:  Recent Labs Lab 11/30/15 1739 12/01/15 0434 12/04/15 0515 12/06/15 0810  WBC 8.5 5.5 7.8 10.2  NEUTROABS 7.0 4.4  --  8.2*  HGB 16.3 13.8 14.2 14.4  HCT 48.9 42.3 41.4 42.8  MCV 88.4 90.2 85.7 86.6  PLT 87* 78* 138* 239   Cardiac Enzymes: No results for input(s): CKTOTAL, CKMB, CKMBINDEX, TROPONINI in the last 168 hours. BNP (last 3 results) No results for input(s): BNP in the last 8760 hours.  ProBNP (last 3 results) No results for input(s): PROBNP in the last 8760 hours.  CBG:  Recent Labs Lab 12/06/15 0016 12/06/15 0809 12/06/15 1604 12/07/15 0003 12/07/15 0836  GLUCAP 104* 126* 124*  122* 115*    Micro No results found for this or any previous visit (from the past 240 hour(s)).   Studies: No results found.  Scheduled Meds: . fluticasone  1 spray Each Nare Daily  . insulin aspart  0-9 Units Subcutaneous Q8H  . lip balm  1 application Topical BID  . metronidazole  500 mg Intravenous Q8H  . pantoprazole (PROTONIX) IV  40 mg Intravenous QHS  . piperacillin-tazobactam (ZOSYN)  IV  3.375 g Intravenous 3 times per day   Continuous Infusions: .  Marland KitchenTPN (CLINIMIX-E) Adult     And  . fat emulsion    . dextrose 5 % and 0.9 % NaCl with KCl 20 mEq/L 40 mL/hr at 12/07/15 1130  . dextrose 5 % and 0.9 % NaCl with KCl 20 mEq/L    . Marland KitchenTPN (CLINIMIX-E) Adult 60 mL/hr at 12/06/15 1716   And  . fat emulsion 240 mL (12/06/15 1717)       Time spent: 35 minutes    Millissa Deese  [facility] Pager [phone] If 7PM-7AM, please contact night-coverage at [email or web address], password [facility] 12/07/2015, 1:59 PM  LOS: 7 days

## 2015-12-07 NOTE — Progress Notes (Signed)
Subjective: Pt tolerating PO intake of water w/o nausea. Had loose, dark green BM this AM, and notes minimal flatus. Pt notes pruritis of hands which he notes has happened previously and treated with Benedryl. Pt believes this may be SE of TPN.  Objective: Vital signs in last 24 hours: Temp:  [97.8 F (36.6 C)-98.6 F (37 C)] 98 F (36.7 C) (03/08 0532) Pulse Rate:  [73-80] 75 (03/08 0532) Resp:  [18] 18 (03/08 0532) BP: (121-129)/(68-76) 121/68 mmHg (03/08 0532) SpO2:  [100 %] 100 % (03/08 0532) Last BM Date: 12/06/15  Intake/Output from previous day: 03/07 0701 - 03/08 0700 In: 2846.5 [P.O.:120; I.V.:1222; IV Piggyback:50; TPN:1454.5] Out: 2875 [Urine:2875] Intake/Output this shift:    Gen: pleasant, WDWN white male in NAD Skin: erythema on hands, dry, erythematous patch near left epigastric region Abd: soft, non distended, non tender to light and deep palpation, +BS  Lab Results:   Recent Labs  12/06/15 0810  WBC 10.2  HGB 14.4  HCT 42.8  PLT 239   BMET  Recent Labs  12/06/15 0520 12/07/15 0518  NA 143 140  K 3.3* 3.6  CL 110 109  CO2 26 25  GLUCOSE 145* 144*  BUN <5* <5*  CREATININE 0.76 0.80  CALCIUM 8.4* 8.1*   PT/INR No results for input(s): LABPROT, INR in the last 72 hours. ABG No results for input(s): PHART, HCO3 in the last 72 hours.  Invalid input(s): PCO2, PO2  Studies/Results: Ct Abdomen Pelvis W Contrast  12/05/2015  CLINICAL DATA:  Abscess, Crohn disease. EXAM: CT ABDOMEN AND PELVIS WITH CONTRAST TECHNIQUE: Multidetector CT imaging of the abdomen and pelvis was performed using the standard protocol following bolus administration of intravenous contrast. CONTRAST:  148mL OMNIPAQUE IOHEXOL 300 MG/ML  SOLN COMPARISON:  11/30/2015. FINDINGS: Lower chest: Lung bases show no acute findings. Heart size normal. No pericardial or pleural effusion. Hepatobiliary: 4 mm low-attenuation lesion in the dome of the liver is too small to characterize.  Liver and gallbladder are otherwise unremarkable. No biliary ductal dilatation. Pancreas: Negative. Spleen: Negative. Adrenals/Urinary Tract: Adrenal glands and right kidney are unremarkable. Sub cm low-attenuation lesion in the left kidney is too small to characterize but statistically, likely represents a cyst. Ureters are decompressed. Bladder is unremarkable. Stomach/Bowel: Stomach is unremarkable. Scattered areas of suspected small bowel narrowing, as before. There is marked thickening of the distal/terminal ileum with hypervascular surrounding mesentery. Extraluminal collection of air, contrast and marked haziness in the ileocolic mesentery measures roughly 4.2 x 8.3 x 7.6 cm (series 2, image 59 and coronal image 40). The opacified collection of fluid and air measures 1.8 cm (2/66). An opacified tract is seen extending from the distal ileum, best imaged on coronal image 37. Findings are progressive from 11/30/2015. Colon is unremarkable. Vascular/Lymphatic: Atherosclerotic calcification of the arterial vasculature without abdominal aortic aneurysm. Lymph nodes in the ileocolic mesentery measure up to 9 mm, similar. Otherwise, no pathologically enlarged lymph nodes. Reproductive: Prostate is normal in size. Other: Small pelvic free fluid. Mesenteries and peritoneum are otherwise unremarkable. Musculoskeletal: No worrisome lytic or sclerotic lesions. IMPRESSION: 1. Progressive changes of active Crohn disease involving the distal/terminal ileum with a persistent leak/ rupture and enlarging mesenteric abscess. 2. Small pelvic free fluid. Electronically Signed   By: Lorin Picket M.D.   On: 12/05/2015 10:49    Anti-infectives: Anti-infectives    Start     Dose/Rate Route Frequency Ordered Stop   12/06/15 0600  piperacillin-tazobactam (ZOSYN) IVPB 3.375 g  3.375 g 12.5 mL/hr over 240 Minutes Intravenous 3 times per day 12/05/15 2208     12/05/15 2215  piperacillin-tazobactam (ZOSYN) IVPB 3.375 g      3.375 g 12.5 mL/hr over 240 Minutes Intravenous NOW 12/05/15 2208 12/06/15 0324   12/01/15 1800  ciprofloxacin (CIPRO) IVPB 400 mg  Status:  Discontinued     400 mg 200 mL/hr over 60 Minutes Intravenous Every 12 hours 12/01/15 1657 12/05/15 1217   12/01/15 1800  metroNIDAZOLE (FLAGYL) IVPB 500 mg  Status:  Discontinued     500 mg 100 mL/hr over 60 Minutes Intravenous Every 6 hours 12/01/15 1657 12/05/15 1217   12/01/15 0600  piperacillin-tazobactam (ZOSYN) IVPB 3.375 g  Status:  Discontinued     3.375 g 12.5 mL/hr over 240 Minutes Intravenous 3 times per day 12/01/15 0459 12/01/15 1701   11/30/15 2030  piperacillin-tazobactam (ZOSYN) IVPB 3.375 g     3.375 g 100 mL/hr over 30 Minutes Intravenous  Once 11/30/15 2019 11/30/15 2113   11/30/15 2015  piperacillin-tazobactam (ZOSYN) IVPB 3.375 g  Status:  Discontinued     3.375 g 12.5 mL/hr over 240 Minutes Intravenous  Once 11/30/15 2003 11/30/15 2019      Assessment/Plan: HD #8 Crohn's disease  Distal ileal perforation with abscess (4.x x 8.3 x 7.6cm)  -Continue medical management of abscess.  -Continue TPN through PICC, continue allowance of sips, follow possible symptomatology of advancement of diet -IV antibiotics - Zosyn Day #3 -GI advises holding off on treatment with steroids for now given infectious issues.  -No plans for surgical management/intervention at this time. Repeat CT (3/6) shows progressive changes of active Crohn's disease involving distal and terminal ileum with persistent leak/rupture and enlarging phlegmon. Actual fluid collection slightly smaller.  Abscess not amenable to perc drainage.  -Manage pruritis with Benedryl   LOS: 7 days    Delma Post, PA-S [facility] 12/07/2015

## 2015-12-07 NOTE — Progress Notes (Signed)
[place] NOTE   Pharmacy Consult for TPN Indication: crohns with ileal perforation and abscess currently NPO  No Known Allergies  Patient Measurements: Height: 5\' 11"  (180.3 cm) Weight: 184 lb (83.462 kg) IBW/kg (Calculated) : 75.3 Adjusted Body Weight:  Usual Weight:   Vital Signs: Temp: 98 F (36.7 C) (03/08 0532) Temp Source: Oral (03/08 0532) BP: 121/68 mmHg (03/08 0532) Pulse Rate: 75 (03/08 0532) Intake/Output from previous day: 03/07 0701 - 03/08 0700 In: 2846.5 [P.O.:120; I.V.:1222; IV Piggyback:50; TPN:1454.5] Out: 2875 [Urine:2875] Intake/Output from this shift:    Labs:  Recent Labs  12/06/15 0810  WBC 10.2  HGB 14.4  HCT 42.8  PLT 239     Recent Labs  12/05/15 0425 12/06/15 0520 12/07/15 0518  NA 138 143 140  K 3.3* 3.3* 3.6  CL 109 110 109  CO2 21* 26 25  GLUCOSE 95 145* 144*  BUN <5* <5* <5*  CREATININE 0.77 0.76 0.80  CALCIUM 8.0* 8.4* 8.1*  MG  --  2.0 2.1  PHOS  --  2.8 3.0  PROT  --  5.3*  --   ALBUMIN  --  2.4*  --   AST  --  17  --   ALT  --  20  --   ALKPHOS  --  60  --   BILITOT  --  0.6  --   PREALBUMIN  --  8.9*  --   TRIG  --  102  --    Estimated Creatinine Clearance: 108.5 mL/min (by C-G formula based on Cr of 0.8).    Recent Labs  12/06/15 0809 12/06/15 1604 12/07/15 0003  GLUCAP 126* 124* 122*    Medical History: Past Medical History  Diagnosis Date  . Crohn disease ([place]) last flare up dec 2014/ jan 2015  . Wears contact lenses     Insulin Requirements: on sensitive SSI q8h (3 units over the past 24 hours) - no hx DM  Current Nutrition: NPO  IVF: D5 NS with 20 mEq/L at 60 ml/hr  Central access: PICC placement 3/6 TPN start date: 3/6  ASSESSMENT                                                                                                          HPI: Patient is a 60 y.o M with hx Crohn's disease (off medications for 2 years) presented to the ED on 3/1 with c/o abdominal pain,  n/v, and diarrhea.  Abdominal CT showed Crohn's disease with a perforation and contained abscess in the distal ileum. No surgical intervention at this time per CCS.  To start TPN while patient is NPO  Significant events:  3/6 abd CT: Progressive changes of active Crohn disease involving the distal/terminal ileum with a persistent leak/ rupture and enlarging mesenteric abscess. 3/8: advancing to clear liquid  Today:   Glucose (goal <150): all values <150  Electrolytes: K up 3.6-- s/p 4 runs on 3/8; CoCa and other lytes wnl (per RD, patient is at risk for refeeding syndrome, will monitor lytes closely this  first week)  Renal: scr wnl  LFTs: WNL   TGs: 102 (3/7)  Prealbumin: 8.9 (3/7)  NUTRITIONAL GOALS                                                                                             RD recs (on 3/7):  Kcal: 1900-2100 Protein: 95-105g Fluid: 2L/day  Clinimix E 5/15 at a goal rate of 83 ml/hr + 20% fat emulsion at 10 ml/hr to provide: 100 g/day protein, 1894 Kcal/day.  PLAN                                                                                                   - potassium chloride 10 mEq IV x2 runs - At 1800 today:  increase Clinimix E 5/15 to goal rate 83 ml/hr.  20% fat emulsion at 10 ml/hr.  TPN to contain standard multivitamins and trace elements.  Reduce IVF to [facility]  continue sensitive SSI q8h   TPN lab panels on Mondays & Thursdays.  F/u daily.   Dia Sitter, PharmD, BCPS 12/07/2015 7:34 AM

## 2015-12-07 NOTE — Progress Notes (Signed)
[facility] Progress Note  Subjective: Patient currently off the floor, reportedly relatively symptom free. Objective: Vital signs in last 24 hours: Temp:  [97.8 F (36.6 C)-98.6 F (37 C)] 98 F (36.7 C) (03/08 0532) Pulse Rate:  [73-80] 75 (03/08 0532) Resp:  [18] 18 (03/08 0532) BP: (121-129)/(68-76) 121/68 mmHg (03/08 0532) SpO2:  [100 %] 100 % (03/08 0532) Weight change:    [id number]  Lab Results: Results for orders placed or performed during the hospital encounter of 11/30/15 (from the past 24 hour(s))  Glucose, capillary     Status: Abnormal   Collection Time: 12/06/15  4:04 PM  Result Value Ref Range   Glucose-Capillary 124 (H) 65 - 99 mg/dL  Glucose, capillary     Status: Abnormal   Collection Time: 12/07/15 12:03 AM  Result Value Ref Range   Glucose-Capillary 122 (H) 65 - 99 mg/dL  Basic metabolic panel     Status: Abnormal   Collection Time: 12/07/15  5:18 AM  Result Value Ref Range   Sodium 140 135 - 145 mmol/L   Potassium 3.6 3.5 - 5.1 mmol/L   Chloride 109 101 - 111 mmol/L   CO2 25 22 - 32 mmol/L   Glucose, Bld 144 (H) 65 - 99 mg/dL   BUN <5 (L) 6 - 20 mg/dL   Creatinine, Ser 0.80 0.61 - 1.24 mg/dL   Calcium 8.1 (L) 8.9 - 10.3 mg/dL   GFR calc non Af Amer >60 >60 mL/min   GFR calc Af Amer >60 >60 mL/min   Anion gap 6 5 - 15  Magnesium     Status: None   Collection Time: 12/07/15  5:18 AM  Result Value Ref Range   Magnesium 2.1 1.7 - 2.4 mg/dL  Phosphorus     Status: None   Collection Time: 12/07/15  5:18 AM  Result Value Ref Range   Phosphorus 3.0 2.5 - 4.6 mg/dL  Glucose, capillary     Status: Abnormal   Collection Time: 12/07/15  8:36 AM  Result Value Ref Range   Glucose-Capillary 115 (H) 65 - 99 mg/dL    Studies/Results: Ct Abdomen Pelvis W Contrast  12/05/2015  CLINICAL DATA:  Abscess, Crohn disease. EXAM: CT ABDOMEN AND PELVIS WITH CONTRAST TECHNIQUE: Multidetector CT imaging of the abdomen and pelvis was performed using the  standard protocol following bolus administration of intravenous contrast. CONTRAST:  170mL OMNIPAQUE IOHEXOL 300 MG/ML  SOLN COMPARISON:  11/30/2015. FINDINGS: Lower chest: Lung bases show no acute findings. Heart size normal. No pericardial or pleural effusion. Hepatobiliary: 4 mm low-attenuation lesion in the dome of the liver is too small to characterize. Liver and gallbladder are otherwise unremarkable. No biliary ductal dilatation. Pancreas: Negative. Spleen: Negative. Adrenals/Urinary Tract: Adrenal glands and right kidney are unremarkable. Sub cm low-attenuation lesion in the left kidney is too small to characterize but statistically, likely represents a cyst. Ureters are decompressed. Bladder is unremarkable. Stomach/Bowel: Stomach is unremarkable. Scattered areas of suspected small bowel narrowing, as before. There is marked thickening of the distal/terminal ileum with hypervascular surrounding mesentery. Extraluminal collection of air, contrast and marked haziness in the ileocolic mesentery measures roughly 4.2 x 8.3 x 7.6 cm (series 2, image 59 and coronal image 40). The opacified collection of fluid and air measures 1.8 cm (2/66). An opacified tract is seen extending from the distal ileum, best imaged on coronal image 37. Findings are progressive from 11/30/2015. Colon is unremarkable. Vascular/Lymphatic: Atherosclerotic calcification of the arterial vasculature without abdominal aortic aneurysm. Lymph nodes  in the ileocolic mesentery measure up to 9 mm, similar. Otherwise, no pathologically enlarged lymph nodes. Reproductive: Prostate is normal in size. Other: Small pelvic free fluid. Mesenteries and peritoneum are otherwise unremarkable. Musculoskeletal: No worrisome lytic or sclerotic lesions. IMPRESSION: 1. Progressive changes of active Crohn disease involving the distal/terminal ileum with a persistent leak/ rupture and enlarging mesenteric abscess. 2. Small pelvic free fluid. Electronically Signed    By: Lorin Picket M.D.   On: 12/05/2015 10:49      Assessment: Crohn's ileitis with enlarging abscess/rupture, felt to be amenable to percutaneous drainage.  Plan: I still would avoid corticosteroids in this situation. A biological agent  Would probably pose similar risks relative to benefit Continue Zosyn Continue TPN Will add Flagyl which  May add some efficacy  In Crohn's over and above  Specific anaerobic coverage Will follow clinically and will likely need an updated CT scan in a few days to help guide management decisions.    Trevante Tennell C 12/07/2015, 10:18 AM  Pager [phone] If no answer or after 5 PM call [phone]

## 2015-12-08 LAB — GLUCOSE, CAPILLARY
Glucose-Capillary: 104 mg/dL — ABNORMAL HIGH (ref 65–99)
Glucose-Capillary: 101 mg/dL — ABNORMAL HIGH (ref 65–99)
Glucose-Capillary: 103 mg/dL — ABNORMAL HIGH (ref 65–99)

## 2015-12-08 LAB — CBC
HCT: 42.9 % (ref 39.0–52.0)
Hemoglobin: 14.1 g/dL (ref 13.0–17.0)
MCH: 29.1 pg (ref 26.0–34.0)
MCHC: 32.9 g/dL (ref 30.0–36.0)
MCV: 88.5 fL (ref 78.0–100.0)
Platelets: 311 10*3/uL (ref 150–400)
RBC: 4.85 MIL/uL (ref 4.22–5.81)
RDW: 13.1 % (ref 11.5–15.5)
WBC: 8.2 10*3/uL (ref 4.0–10.5)

## 2015-12-08 LAB — MAGNESIUM: Magnesium: 2.3 mg/dL (ref 1.7–2.4)

## 2015-12-08 LAB — COMPREHENSIVE METABOLIC PANEL
ALT: 18 U/L (ref 17–63)
AST: 20 U/L (ref 15–41)
Albumin: 2.6 g/dL — ABNORMAL LOW (ref 3.5–5.0)
Alkaline Phosphatase: 78 U/L (ref 38–126)
Anion gap: 8 (ref 5–15)
BUN: 8 mg/dL (ref 6–20)
CO2: 25 mmol/L (ref 22–32)
Calcium: 8.5 mg/dL — ABNORMAL LOW (ref 8.9–10.3)
Chloride: 108 mmol/L (ref 101–111)
Creatinine, Ser: 0.84 mg/dL (ref 0.61–1.24)
GFR calc Af Amer: >60 mL/min (ref >60)
GFR calc non Af Amer: >60 mL/min (ref >60)
Glucose, Bld: 139 mg/dL — ABNORMAL HIGH (ref 65–99)
Potassium: 4.1 mmol/L (ref 3.5–5.1)
Sodium: 141 mmol/L (ref 135–145)
Total Bilirubin: 0.5 mg/dL (ref 0.3–1.2)
Total Protein: 5.8 g/dL — ABNORMAL LOW (ref 6.5–8.1)

## 2015-12-08 LAB — PHOSPHORUS: Phosphorus: 3.6 mg/dL (ref 2.5–4.6)

## 2015-12-08 MED ORDER — DIPHENHYDRAMINE-ZINC ACETATE 2-0.1 % EX CREA
TOPICAL_CREAM | Freq: Two times a day (BID) | CUTANEOUS | Status: DC | PRN
  Administered 2015-12-08: 22:00:00 via TOPICAL
  Filled 2015-12-08: qty 28

## 2015-12-08 MED ORDER — FAT EMULSION 20 % IV EMUL
240.0000 mL | INTRAVENOUS | Status: AC
  Administered 2015-12-08: 240 mL via INTRAVENOUS
  Filled 2015-12-08: qty 250

## 2015-12-08 MED ORDER — TRACE MINERALS CR-CU-MN-SE-ZN 10-1000-500-60 MCG/ML IV SOLN
INTRAVENOUS | Status: AC
  Administered 2015-12-08: 18:00:00 via INTRAVENOUS
  Filled 2015-12-08: qty 1992

## 2015-12-08 NOTE — Progress Notes (Signed)
[facility] PROGRESS NOTE   Nathan Shaw [id number] DOB: 10-Jan-1956 DOA: 11/30/2015 PCP: Gennette Pac, MD  HPI/Subjective: Feels well, no pain  Assessment/Plan:  Small bowel perforation with contained phlegmon/abscess -secondary to terminal ileal Crohn's disease with contained abscess/perforation. -CT suggested extensive involvement of the terminal ileum with the Crohn's disease. -General surgery consulted, recommended conservative management for now. -Was on Zosyn, switched to Cipro/Flagyl, now back on IV Zosyn again -Repeat CT scan showed enlarging mesenteric abscess. -now back on TPN, IV Zosyn -per CCS, Gi following too, not appropriate for IV steroids at this time  -remarkable stable and improving, on clears now, wean TNA as po intake increases  Crohn's ileitis -not on any medications at home, gastroenterology consulted. On antibiotics, improving clinically, continue to hold the steroids.  GERD -PPI  Thrombocytopenia -due to sepsis, resolved  dvt proph: lovenox  Code Status: Full Code Family Communication: Plan discussed with the patient. Disposition Plan: Remains inpatient  Consultants:  GI  Gen. surgery  Procedures:  None  Antibiotics:  Zosyn---->3/1--->3/2  Cipro/[place]-----3/2   Objective: Filed Vitals:   12/07/15 2158 12/08/15 0600  BP: 110/71 106/64  Pulse: 72 75  Temp: 98 F (36.7 C) 97.8 F (36.6 C)  Resp: 18 18    Intake/Output Summary (Last 24 hours) at 12/08/15 1301 Last data filed at 12/08/15 0603  Gross per 24 hour  Intake     60 ml  Output   2500 ml  Net  -2440 ml   Filed Weights   11/30/15 2304  Weight: 83.462 kg (184 lb)    Exam: General: Alert and awake, oriented x3, not in any acute distress. HEENT: anicteric sclera, pupils reactive to light and accommodation, EOMI CVS: S1-S2 clear, no murmur rubs or gallops Chest: clear to auscultation bilaterally, no wheezing, rales or rhonchi Abdomen: soft  nontender, nondistended, normal bowel sounds, no organomegaly Extremities: no cyanosis, clubbing or edema noted bilaterally Neuro: Cranial nerves II-XII intact, no focal neurological deficits  Data Reviewed: Basic Metabolic Panel:  Recent Labs Lab 12/04/15 0515 12/05/15 0425 12/06/15 0520 12/07/15 0518 12/08/15 0435  NA 140 138 143 140 141  K 3.7 3.3* 3.3* 3.6 4.1  CL 110 109 110 109 108  CO2 22 21* 26 25 25   GLUCOSE 105* 95 145* 144* 139*  BUN 6 <5* <5* <5* 8  CREATININE 0.76 0.77 0.76 0.80 0.84  CALCIUM 8.1* 8.0* 8.4* 8.1* 8.5*  MG  --   --  2.0 2.1 2.3  PHOS  --   --  2.8 3.0 3.6   Liver Function Tests:  Recent Labs Lab 12/06/15 0520 12/08/15 0435  AST 17 20  ALT 20 18  ALKPHOS 60 78  BILITOT 0.6 0.5  PROT 5.3* 5.8*  ALBUMIN 2.4* 2.6*   No results for input(s): LIPASE, AMYLASE in the last 168 hours. No results for input(s): AMMONIA in the last 168 hours. CBC:  Recent Labs Lab 12/04/15 0515 12/06/15 0810 12/08/15 0435  WBC 7.8 10.2 8.2  NEUTROABS  --  8.2*  --   HGB 14.2 14.4 14.1  HCT 41.4 42.8 42.9  MCV 85.7 86.6 88.5  PLT 138* 239 311   Cardiac Enzymes: No results for input(s): CKTOTAL, CKMB, CKMBINDEX, TROPONINI in the last 168 hours. BNP (last 3 results) No results for input(s): BNP in the last 8760 hours.  ProBNP (last 3 results) No results for input(s): PROBNP in the last 8760 hours.  CBG:  Recent Labs Lab 12/07/15 [id number] 12/07/15 1613 12/07/15 2347 12/08/15 0745  12/08/15 1210  GLUCAP 115* 110* 118* 104* 101*    Micro No results found for this or any previous visit (from the past 240 hour(s)).   Studies: No results found.  Scheduled Meds: . enoxaparin (LOVENOX) injection  40 mg Subcutaneous Q24H  . fluticasone  1 spray Each Nare Daily  . insulin aspart  0-9 Units Subcutaneous Q8H  . lip balm  1 application Topical BID  . metronidazole  500 mg Intravenous Q8H  . pantoprazole (PROTONIX) IV  40 mg Intravenous QHS  .  piperacillin-tazobactam (ZOSYN)  IV  3.375 g Intravenous 3 times per day   Continuous Infusions: . Marland KitchenTPN (CLINIMIX-E) Adult 83 mL/hr at 12/07/15 1759   And  . fat emulsion 240 mL (12/07/15 1800)  . Marland KitchenTPN (CLINIMIX-E) Adult     And  . fat emulsion         Time spent: 35 minutes    Nathan Shaw  [facility] Pager [phone] If 7PM-7AM, please contact night-coverage at [email or web address], password [facility] 12/08/2015, 1:01 PM  LOS: 8 days

## 2015-12-08 NOTE — Progress Notes (Signed)
Night shift RN aware K. Schorr paged and texted regarding pt's recent "bloody" stool. VSS. Pt denies pain or discomfort. Awaiting call back.

## 2015-12-08 NOTE — Progress Notes (Signed)
[facility] NOTE   Pharmacy Consult for TPN Indication: crohns with ileal perforation and abscess currently NPO  No Known Allergies  Patient Measurements: Height: 5\' 11"  (180.3 cm) Weight: 184 lb (83.462 kg) IBW/kg (Calculated) : 75.3 Adjusted Body Weight:  Usual Weight:   Vital Signs: Temp: 97.8 F (36.6 C) (03/09 0600) Temp Source: Oral (03/09 0600) BP: 106/64 mmHg (03/09 0600) Pulse Rate: 75 (03/09 0600) Intake/Output from previous day: 03/08 0701 - 03/09 0700 In: 600 [P.O.:120; I.V.:480] Out: 2950 [Urine:2950] Intake/Output from this shift:    Labs:  Recent Labs  12/06/15 0810 12/08/15 0435  WBC 10.2 8.2  HGB 14.4 14.1  HCT 42.8 42.9  PLT 239 311     Recent Labs  12/06/15 0520 12/07/15 0518 12/08/15 0435  NA 143 140 141  K 3.3* 3.6 4.1  CL 110 109 108  CO2 26 25 25   GLUCOSE 145* 144* 139*  BUN <5* <5* 8  CREATININE 0.76 0.80 0.84  CALCIUM 8.4* 8.1* 8.5*  MG 2.0 2.1 2.3  PHOS 2.8 3.0 3.6  PROT 5.3*  --  5.8*  ALBUMIN 2.4*  --  2.6*  AST 17  --  20  ALT 20  --  18  ALKPHOS 60  --  78  BILITOT 0.6  --  0.5  PREALBUMIN 8.9*  --   --   TRIG 102  --   --    Estimated Creatinine Clearance: 103.3 mL/min (by C-G formula based on Cr of 0.84).    Recent Labs  12/07/15 1613 12/07/15 2347 12/08/15 0745  GLUCAP 110* 118* 104*    Medical History: Past Medical History  Diagnosis Date  . Crohn disease ([place]) last flare up dec 2014/ jan 2015  . Wears contact lenses     Insulin Requirements: on sensitive SSI q8h (no insulin over the past 24 hours) - no hx DM  Current Nutrition: NPO--> clear liquid started 3/8  IVF: D5 NS with 20 mEq/L at 60 ml/hr  Central access: PICC placement 3/6 TPN start date: 3/6  ASSESSMENT                                                                                                          HPI: Patient is a 60 y.o M with hx Crohn's disease (off medications for 2 years) presented to the ED on 3/1  with c/o abdominal pain, n/v, and diarrhea.  Abdominal CT showed Crohn's disease with a perforation and contained abscess in the distal ileum. No surgical intervention at this time per CCS.  To start TPN while patient is NPO  Significant events:  3/6 abd CT: Progressive changes of active Crohn disease involving the distal/terminal ileum with a persistent leak/ rupture and enlarging mesenteric abscess. 3/8: advancing to clear liquid 3/9: patient stated he ate 100% of dinner yesterday and breakfast this morning  Today:   Glucose (goal <150): all values <150  Electrolytes: lytes wnl (per RD, patient is at risk for refeeding syndrome, will monitor lytes closely this first week)  Renal: scr wnl  LFTs: WNL   TGs: 102 (3/7)  Prealbumin: 8.9 (3/7)  NUTRITIONAL GOALS                                                                                             RD recs (on 3/7):  Kcal: 1900-2100 Protein: 95-105g Fluid: 2L/day  Clinimix E 5/15 at a goal rate of 83 ml/hr + 20% fat emulsion at 10 ml/hr to provide: 100 g/day protein, 1894 Kcal/day.  PLAN                                                                                                    At 1800 today:  continue Clinimix E 5/15 at 83 ml/hr.  20% fat emulsion at 10 ml/hr.  TPN to contain standard multivitamins and trace elements.  continue IVF to [facility]  continue sensitive SSI q8h   TPN lab panels on Mondays & Thursdays.  F/u daily.   Dia Sitter, PharmD, BCPS 12/08/2015 7:53 AM

## 2015-12-08 NOTE — Progress Notes (Signed)
Pharmacy Antibiotic Note  Alias Grahn is a 60 y.o. male admitted on 11/30/2015 with Intra-abdominal infection.  Pharmacy was consulted for Zosyn dosing, then switched to Cipro/Flagyl. 3/6 switching back to Zosyn due to worsening clinical status.   - 3/6 abd CT: Progressive changes of active Crohn disease involving the distal/terminal ileum with a persistent leak/ rupture and enlarging mesenteric abscess. - Afeb, wbc wnl, scr 0.84 (crcl~100)  Plan: Zosyn 3.375g IV q8h (4 hour infusion).  With stable renal function, pharmacy will sign off for zosyn.   Height: 5\' 11"  (180.3 cm) Weight: 184 lb (83.462 kg) IBW/kg (Calculated) : 75.3  Temp (24hrs), Avg:97.9 F (36.6 C), Min:97.8 F (36.6 C), Max:98 F (36.7 C)   Recent Labs Lab 12/04/15 0515 12/05/15 0425 12/06/15 0520 12/06/15 0810 12/07/15 0518 12/08/15 0435  WBC 7.8  --   --  10.2  --  8.2  CREATININE 0.76 0.77 0.76  --  0.80 0.84    Estimated Creatinine Clearance: 103.3 mL/min (by C-G formula based on Cr of 0.84).    No Known Allergies  Antimicrobials this admission: Zosyn 3/1 >> 3/2, resume 3/6 >> 3/2 >> Cipro >> 3/6 3/2 >> Flagyl >> 3/6>> resume 3/8 per GI for Crohn's>>  Dose adjustments this admission: n/a  Microbiology results: No cultures  Thank you for allowing pharmacy to be a part of this patient's care.  Dia Sitter, PharmD, BCPS 12/08/2015 7:59 AM

## 2015-12-08 NOTE — Clinical Documentation Improvement (Signed)
Internal Medicine  Can the diagnosis of systemic infection be further specified? Please clarify if  Sepsis was POA    Sepsis - specify causative organism if known  Determine if there is Severe Sepsis (Sepsis with organ dysfunction - specify), Septic Shock if present}  Other  Clinically Undetermined  Document any associated diagnoses/conditions.   Supporting Information:  Progress  Note 12/07/15: Thrombocytopenia -due to sepsis, resolved   Please exercise your independent, professional judgment when responding. A specific answer is not anticipated or expected. Please update your documentation within the medical record to reflect your response to this query. Thank you  Thank You,  Ed[place] [phone]

## 2015-12-08 NOTE — Progress Notes (Signed)
Subjective: Pt tolerating clear liquid diet without nausea or abd pain. +flatus and notes 2 BMs this AM. Pt wondering if we are going to do another scan.   Objective: Vital signs in last 24 hours: Temp:  [97.8 F (36.6 C)-98 F (36.7 C)] 97.8 F (36.6 C) (03/09 0600) Pulse Rate:  [69-75] 75 (03/09 0600) Resp:  [18] 18 (03/09 0600) BP: (106-117)/(64-72) 106/64 mmHg (03/09 0600) SpO2:  [100 %] 100 % (03/09 0600) Last BM Date: 12/06/15  Diet clears BM x2 this AM Afebrile, VSS WBC decreased from 12/06/15  Intake/Output from previous day: 03/08 0701 - 03/09 0700 In: 600 [P.O.:120; I.V.:480] Out: 2950 [Urine:2950] Intake/Output this shift:    Gen: alert, NAD Abd: soft, nondistended, minimal tenderness to deep palpation, +BS   Lab Results:   Recent Labs  12/06/15 0810 12/08/15 0435  WBC 10.2 8.2  HGB 14.4 14.1  HCT 42.8 42.9  PLT 239 311   BMET  Recent Labs  12/07/15 0518 12/08/15 0435  NA 140 141  K 3.6 4.1  CL 109 108  CO2 25 25  GLUCOSE 144* 139*  BUN <5* 8  CREATININE 0.80 0.84  CALCIUM 8.1* 8.5*   PT/INR No results for input(s): LABPROT, INR in the last 72 hours. ABG No results for input(s): PHART, HCO3 in the last 72 hours.  Invalid input(s): PCO2, PO2  Studies/Results: No results found.  Anti-infectives: Anti-infectives    Start     Dose/Rate Route Frequency Ordered Stop   12/07/15 1200  metroNIDAZOLE (FLAGYL) IVPB 500 mg     500 mg 100 mL/hr over 60 Minutes Intravenous Every 8 hours 12/07/15 1032     12/06/15 0600  piperacillin-tazobactam (ZOSYN) IVPB 3.375 g     3.375 g 12.5 mL/hr over 240 Minutes Intravenous 3 times per day 12/05/15 2208     12/05/15 2215  piperacillin-tazobactam (ZOSYN) IVPB 3.375 g     3.375 g 12.5 mL/hr over 240 Minutes Intravenous NOW 12/05/15 2208 12/06/15 0324   12/01/15 1800  ciprofloxacin (CIPRO) IVPB 400 mg  Status:  Discontinued     400 mg 200 mL/hr over 60 Minutes Intravenous Every 12 hours 12/01/15 1657  12/05/15 1217   12/01/15 1800  metroNIDAZOLE (FLAGYL) IVPB 500 mg  Status:  Discontinued     500 mg 100 mL/hr over 60 Minutes Intravenous Every 6 hours 12/01/15 1657 12/05/15 1217   12/01/15 0600  piperacillin-tazobactam (ZOSYN) IVPB 3.375 g  Status:  Discontinued     3.375 g 12.5 mL/hr over 240 Minutes Intravenous 3 times per day 12/01/15 0459 12/01/15 1701   11/30/15 2030  piperacillin-tazobactam (ZOSYN) IVPB 3.375 g     3.375 g 100 mL/hr over 30 Minutes Intravenous  Once 11/30/15 2019 11/30/15 2113   11/30/15 2015  piperacillin-tazobactam (ZOSYN) IVPB 3.375 g  Status:  Discontinued     3.375 g 12.5 mL/hr over 240 Minutes Intravenous  Once 11/30/15 2003 11/30/15 2019      Assessment/Plan: HD #9 Crohn's disease  Distal ileal perforation with abscess (4.x x 8.3 x 7.6cm)  -Continue medical management of abscess.  -Continue TPN through PICC, advance diet, follow possible symptomatology of advancement of diet -IV antibiotics - Zosyn Day #4, Flagyl Day #1 (started by Dr. Amedeo Plenty) -GI advises holding off on treatment with steroids for now given infectious issues.  -No plans for surgical management/intervention at this time. Repeat CT (3/6) shows progressive changes of active Crohn's disease involving distal and terminal ileum with persistent leak/rupture and enlarging phlegmon. Actual  fluid collection slightly smaller.    LOS: 8 days    Delma Post, [place] 12/08/2015

## 2015-12-08 NOTE — Progress Notes (Signed)
[facility] Progress Note  Subjective: Patient remains relatively asymptomatic no particular pain  Objective: Vital signs in last 24 hours: Temp:  [97.8 F (36.6 C)-98 F (36.7 C)] 97.8 F (36.6 C) (03/09 0600) Pulse Rate:  [69-75] 75 (03/09 0600) Resp:  [18] 18 (03/09 0600) BP: (106-117)/(64-72) 106/64 mmHg (03/09 0600) SpO2:  [100 %] 100 % (03/09 0600) Weight change:    PE: Vital signs stable, afebrile  Lab Results: Results for orders placed or performed during the hospital encounter of 11/30/15 (from the past 24 hour(s))  Glucose, capillary     Status: Abnormal   Collection Time: 12/07/15  8:36 AM  Result Value Ref Range   Glucose-Capillary 115 (H) 65 - 99 mg/dL  Glucose, capillary     Status: Abnormal   Collection Time: 12/07/15  4:13 PM  Result Value Ref Range   Glucose-Capillary 110 (H) 65 - 99 mg/dL  Glucose, capillary     Status: Abnormal   Collection Time: 12/07/15 11:47 PM  Result Value Ref Range   Glucose-Capillary 118 (H) 65 - 99 mg/dL  Comprehensive metabolic panel     Status: Abnormal   Collection Time: 12/08/15  4:35 AM  Result Value Ref Range   Sodium 141 135 - 145 mmol/L   Potassium 4.1 3.5 - 5.1 mmol/L   Chloride 108 101 - 111 mmol/L   CO2 25 22 - 32 mmol/L   Glucose, Bld 139 (H) 65 - 99 mg/dL   BUN 8 6 - 20 mg/dL   Creatinine, Ser 0.84 0.61 - 1.24 mg/dL   Calcium 8.5 (L) 8.9 - 10.3 mg/dL   Total Protein 5.8 (L) 6.5 - 8.1 g/dL   Albumin 2.6 (L) 3.5 - 5.0 g/dL   AST 20 15 - 41 U/L   ALT 18 17 - 63 U/L   Alkaline Phosphatase 78 38 - 126 U/L   Total Bilirubin 0.5 0.3 - 1.2 mg/dL   GFR calc non Af Amer >60 >60 mL/min   GFR calc Af Amer >60 >60 mL/min   Anion gap 8 5 - 15  Magnesium     Status: None   Collection Time: 12/08/15  4:35 AM  Result Value Ref Range   Magnesium 2.3 1.7 - 2.4 mg/dL  Phosphorus     Status: None   Collection Time: 12/08/15  4:35 AM  Result Value Ref Range   Phosphorus 3.6 2.5 - 4.6 mg/dL  CBC     Status:  None   Collection Time: 12/08/15  4:35 AM  Result Value Ref Range   WBC 8.2 4.0 - 10.5 K/uL   RBC 4.85 4.22 - 5.81 MIL/uL   Hemoglobin 14.1 13.0 - 17.0 g/dL   HCT 42.9 39.0 - 52.0 %   MCV 88.5 78.0 - 100.0 fL   MCH 29.1 26.0 - 34.0 pg   MCHC 32.9 30.0 - 36.0 g/dL   RDW 13.1 11.5 - 15.5 %   Platelets 311 150 - 400 K/uL    Studies/Results: No results found.    Assessment: Crohn's ileitis with abscess/phlegmon, clinically stable with some recent radiologic worsening  Plan: I continue to favor holding off on steroids Flagyl added Dear liquid diet begun, advance as tolerated Discontinue TPN if meeting nutritional needs orally Repeat CT scan at some point Will continue to follow    Chessie Neuharth C 12/08/2015, 7:10 AM  Pager [phone] If no answer or after 5 PM call [phone]

## 2015-12-08 NOTE — Progress Notes (Signed)
Nutrition Follow-up  INTERVENTION:   TPN per Pharmacy Diet advancement per surgery RD to continue to monitor  NUTRITION DIAGNOSIS:   Inadequate oral intake related to nausea, vomiting as evidenced by per patient/family report.  Improving.  GOAL:   Patient will meet greater than or equal to 90% of their needs  Meeting with TPN.  MONITOR:   Diet advancement, Labs, Weight trends, I & O's, Other (Comment) (TPN)  ASSESSMENT:   60 y/o white male with PMH Crohn's disease in remission last 2 years and off medications presents to [facility] because of worsening abdominal pain. Patient's symptoms started Monday with N/V/D, fever/chills. He thought he had the flu. On 11/30/15 the patient started having 5/10 intermittent aching abdominal pain which was initially periumbilical but migrated to the lower quadrants. Last BM was yesterday.   Patient tolerated clear liquids with no issue, diet has been advanced to full liquids. Will monitor PO intake. TPN at goal.  Plan per Pharmacy 3/9: At 1800 today:  continue Clinimix E 5/15 at 83 ml/hr.  20% fat emulsion at 10 ml/hr.  Labs reviewed: CBGs: 101-118 Mg/Phos WNL  Diet Order:  .TPN (CLINIMIX-E) Adult Diet full liquid Room service appropriate?: Yes; Fluid consistency:: Thin .TPN (CLINIMIX-E) Adult  Skin:  Reviewed, no issues  Last BM:  3/7  Height:   Ht Readings from Last 1 Encounters:  11/30/15 5\' 11"  (1.803 m)    Weight:   Wt Readings from Last 1 Encounters:  11/30/15 184 lb (83.462 kg)    Ideal Body Weight:  78.2 kg  BMI:  Body mass index is 25.67 kg/(m^2).  Estimated Nutritional Needs:   Kcal:  1900-2100  Protein:  95-105g  Fluid:  2L/day  EDUCATION NEEDS:   No education needs identified at this time  Clayton Bibles, MS, RD, LDN Pager: [phone] After Hours Pager: [phone]

## 2015-12-09 LAB — GLUCOSE, CAPILLARY
Glucose-Capillary: 95 mg/dL (ref 65–99)
Glucose-Capillary: 94 mg/dL (ref 65–99)
Glucose-Capillary: 122 mg/dL — ABNORMAL HIGH (ref 65–99)

## 2015-12-09 MED ORDER — FAT EMULSION 20 % IV EMUL
144.0000 mL | INTRAVENOUS | Status: DC
  Administered 2015-12-09: 144 mL via INTRAVENOUS
  Filled 2015-12-09: qty 250

## 2015-12-09 MED ORDER — TRACE MINERALS CR-CU-MN-SE-ZN 10-1000-500-60 MCG/ML IV SOLN
INTRAVENOUS | Status: DC
  Administered 2015-12-09: 17:00:00 via INTRAVENOUS
  Filled 2015-12-09: qty 1320

## 2015-12-09 MED ORDER — BOOST / RESOURCE BREEZE PO LIQD
1.0000 | Freq: Three times a day (TID) | ORAL | Status: DC
  Administered 2015-12-09 – 2015-12-12 (×9): 1 via ORAL

## 2015-12-09 MED ORDER — PANTOPRAZOLE SODIUM 40 MG PO TBEC
40.0000 mg | DELAYED_RELEASE_TABLET | Freq: Every day | ORAL | Status: DC
  Administered 2015-12-09 – 2015-12-12 (×4): 40 mg via ORAL
  Filled 2015-12-09 (×4): qty 1

## 2015-12-09 NOTE — Progress Notes (Addendum)
[facility] NOTE   Pharmacy Consult for TPN Indication: crohns with ileal perforation and abscess  No Known Allergies  Patient Measurements: Height: 5\' 11"  (180.3 cm) Weight: 184 lb (83.462 kg) IBW/kg (Calculated) : 75.3 Adjusted Body Weight:  Usual Weight:   Vital Signs: Temp: 98.2 F (36.8 C) (03/10 0518) Temp Source: Oral (03/10 0518) BP: 111/69 mmHg (03/10 0518) Pulse Rate: 66 (03/10 0518) Intake/Output from previous day: 03/09 0701 - 03/10 0700 In: -  Out: 3125 [Urine:3125] Intake/Output from this shift:    Labs:  Recent Labs  12/06/15 0810 12/08/15 0435  WBC 10.2 8.2  HGB 14.4 14.1  HCT 42.8 42.9  PLT 239 311     Recent Labs  12/07/15 0518 12/08/15 0435  NA 140 141  K 3.6 4.1  CL 109 108  CO2 25 25  GLUCOSE 144* 139*  BUN <5* 8  CREATININE 0.80 0.84  CALCIUM 8.1* 8.5*  MG 2.1 2.3  PHOS 3.0 3.6  PROT  --  5.8*  ALBUMIN  --  2.6*  AST  --  20  ALT  --  18  ALKPHOS  --  78  BILITOT  --  0.5   Estimated Creatinine Clearance: 103.3 mL/min (by C-G formula based on Cr of 0.84).    Recent Labs  12/08/15 1210 12/08/15 1638 12/08/15 2351  GLUCAP 101* 103* 95    Medical History: Past Medical History  Diagnosis Date  . Crohn disease ([place]) last flare up dec 2014/ jan 2015  . Wears contact lenses     Insulin Requirements: on sensitive SSI q8h (no insulin over the past 24 hours) - no hx DM  Current Nutrition: full liquids 3/9  IVF: none  Central access: PICC placement 3/6 TPN start date: 3/6  ASSESSMENT                                                                                                          HPI: Patient is a 60 y.o M with hx Crohn's disease (off medications for 2 years) presented to the ED on 3/1 with c/o abdominal pain, n/v, and diarrhea.  Abdominal CT showed Crohn's disease with a perforation and contained abscess in the distal ileum. No surgical intervention at this time per CCS.  To start TPN while  patient is NPO  Significant events:  3/6 abd CT: Progressive changes of active Crohn disease involving the distal/terminal ileum with a persistent leak/ rupture and enlarging mesenteric abscess. 3/8: advancing to clear liquid 3/9: patient stated he ate 100% of dinner yesterday and breakfast this morning 3/10: possible bloody BM overnight per RN report  Today:   Glucose (goal <150): all values <150  Electrolytes: 3/9 - lytes wnl (per RD, patient is at risk for refeeding syndrome, will monitor lytes closely this first week)  Renal: scr wnl  LFTs: WNL   TGs: 102 (3/7)  Prealbumin: 8.9 (3/7)  NUTRITIONAL GOALS  RD recs (on 3/7):  Kcal: 1900-2100 Protein: 95-105g Fluid: 2L/day  Clinimix E 5/15 at a goal rate of 83 ml/hr + 20% fat emulsion at 10 ml/hr to provide: 100 g/day protein, 1894 Kcal/day.  PLAN                                                                                                    At 1800 today:  Start to wean TPN, decrease Clinimix E 5/15 to 55 ml/hr.  20% fat emulsion at 6 ml/hr.  To provide 66gm protein and 1225 kcals  Continue to wean as diet advanced and tolerates  TPN to contain standard multivitamins and trace elements.  continue IVF to [facility]  continue sensitive SSI q8h   TPN lab panels on Mondays & Thursdays.  F/u daily.  Doreene Eland, PharmD, BCPS.   Pager: [phone] 12/09/2015 7:36 AM

## 2015-12-09 NOTE — Progress Notes (Signed)
Calorie Count Note  48 hour calorie count ordered today at 1000.  Diet: Soft (advanced from FLD today at 0916) Supplements: order placed for Boost Breeze po TID at this time, each supplement provides 250 kcal and 9 grams of protein   Nutrition Dx: Inadequate oral intake related to nausea/vomiting as evidenced by pt report.  Goal: Patient will meet greater than or equal to 90% of their needs.  Intervention:  - Boost Breeze TID - TPN per pharmacy - RD will continue to monitor for additional nutrition-related needs and adjustments.  Estimated Nutritional Needs:  Kcal: 1900-2100 Protein: 95-105g Fluid: 2L/day  Pt is currently receiving Clinimix E 5/15 @ 83 mL/hr with 20% lipids @ 10 mL/hr. Pharmacy note from this AM indicates plan to begin weaning TPN; plan for wean to Clinimix E 5/15 @ 55 mL/hr with 20% lipids @ 6 mL/hr starting at 1800 today. This regimen will provide 66 grams of protein (69.5% minimum estimated protein needs) and 1225 kcal (64.5% minimum estimated kcal needs).     Jarome Matin, RD, LDN Inpatient Clinical Dietitian Pager # [phone] After hours/weekend pager # [phone]

## 2015-12-09 NOTE — Progress Notes (Signed)
Patient ID: Nathan Shaw, male   DOB: 17-Feb-1956, 60 y.o.   MRN: [id number]    Subjective: Pt tolerated full liquids with no change in symptoms.  Had 1 stool yesterday with some blood in it.   Objective: Vital signs in last 24 hours: Temp:  [98.2 F (36.8 C)-98.6 F (37 C)] 98.2 F (36.8 C) (03/10 0518) Pulse Rate:  [66-81] 66 (03/10 0518) Resp:  [18] 18 (03/10 0518) BP: (111-114)/(67-75) 111/69 mmHg (03/10 0518) SpO2:  [99 %-100 %] 99 % (03/10 0518) Last BM Date: 12/09/15  Diet clears BM x2 this AM Afebrile, VSS WBC decreased from 12/06/15  Intake/Output from previous day: 03/09 0701 - 03/10 0700 In: -  Out: 3125 [Urine:3125] Intake/Output this shift:    Gen: alert, NAD Abd: soft, nondistended, minimal tenderness to deep palpation, +BS   Lab Results:   Recent Labs  12/08/15 0435  WBC 8.2  HGB 14.1  HCT 42.9  PLT 311   BMET  Recent Labs  12/07/15 0518 12/08/15 0435  NA 140 141  K 3.6 4.1  CL 109 108  CO2 25 25  GLUCOSE 144* 139*  BUN <5* 8  CREATININE 0.80 0.84  CALCIUM 8.1* 8.5*   PT/INR No results for input(s): LABPROT, INR in the last 72 hours. ABG No results for input(s): PHART, HCO3 in the last 72 hours.  Invalid input(s): PCO2, PO2  Studies/Results: No results found.  Anti-infectives: Anti-infectives    Start     Dose/Rate Route Frequency Ordered Stop   12/07/15 1200  metroNIDAZOLE (FLAGYL) IVPB 500 mg     500 mg 100 mL/hr over 60 Minutes Intravenous Every 8 hours 12/07/15 1032     12/06/15 0600  piperacillin-tazobactam (ZOSYN) IVPB 3.375 g     3.375 g 12.5 mL/hr over 240 Minutes Intravenous 3 times per day 12/05/15 2208     12/05/15 2215  piperacillin-tazobactam (ZOSYN) IVPB 3.375 g     3.375 g 12.5 mL/hr over 240 Minutes Intravenous NOW 12/05/15 2208 12/06/15 0324   12/01/15 1800  ciprofloxacin (CIPRO) IVPB 400 mg  Status:  Discontinued     400 mg 200 mL/hr over 60 Minutes Intravenous Every 12 hours 12/01/15 1657 12/05/15 1217   12/01/15 1800  metroNIDAZOLE (FLAGYL) IVPB 500 mg  Status:  Discontinued     500 mg 100 mL/hr over 60 Minutes Intravenous Every 6 hours 12/01/15 1657 12/05/15 1217   12/01/15 0600  piperacillin-tazobactam (ZOSYN) IVPB 3.375 g  Status:  Discontinued     3.375 g 12.5 mL/hr over 240 Minutes Intravenous 3 times per day 12/01/15 0459 12/01/15 1701   11/30/15 2030  piperacillin-tazobactam (ZOSYN) IVPB 3.375 g     3.375 g 100 mL/hr over 30 Minutes Intravenous  Once 11/30/15 2019 11/30/15 2113   11/30/15 2015  piperacillin-tazobactam (ZOSYN) IVPB 3.375 g  Status:  Discontinued     3.375 g 12.5 mL/hr over 240 Minutes Intravenous  Once 11/30/15 2003 11/30/15 2019      Assessment/Plan: HD #10 Crohn's disease  Distal ileal perforation with abscess (4.x x 8.3 x 7.6cm)  -Continue medical management of abscess.  Soft diet, calorie counts.  Would work toward discharge on soft diet/full liquids if adequate calories.   Wean TNA.   -IV antibiotics - Zosyn Day #4, Flagyl Day #1 (started by Dr. Amedeo Plenty).  Consider transition to oral antibiotics in next few days.   -GI advises holding off on treatment with steroids for now given infectious issues.  No current plan for repeat CT  unless worsens, although he may need one prior to starting immunosuppressive drugs.     LOS: 9 days    [place], [place] 12/09/2015

## 2015-12-09 NOTE — Progress Notes (Signed)
[facility] Progress Note  Subjective: Patient continues to be essentially asymptomatic, tolerating full liquid diet. Noted a little bit of blood in one stool last night with subsequent stool without visible blood.  Objective: Vital signs in last 24 hours: Temp:  [98.2 F (36.8 C)-98.6 F (37 C)] 98.2 F (36.8 C) (03/10 0518) Pulse Rate:  [66-81] 66 (03/10 0518) Resp:  [18] 18 (03/10 0518) BP: (111-114)/(67-75) 111/69 mmHg (03/10 0518) SpO2:  [99 %-100 %] 99 % (03/10 0518) Weight change:    PE: Unchanged  Lab Results: Results for orders placed or performed during the hospital encounter of 11/30/15 (from the past 24 hour(s))  Glucose, capillary     Status: Abnormal   Collection Time: 12/08/15 12:10 PM  Result Value Ref Range   Glucose-Capillary 101 (H) 65 - 99 mg/dL  Glucose, capillary     Status: Abnormal   Collection Time: 12/08/15  4:38 PM  Result Value Ref Range   Glucose-Capillary 103 (H) 65 - 99 mg/dL  Glucose, capillary     Status: None   Collection Time: 12/08/15 11:51 PM  Result Value Ref Range   Glucose-Capillary 95 65 - 99 mg/dL  Glucose, capillary     Status: None   Collection Time: 12/09/15  7:55 AM  Result Value Ref Range   Glucose-Capillary 94 65 - 99 mg/dL    Studies/Results: No results found.    Assessment: Crohn's disease with contained perforation, abscess, Not felt easily amenable to percutaneous drainage, clinically stable  Plan: Continue present diet, advance as tolerated, Wean TPN as allowed Continue Zosyn and Flagyl Follow-up CT scan when recommended by general surgery. Will continue to follow. Consider corticosteroids when objective evidence of improvement in abscess.    Han Vejar C 12/09/2015, 8:46 AM  Pager [phone] If no answer or after 5 PM call [phone]

## 2015-12-09 NOTE — Progress Notes (Signed)
[facility] PROGRESS NOTE   Nathan Shaw [id number] DOB: [birth year] DOA: 11/30/2015 PCP: Gennette Pac, MD  HPI/Subjective: Feels well, no pain  Assessment/Plan:  Small bowel perforation with contained phlegmon/abscess -secondary to terminal ileal Crohn's disease with contained abscess/perforation. -CT suggested extensive involvement of the terminal ileum with the Crohn's disease. -General surgery consulted, recommended conservative management for now. -Was on Zosyn, switched to Cipro/Flagyl, now back on IV Zosyn again -Repeat CT scan showed enlarging mesenteric abscess. -now back on TPN, IV Zosyn -per [facility], Gi following too, not appropriate for IV steroids at this time  -remarkably stable and improving, diet being advanced, wean TNA  -change to PO abx tomorrow if stable -followed by Dr.Johnson [facility] as outpatient  Crohn's ileitis -not on any medications at home, gastroenterology consulted. On antibiotics, improving clinically, continue to hold steroids.  GERD -PPI  Thrombocytopenia -due to sepsis, resolved  Dvt proph: lovenox  Code Status: Full Code Family Communication: Plan discussed with the patient. Disposition Plan: Remains inpatient  Consultants:  GI  Gen. surgery  Procedures:  None  Antibiotics:  Zosyn---->3/1--->3/2  Cipro/Metro-----3/2   Objective: Filed Vitals:   12/08/15 2144 12/09/15 0518  BP: 111/67 111/69  Pulse: 74 66  Temp: 98.6 F (37 C) 98.2 F (36.8 C)  Resp: 18 18    Intake/Output Summary (Last 24 hours) at 12/09/15 1359 Last data filed at 12/09/15 0518  Gross per 24 hour  Intake      0 ml  Output   2725 ml  Net  -2725 ml   Filed Weights   11/30/15 2304  Weight: 83.462 kg (184 lb)    Exam: General: Alert and awake, oriented x3, not in any acute distress. HEENT: anicteric sclera, pupils reactive to light and accommodation, EOMI CVS: S1-S2 clear, no murmur rubs or gallops Chest: clear to  auscultation bilaterally, no wheezing, rales or rhonchi Abdomen: soft nontender, nondistended, normal bowel sounds, no organomegaly Extremities: no cyanosis, clubbing or edema noted bilaterally Neuro: Cranial nerves II-XII intact, no focal neurological deficits  Data Reviewed: Basic Metabolic Panel:  Recent Labs Lab 12/04/15 0515 12/05/15 0425 12/06/15 0520 12/07/15 0518 12/08/15 0435  NA 140 138 143 140 141  K 3.7 3.3* 3.3* 3.6 4.1  CL 110 109 110 109 108  CO2 22 21* 26 25 25   GLUCOSE 105* 95 145* 144* 139*  BUN 6 <5* <5* <5* 8  CREATININE 0.76 0.77 0.76 0.80 0.84  CALCIUM 8.1* 8.0* 8.4* 8.1* 8.5*  MG  --   --  2.0 2.1 2.3  PHOS  --   --  2.8 3.0 3.6   Liver Function Tests:  Recent Labs Lab 12/06/15 0520 12/08/15 0435  AST 17 20  ALT 20 18  ALKPHOS 60 78  BILITOT 0.6 0.5  PROT 5.3* 5.8*  ALBUMIN 2.4* 2.6*   No results for input(s): LIPASE, AMYLASE in the last 168 hours. No results for input(s): AMMONIA in the last 168 hours. CBC:  Recent Labs Lab 12/04/15 0515 12/06/15 0810 12/08/15 0435  WBC 7.8 10.2 8.2  NEUTROABS  --  8.2*  --   HGB 14.2 14.4 14.1  HCT 41.4 42.8 42.9  MCV 85.7 86.6 88.5  PLT 138* 239 311   Cardiac Enzymes: No results for input(s): CKTOTAL, CKMB, CKMBINDEX, TROPONINI in the last 168 hours. BNP (last 3 results) No results for input(s): BNP in the last 8760 hours.  ProBNP (last 3 results) No results for input(s): PROBNP in the last 8760 hours.  CBG:  Recent Labs Lab 12/08/15 0745 12/08/15 1210 12/08/15 1638 12/08/15 2351 12/09/15 0755  GLUCAP 104* 101* 103* 95 94    Micro No results found for this or any previous visit (from the past 240 hour(s)).   Studies: No results found.  Scheduled Meds: . enoxaparin (LOVENOX) injection  40 mg Subcutaneous Q24H  . feeding supplement  1 Container Oral TID BM  . fluticasone  1 spray Each Nare Daily  . insulin aspart  0-9 Units Subcutaneous Q8H  . lip balm  1 application Topical  BID  . metronidazole  500 mg Intravenous Q8H  . pantoprazole  40 mg Oral Q1200  . piperacillin-tazobactam (ZOSYN)  IV  3.375 g Intravenous 3 times per day   Continuous Infusions: . Marland KitchenTPN (CLINIMIX-E) Adult 83 mL/hr at 12/08/15 1738   And  . fat emulsion 240 mL (12/08/15 1739)  . Marland KitchenTPN (CLINIMIX-E) Adult     And  . fat emulsion         Time spent: 35 minutes    Nathan Shaw  [facility] Pager [phone] If 7PM-7AM, please contact night-coverage at [email or web address], password [facility] 12/09/2015, 1:59 PM  LOS: 9 days

## 2015-12-10 LAB — BASIC METABOLIC PANEL
Anion gap: 7 (ref 5–15)
BUN: 11 mg/dL (ref 6–20)
CO2: 26 mmol/L (ref 22–32)
Calcium: 8.5 mg/dL — ABNORMAL LOW (ref 8.9–10.3)
Chloride: 108 mmol/L (ref 101–111)
Creatinine, Ser: 0.93 mg/dL (ref 0.61–1.24)
GFR calc Af Amer: >60 mL/min (ref >60)
GFR calc non Af Amer: >60 mL/min (ref >60)
Glucose, Bld: 134 mg/dL — ABNORMAL HIGH (ref 65–99)
Potassium: 3.9 mmol/L (ref 3.5–5.1)
Sodium: 141 mmol/L (ref 135–145)

## 2015-12-10 LAB — GLUCOSE, CAPILLARY
Glucose-Capillary: 120 mg/dL — ABNORMAL HIGH (ref 65–99)
Glucose-Capillary: 118 mg/dL — ABNORMAL HIGH (ref 65–99)

## 2015-12-10 LAB — CBC
HCT: 42.6 % (ref 39.0–52.0)
Hemoglobin: 13.8 g/dL (ref 13.0–17.0)
MCH: 28.8 pg (ref 26.0–34.0)
MCHC: 32.4 g/dL (ref 30.0–36.0)
MCV: 88.8 fL (ref 78.0–100.0)
Platelets: 309 10*3/uL (ref 150–400)
RBC: 4.8 MIL/uL (ref 4.22–5.81)
RDW: 13 % (ref 11.5–15.5)
WBC: 6.6 10*3/uL (ref 4.0–10.5)

## 2015-12-10 MED ORDER — METRONIDAZOLE 500 MG PO TABS
500.0000 mg | ORAL_TABLET | Freq: Three times a day (TID) | ORAL | Status: DC
  Administered 2015-12-10 – 2015-12-12 (×6): 500 mg via ORAL
  Filled 2015-12-10 (×9): qty 1

## 2015-12-10 MED ORDER — CIPROFLOXACIN HCL 500 MG PO TABS
500.0000 mg | ORAL_TABLET | Freq: Two times a day (BID) | ORAL | Status: DC
  Administered 2015-12-10 – 2015-12-12 (×5): 500 mg via ORAL
  Filled 2015-12-10 (×7): qty 1

## 2015-12-10 NOTE — Progress Notes (Signed)
[facility] PROGRESS NOTE   Nathan Shaw [id number] DOB: [birth year] DOA: 11/30/2015 PCP: Gennette Pac, MD  HPI/Subjective: Feels well, no pain, tolerating soft diet, having liquid BMs  Assessment/Plan:  Small bowel perforation with contained phlegmon/abscess -secondary to terminal ileal Crohn's disease with contained abscess/perforation. -CT suggested extensive involvement of the terminal ileum with the Crohn's disease. -General surgery consulted, recommended conservative management  -Has been on IV Zosyn, Repeat CT scan showed enlarging mesenteric abscess, then back on TPN, IV Zosyn -per [facility], Gi following too, not appropriate for IV steroids at this time  -remarkably stable and improving, diet being advanced, weaning off TNA  -change to PO abx today, tolerating soft diet -followed by Dr.Johnson [facility] as outpatient  Crohn's ileitis -not on any medications at home, gastroenterology consulted. On antibiotics, improving clinically, continue to hold steroids.  GERD -PPI  Thrombocytopenia -due to sepsis, resolved  Dvt proph: lovenox  Code Status: Full Code Family Communication: Plan discussed with the patient. Disposition Plan: Remains inpatient  Consultants:  GI  Gen. surgery  Procedures:  None  Antibiotics:  Zosyn---->3/1--->3/2  Cipro/Metro-----3/2   Objective: Filed Vitals:   12/09/15 2159 12/10/15 0514  BP: 115/73 108/62  Pulse: 81 71  Temp: 98.3 F (36.8 C) 97.9 F (36.6 C)  Resp: 18 18    Intake/Output Summary (Last 24 hours) at 12/10/15 0937 Last data filed at 12/10/15 0515  Gross per 24 hour  Intake      0 ml  Output   2250 ml  Net  -2250 ml   Filed Weights   11/30/15 2304  Weight: 83.462 kg (184 lb)    Exam: General: Alert and awake, oriented x3, not in any acute distress. HEENT: anicteric sclera, pupils reactive to light and accommodation, EOMI CVS: S1-S2 clear, no murmur rubs or gallops Chest: clear to  auscultation bilaterally, no wheezing, rales or rhonchi Abdomen: soft nontender, nondistended, normal bowel sounds, no organomegaly Extremities: no cyanosis, clubbing or edema noted bilaterally Neuro: Cranial nerves II-XII intact, no focal neurological deficits  Data Reviewed: Basic Metabolic Panel:  Recent Labs Lab 12/05/15 0425 12/06/15 0520 12/07/15 0518 12/08/15 0435 12/10/15 0550  NA 138 143 140 141 141  K 3.3* 3.3* 3.6 4.1 3.9  CL 109 110 109 108 108  CO2 21* 26 25 25 26   GLUCOSE 95 145* 144* 139* 134*  BUN <5* <5* <5* 8 11  CREATININE 0.77 0.76 0.80 0.84 0.93  CALCIUM 8.0* 8.4* 8.1* 8.5* 8.5*  MG  --  2.0 2.1 2.3  --   PHOS  --  2.8 3.0 3.6  --    Liver Function Tests:  Recent Labs Lab 12/06/15 0520 12/08/15 0435  AST 17 20  ALT 20 18  ALKPHOS 60 78  BILITOT 0.6 0.5  PROT 5.3* 5.8*  ALBUMIN 2.4* 2.6*   No results for input(s): LIPASE, AMYLASE in the last 168 hours. No results for input(s): AMMONIA in the last 168 hours. CBC:  Recent Labs Lab 12/04/15 0515 12/06/15 0810 12/08/15 0435 12/10/15 0550  WBC 7.8 10.2 8.2 6.6  NEUTROABS  --  8.2*  --   --   HGB 14.2 14.4 14.1 13.8  HCT 41.4 42.8 42.9 42.6  MCV 85.7 86.6 88.5 88.8  PLT 138* 239 311 309   Cardiac Enzymes: No results for input(s): CKTOTAL, CKMB, CKMBINDEX, TROPONINI in the last 168 hours. BNP (last 3 results) No results for input(s): BNP in the last 8760 hours.  ProBNP (last 3 results) No results for  input(s): PROBNP in the last 8760 hours.  CBG:  Recent Labs Lab 12/08/15 2351 12/09/15 0755 12/09/15 1557 12/09/15 2358 12/10/15 0739  GLUCAP 95 94 122* 120* 118*    Micro No results found for this or any previous visit (from the past 240 hour(s)).   Studies: No results found.  Scheduled Meds: . ciprofloxacin  500 mg Oral BID  . enoxaparin (LOVENOX) injection  40 mg Subcutaneous Q24H  . feeding supplement  1 Container Oral TID BM  . fluticasone  1 spray Each Nare Daily  .  insulin aspart  0-9 Units Subcutaneous Q8H  . lip balm  1 application Topical BID  . metroNIDAZOLE  500 mg Oral 3 times per day  . pantoprazole  40 mg Oral Q1200   Continuous Infusions: . Marland KitchenTPN (CLINIMIX-E) Adult 55 mL/hr at 12/09/15 1721   And  . fat emulsion 144 mL (12/09/15 1721)       Time spent: 35 minutes    Nathan Shaw  [facility] Pager [phone] If 7PM-7AM, please contact night-coverage at [email or web address], password [facility] 12/10/2015, 9:37 AM  LOS: 10 days

## 2015-12-10 NOTE — Progress Notes (Signed)
[facility] Progress Note  Subjective: The patient states he feels fine. He is undergoing treatment for Crohn's disease of terminal ileum with contained perforation and abscess. He is eating. He is being weaned off his TPN.  Objective: Vital signs in last 24 hours: Temp:  [97.9 F (36.6 C)-98.3 F (36.8 C)] 97.9 F (36.6 C) (03/11 0514) Pulse Rate:  [71-88] 71 (03/11 0514) Resp:  [18] 18 (03/11 0514) BP: (108-115)/(62-73) 108/62 mmHg (03/11 0514) SpO2:  [98 %-99 %] 98 % (03/11 0514) Weight change:    PE:  No distress  Heart regular rhythm  Lungs clear  Abdomen soft and nontender  Lab Results: Results for orders placed or performed during the hospital encounter of 11/30/15 (from the past 24 hour(s))  Glucose, capillary     Status: Abnormal   Collection Time: 12/09/15  3:57 PM  Result Value Ref Range   Glucose-Capillary 122 (H) 65 - 99 mg/dL  Glucose, capillary     Status: Abnormal   Collection Time: 12/09/15 11:58 PM  Result Value Ref Range   Glucose-Capillary 120 (H) 65 - 99 mg/dL  CBC     Status: None   Collection Time: 12/10/15  5:50 AM  Result Value Ref Range   WBC 6.6 4.0 - 10.5 K/uL   RBC 4.80 4.22 - 5.81 MIL/uL   Hemoglobin 13.8 13.0 - 17.0 g/dL   HCT 42.6 39.0 - 52.0 %   MCV 88.8 78.0 - 100.0 fL   MCH 28.8 26.0 - 34.0 pg   MCHC 32.4 30.0 - 36.0 g/dL   RDW 13.0 11.5 - 15.5 %   Platelets 309 150 - 400 K/uL  Basic metabolic panel     Status: Abnormal   Collection Time: 12/10/15  5:50 AM  Result Value Ref Range   Sodium 141 135 - 145 mmol/L   Potassium 3.9 3.5 - 5.1 mmol/L   Chloride 108 101 - 111 mmol/L   CO2 26 22 - 32 mmol/L   Glucose, Bld 134 (H) 65 - 99 mg/dL   BUN 11 6 - 20 mg/dL   Creatinine, Ser 0.93 0.61 - 1.24 mg/dL   Calcium 8.5 (L) 8.9 - 10.3 mg/dL   GFR calc non Af Amer >60 >60 mL/min   GFR calc Af Amer >60 >60 mL/min   Anion gap 7 5 - 15  Glucose, capillary     Status: Abnormal   Collection Time: 12/10/15  7:39 AM  Result  Value Ref Range   Glucose-Capillary 118 (H) 65 - 99 mg/dL    Studies/Results: No results found.    Assessment: Crohn's disease of terminal ileum with contained perforation and abscess which is being treated medically at this time.  Plan:   Continue current management. I suspect he will be able to go home and a couple of days. He can follow-up with Dr. Earle Gell his primary gastroenterologist.    Cassell Clement 12/10/2015, 9:59 AM  Pager: [phone] If no answer or after 5 PM call [phone]

## 2015-12-10 NOTE — Progress Notes (Signed)
Patient ID: Nathan Shaw, male   DOB: 04-Jul-1956, 60 y.o.   MRN: [id number] [facility] Surgery Progress Note:   * No surgery found *  Subjective: Mental status is clear and pleasant.  Up eating pancakes Objective: Vital signs in last 24 hours: Temp:  [97.9 F (36.6 C)-98.3 F (36.8 C)] 97.9 F (36.6 C) (03/11 0514) Pulse Rate:  [71-88] 71 (03/11 0514) Resp:  [18] 18 (03/11 0514) BP: (108-115)/(62-73) 108/62 mmHg (03/11 0514) SpO2:  [98 %-99 %] 98 % (03/11 0514)  Intake/Output from previous day: 03/10 0701 - 03/11 0700 In: -  Out: 2250 [Urine:2250] Intake/Output this shift:    Physical Exam: Work of breathing is normal.  Comfortable.    Lab Results:  Results for orders placed or performed during the hospital encounter of 11/30/15 (from the past 48 hour(s))  Glucose, capillary     Status: Abnormal   Collection Time: 12/08/15 12:10 PM  Result Value Ref Range   Glucose-Capillary 101 (H) 65 - 99 mg/dL  Glucose, capillary     Status: Abnormal   Collection Time: 12/08/15  4:38 PM  Result Value Ref Range   Glucose-Capillary 103 (H) 65 - 99 mg/dL  Glucose, capillary     Status: None   Collection Time: 12/08/15 11:51 PM  Result Value Ref Range   Glucose-Capillary 95 65 - 99 mg/dL  Glucose, capillary     Status: None   Collection Time: 12/09/15  7:55 AM  Result Value Ref Range   Glucose-Capillary 94 65 - 99 mg/dL  Glucose, capillary     Status: Abnormal   Collection Time: 12/09/15  3:57 PM  Result Value Ref Range   Glucose-Capillary 122 (H) 65 - 99 mg/dL  Glucose, capillary     Status: Abnormal   Collection Time: 12/09/15 11:58 PM  Result Value Ref Range   Glucose-Capillary 120 (H) 65 - 99 mg/dL  CBC     Status: None   Collection Time: 12/10/15  5:50 AM  Result Value Ref Range   WBC 6.6 4.0 - 10.5 K/uL   RBC 4.80 4.22 - 5.81 MIL/uL   Hemoglobin 13.8 13.0 - 17.0 g/dL   HCT 42.6 39.0 - 52.0 %   MCV 88.8 78.0 - 100.0 fL   MCH 28.8 26.0 - 34.0 pg   MCHC 32.4 30.0 -  36.0 g/dL   RDW 13.0 11.5 - 15.5 %   Platelets 309 150 - 400 K/uL  Basic metabolic panel     Status: Abnormal   Collection Time: 12/10/15  5:50 AM  Result Value Ref Range   Sodium 141 135 - 145 mmol/L   Potassium 3.9 3.5 - 5.1 mmol/L   Chloride 108 101 - 111 mmol/L   CO2 26 22 - 32 mmol/L   Glucose, Bld 134 (H) 65 - 99 mg/dL   BUN 11 6 - 20 mg/dL   Creatinine, Ser 0.93 0.61 - 1.24 mg/dL   Calcium 8.5 (L) 8.9 - 10.3 mg/dL   GFR calc non Af Amer >60 >60 mL/min   GFR calc Af Amer >60 >60 mL/min    Comment: (NOTE) The eGFR has been calculated using the CKD EPI equation. This calculation has not been validated in all clinical situations. eGFR's persistently <60 mL/min signify possible Chronic Kidney Disease.    Anion gap 7 5 - 15  Glucose, capillary     Status: Abnormal   Collection Time: 12/10/15  7:39 AM  Result Value Ref Range   Glucose-Capillary 118 (H) 65 -  99 mg/dL    Radiology/Results: No results found.  Anti-infectives: Anti-infectives    Start     Dose/Rate Route Frequency Ordered Stop   12/07/15 1200  metroNIDAZOLE (FLAGYL) IVPB 500 mg     500 mg 100 mL/hr over 60 Minutes Intravenous Every 8 hours 12/07/15 1032     12/06/15 0600  piperacillin-tazobactam (ZOSYN) IVPB 3.375 g     3.375 g 12.5 mL/hr over 240 Minutes Intravenous 3 times per day 12/05/15 2208     12/05/15 2215  piperacillin-tazobactam (ZOSYN) IVPB 3.375 g     3.375 g 12.5 mL/hr over 240 Minutes Intravenous NOW 12/05/15 2208 12/06/15 0324   12/01/15 1800  ciprofloxacin (CIPRO) IVPB 400 mg  Status:  Discontinued     400 mg 200 mL/hr over 60 Minutes Intravenous Every 12 hours 12/01/15 1657 12/05/15 1217   12/01/15 1800  metroNIDAZOLE (FLAGYL) IVPB 500 mg  Status:  Discontinued     500 mg 100 mL/hr over 60 Minutes Intravenous Every 6 hours 12/01/15 1657 12/05/15 1217   12/01/15 0600  piperacillin-tazobactam (ZOSYN) IVPB 3.375 g  Status:  Discontinued     3.375 g 12.5 mL/hr over 240 Minutes Intravenous  3 times per day 12/01/15 0459 12/01/15 1701   11/30/15 2030  piperacillin-tazobactam (ZOSYN) IVPB 3.375 g     3.375 g 100 mL/hr over 30 Minutes Intravenous  Once 11/30/15 2019 11/30/15 2113   11/30/15 2015  piperacillin-tazobactam (ZOSYN) IVPB 3.375 g  Status:  Discontinued     3.375 g 12.5 mL/hr over 240 Minutes Intravenous  Once 11/30/15 2003 11/30/15 2019      Assessment/Plan: Problem List: Patient Active Problem List   Diagnosis Date Noted  . GERD (gastroesophageal reflux disease) 12/03/2015  . Thrombocytopenia ([place]) 12/03/2015  . Crohn's ileitis ([place]) 11/30/2015  . Ileal perforation from Crohn's ileitis 11/30/2015    Successful abscess management thus far.   * No surgery found *    LOS: 10 days   Matt B. Hassell Done, MD, [facility] [phone] beeper [phone]  12/10/2015 8:11 AM

## 2015-12-11 LAB — BASIC METABOLIC PANEL
Anion gap: 5 (ref 5–15)
BUN: 11 mg/dL (ref 6–20)
CO2: 26 mmol/L (ref 22–32)
Calcium: 8.4 mg/dL — ABNORMAL LOW (ref 8.9–10.3)
Chloride: 110 mmol/L (ref 101–111)
Creatinine, Ser: 0.83 mg/dL (ref 0.61–1.24)
GFR calc Af Amer: >60 mL/min (ref >60)
GFR calc non Af Amer: >60 mL/min (ref >60)
Glucose, Bld: 101 mg/dL — ABNORMAL HIGH (ref 65–99)
Potassium: 3.7 mmol/L (ref 3.5–5.1)
Sodium: 141 mmol/L (ref 135–145)

## 2015-12-11 LAB — CBC
HCT: 42.5 % (ref 39.0–52.0)
Hemoglobin: 13.7 g/dL (ref 13.0–17.0)
MCH: 28.8 pg (ref 26.0–34.0)
MCHC: 32.2 g/dL (ref 30.0–36.0)
MCV: 89.3 fL (ref 78.0–100.0)
Platelets: 323 10*3/uL (ref 150–400)
RBC: 4.76 MIL/uL (ref 4.22–5.81)
RDW: 13 % (ref 11.5–15.5)
WBC: 6.5 10*3/uL (ref 4.0–10.5)

## 2015-12-11 NOTE — Progress Notes (Signed)
[facility] Progress Note  Subjective: The patient is doing well. He has no complaints of abdominal pain. He is eating. He is on oral antibiotics.  Objective: Vital signs in last 24 hours: Temp:  [97.8 F (36.6 C)-98.3 F (36.8 C)] 98 F (36.7 C) (03/12 0509) Pulse Rate:  [74-98] 74 (03/12 0509) Resp:  [16-18] 16 (03/12 0509) BP: (94-102)/(60-67) 98/62 mmHg (03/12 0509) SpO2:  [99 %-100 %] 99 % (03/12 0509) Weight change:    PE:  No distress  Nonicteric  Abdomen soft and nontender  Lab Results: Results for orders placed or performed during the hospital encounter of 11/30/15 (from the past 24 hour(s))  CBC     Status: None   Collection Time: 12/11/15  3:37 AM  Result Value Ref Range   WBC 6.5 4.0 - 10.5 K/uL   RBC 4.76 4.22 - 5.81 MIL/uL   Hemoglobin 13.7 13.0 - 17.0 g/dL   HCT 42.5 39.0 - 52.0 %   MCV 89.3 78.0 - 100.0 fL   MCH 28.8 26.0 - 34.0 pg   MCHC 32.2 30.0 - 36.0 g/dL   RDW 13.0 11.5 - 15.5 %   Platelets 323 150 - 400 K/uL  Basic metabolic panel     Status: Abnormal   Collection Time: 12/11/15  3:37 AM  Result Value Ref Range   Sodium 141 135 - 145 mmol/L   Potassium 3.7 3.5 - 5.1 mmol/L   Chloride 110 101 - 111 mmol/L   CO2 26 22 - 32 mmol/L   Glucose, Bld 101 (H) 65 - 99 mg/dL   BUN 11 6 - 20 mg/dL   Creatinine, Ser 0.83 0.61 - 1.24 mg/dL   Calcium 8.4 (L) 8.9 - 10.3 mg/dL   GFR calc non Af Amer >60 >60 mL/min   GFR calc Af Amer >60 >60 mL/min   Anion gap 5 5 - 15    Studies/Results: No results found.    Assessment: Crohn's disease with contained ileal perforation with abscess  Plan:   I believe that he can go home at this point. He is eating and he is on oral antibiotics. He can follow-up with Dr. Earle Gell his primary gastroenterologist. We will sign off. Call us if needed.    SAM F Kamryn Gauthier 12/11/2015, 10:21 AM  Pager: [phone] If no answer or after 5 PM call [phone]

## 2015-12-11 NOTE — Progress Notes (Signed)
Calorie Count Note  48 hour calorie count ordered. Day 1 and 2 results below.  Tickets were not filled out as instructed. Pt reports he has been eating most of his meals and is tolerating with no issues. May discharge tomorrow.  Diet: Soft diet Supplements: Boost Breeze po TID, each supplement provides 250 kcal and 9 grams of protein  3/10-3/11: Breakfast (3/11): Not documented on ticket Lunch: Not documented on ticket Dinner: Not documented on ticket Supplements: 750 kcal, 27g protein  Total intake: 750 kcal (39% of minimum estimated needs) --> some intakes not accounted for 27g protein (28% of minimum estimated needs) --> "  3/11-3/12: Breakfast (3/12): 527 kcal, 4g protein Lunch: Not documented on ticket Dinner: 468 kcal, 21g protein Supplements: 750 kcal, 27g protein  Total intake: 1745 kcal (92% of minimum estimated needs) --> some intakes not accounted for 52g protein (55% of minimum estimated needs) --> "  Estimated Nutritional Needs:  Kcal: 1900-2100 Protein: 95-105g Fluid: 2L/day  Nutrition Dx: Inadequate oral intake related to nausea/vomiting as evidenced by pt report.  Goal: Pt to meet >/= 90% of their estimated nutrition needs   Intervention:  -D/c Calorie Count -Continue Boost Breeze po TID, each supplement provides 250 kcal and 9 grams of protein -Will monitor for needs  Clayton Bibles, MS, RD, LDN Pager: [phone] After Hours Pager: [phone]

## 2015-12-11 NOTE — Progress Notes (Signed)
Patient ID: Nathan Shaw, male   DOB: [birth year], 58 y.o.   MRN: [id number] [facility] Surgery Progress Note:   * No surgery found *  Subjective: Mental status is clear.  Eating pancakes again for breakfast.  Primary is Dr. Earle Gell as well as GI specialist.   Objective: Vital signs in last 24 hours: Temp:  [97.8 F (36.6 C)-98.3 F (36.8 C)] 98 F (36.7 C) (03/12 0509) Pulse Rate:  [74-98] 74 (03/12 0509) Resp:  [16-18] 16 (03/12 0509) BP: (94-102)/(60-67) 98/62 mmHg (03/12 0509) SpO2:  [99 %-100 %] 99 % (03/12 0509)  Intake/Output from previous day: 03/11 0701 - 03/12 0700 In: -  Out: 2050 [Urine:2050] Intake/Output this shift:    Physical Exam: Work of breathing is normal. Abdomen is much better  Lab Results:  Results for orders placed or performed during the hospital encounter of 11/30/15 (from the past 48 hour(s))  Glucose, capillary     Status: Abnormal   Collection Time: 12/09/15  3:57 PM  Result Value Ref Range   Glucose-Capillary 122 (H) 65 - 99 mg/dL  Glucose, capillary     Status: Abnormal   Collection Time: 12/09/15 11:58 PM  Result Value Ref Range   Glucose-Capillary 120 (H) 65 - 99 mg/dL  CBC     Status: None   Collection Time: 12/10/15  5:50 AM  Result Value Ref Range   WBC 6.6 4.0 - 10.5 K/uL   RBC 4.80 4.22 - 5.81 MIL/uL   Hemoglobin 13.8 13.0 - 17.0 g/dL   HCT 42.6 39.0 - 52.0 %   MCV 88.8 78.0 - 100.0 fL   MCH 28.8 26.0 - 34.0 pg   MCHC 32.4 30.0 - 36.0 g/dL   RDW 13.0 11.5 - 15.5 %   Platelets 309 150 - 400 K/uL  Basic metabolic panel     Status: Abnormal   Collection Time: 12/10/15  5:50 AM  Result Value Ref Range   Sodium 141 135 - 145 mmol/L   Potassium 3.9 3.5 - 5.1 mmol/L   Chloride 108 101 - 111 mmol/L   CO2 26 22 - 32 mmol/L   Glucose, Bld 134 (H) 65 - 99 mg/dL   BUN 11 6 - 20 mg/dL   Creatinine, Ser 0.93 0.61 - 1.24 mg/dL   Calcium 8.5 (L) 8.9 - 10.3 mg/dL   GFR calc non Af Amer >60 >60 mL/min   GFR calc Af Amer >60 >60  mL/min    Comment: (NOTE) The eGFR has been calculated using the CKD EPI equation. This calculation has not been validated in all clinical situations. eGFR's persistently <60 mL/min signify possible Chronic Kidney Disease.    Anion gap 7 5 - 15  Glucose, capillary     Status: Abnormal   Collection Time: 12/10/15  7:39 AM  Result Value Ref Range   Glucose-Capillary 118 (H) 65 - 99 mg/dL  CBC     Status: None   Collection Time: 12/11/15  3:37 AM  Result Value Ref Range   WBC 6.5 4.0 - 10.5 K/uL   RBC 4.76 4.22 - 5.81 MIL/uL   Hemoglobin 13.7 13.0 - 17.0 g/dL   HCT 42.5 39.0 - 52.0 %   MCV 89.3 78.0 - 100.0 fL   MCH 28.8 26.0 - 34.0 pg   MCHC 32.2 30.0 - 36.0 g/dL   RDW 13.0 11.5 - 15.5 %   Platelets 323 150 - 400 K/uL  Basic metabolic panel     Status: Abnormal  Collection Time: 12/11/15  3:37 AM  Result Value Ref Range   Sodium 141 135 - 145 mmol/L   Potassium 3.7 3.5 - 5.1 mmol/L   Chloride 110 101 - 111 mmol/L   CO2 26 22 - 32 mmol/L   Glucose, Bld 101 (H) 65 - 99 mg/dL   BUN 11 6 - 20 mg/dL   Creatinine, Ser 0.83 0.61 - 1.24 mg/dL   Calcium 8.4 (L) 8.9 - 10.3 mg/dL   GFR calc non Af Amer >60 >60 mL/min   GFR calc Af Amer >60 >60 mL/min    Comment: (NOTE) The eGFR has been calculated using the CKD EPI equation. This calculation has not been validated in all clinical situations. eGFR's persistently <60 mL/min signify possible Chronic Kidney Disease.    Anion gap 5 5 - 15    Radiology/Results: No results found.  Anti-infectives: Anti-infectives    Start     Dose/Rate Route Frequency Ordered Stop   12/10/15 1400  metroNIDAZOLE (FLAGYL) tablet 500 mg     500 mg Oral 3 times per day 12/10/15 0936     12/10/15 1000  ciprofloxacin (CIPRO) tablet 500 mg     500 mg Oral 2 times daily 12/10/15 0936     12/07/15 1200  metroNIDAZOLE (FLAGYL) IVPB 500 mg  Status:  Discontinued     500 mg 100 mL/hr over 60 Minutes Intravenous Every 8 hours 12/07/15 1032 12/10/15 0936    12/06/15 0600  piperacillin-tazobactam (ZOSYN) IVPB 3.375 g  Status:  Discontinued     3.375 g 12.5 mL/hr over 240 Minutes Intravenous 3 times per day 12/05/15 2208 12/10/15 0936   12/05/15 2215  piperacillin-tazobactam (ZOSYN) IVPB 3.375 g     3.375 g 12.5 mL/hr over 240 Minutes Intravenous NOW 12/05/15 2208 12/06/15 0324   12/01/15 1800  ciprofloxacin (CIPRO) IVPB 400 mg  Status:  Discontinued     400 mg 200 mL/hr over 60 Minutes Intravenous Every 12 hours 12/01/15 1657 12/05/15 1217   12/01/15 1800  metroNIDAZOLE (FLAGYL) IVPB 500 mg  Status:  Discontinued     500 mg 100 mL/hr over 60 Minutes Intravenous Every 6 hours 12/01/15 1657 12/05/15 1217   12/01/15 0600  piperacillin-tazobactam (ZOSYN) IVPB 3.375 g  Status:  Discontinued     3.375 g 12.5 mL/hr over 240 Minutes Intravenous 3 times per day 12/01/15 0459 12/01/15 1701   11/30/15 2030  piperacillin-tazobactam (ZOSYN) IVPB 3.375 g     3.375 g 100 mL/hr over 30 Minutes Intravenous  Once 11/30/15 2019 11/30/15 2113   11/30/15 2015  piperacillin-tazobactam (ZOSYN) IVPB 3.375 g  Status:  Discontinued     3.375 g 12.5 mL/hr over 240 Minutes Intravenous  Once 11/30/15 2003 11/30/15 2019      Assessment/Plan: Problem List: Patient Active Problem List   Diagnosis Date Noted  . GERD (gastroesophageal reflux disease) 12/03/2015  . Thrombocytopenia ([place]) 12/03/2015  . Crohn's ileitis ([place]) 11/30/2015  . Ileal perforation from Crohn's ileitis 11/30/2015    From a surgical standpoint we will sign off.  Will need followup with Dr. Wynetta Emery post discharge.   * No surgery found *    LOS: 11 days   Matt B. Hassell Done, MD, [facility] [phone] beeper [phone]  12/11/2015 8:50 AM

## 2015-12-11 NOTE — Progress Notes (Signed)
[facility] PROGRESS NOTE   Nathan Shaw [id number] DOB: 07-12-1956 DOA: 11/30/2015 PCP: Gennette Pac, MD  HPI/Subjective: No issues, continues to feel well, no pain, tolerating soft diet, having liquid BMs  Assessment/Plan:  Small bowel perforation with contained phlegmon/abscess -secondary to terminal ileal Crohn's disease with contained abscess/perforation. -CT suggested extensive involvement of the terminal ileum with the Crohn's disease. -General surgery consulted, recommended conservative management  -Has been on IV Zosyn, Repeat CT scan showed enlarging mesenteric abscess, then back on TPN, IV Zosyn -per [facility], Gi following too, not appropriate for IV steroids at this time  -remarkably stable and improving, diet advanced,  -off TNA since yesterday, switched to Oral CIpro/Flagyl 3/11 -Home tomorrow if stable, will get him FU with Dr.[facility] , will need imaging before Rx for Crohns considered  Crohn's ileitis -not on any medications at home, gastroenterology consulted. On antibiotics, improving clinically, continue to hold steroids.  GERD -PPI  Thrombocytopenia -due to sepsis, resolved  Dvt proph: lovenox  Code Status: Full Code Family Communication: Plan discussed with the patient. Disposition Plan: home tomorrow  Consultants:  GI  Gen. surgery  Procedures:  None  Antibiotics:  Zosyn---->3/1--->3/2  Cipro/Metro-----3/2   Objective: Filed Vitals:   12/10/15 2052 12/11/15 0509  BP: 102/67 98/62  Pulse: 81 74  Temp: 98.3 F (36.8 C) 98 F (36.7 C)  Resp: 18 16    Intake/Output Summary (Last 24 hours) at 12/11/15 0959 Last data filed at 12/11/15 0509  Gross per 24 hour  Intake      0 ml  Output   2050 ml  Net  -2050 ml   Filed Weights   11/30/15 2304  Weight: 83.462 kg (184 lb)    Exam: General: Alert and awake, oriented x3, not in any acute distress. HEENT: anicteric sclera, pupils reactive to light and  accommodation, EOMI CVS: S1-S2 clear, no murmur rubs or gallops Chest: clear to auscultation bilaterally, no wheezing, rales or rhonchi Abdomen: soft nontender, nondistended, normal bowel sounds, no organomegaly Extremities: no cyanosis, clubbing or edema noted bilaterally Neuro: Cranial nerves II-XII intact, no focal neurological deficits  Data Reviewed: Basic Metabolic Panel:  Recent Labs Lab 12/06/15 0520 12/07/15 0518 12/08/15 0435 12/10/15 0550 12/11/15 0337  NA 143 140 141 141 141  K 3.3* 3.6 4.1 3.9 3.7  CL 110 109 108 108 110  CO2 26 25 25 26 26   GLUCOSE 145* 144* 139* 134* 101*  BUN <5* <5* 8 11 11   CREATININE 0.76 0.80 0.84 0.93 0.83  CALCIUM 8.4* 8.1* 8.5* 8.5* 8.4*  MG 2.0 2.1 2.3  --   --   PHOS 2.8 3.0 3.6  --   --    Liver Function Tests:  Recent Labs Lab 12/06/15 0520 12/08/15 0435  AST 17 20  ALT 20 18  ALKPHOS 60 78  BILITOT 0.6 0.5  PROT 5.3* 5.8*  ALBUMIN 2.4* 2.6*   No results for input(s): LIPASE, AMYLASE in the last 168 hours. No results for input(s): AMMONIA in the last 168 hours. CBC:  Recent Labs Lab 12/06/15 0810 12/08/15 0435 12/10/15 0550 12/11/15 0337  WBC 10.2 8.2 6.6 6.5  NEUTROABS 8.2*  --   --   --   HGB 14.4 14.1 13.8 13.7  HCT 42.8 42.9 42.6 42.5  MCV 86.6 88.5 88.8 89.3  PLT 239 311 309 323   Cardiac Enzymes: No results for input(s): CKTOTAL, CKMB, CKMBINDEX, TROPONINI in the last 168 hours. BNP (last 3 results) No results for  input(s): BNP in the last 8760 hours.  ProBNP (last 3 results) No results for input(s): PROBNP in the last 8760 hours.  CBG:  Recent Labs Lab 12/08/15 2351 12/09/15 0755 12/09/15 1557 12/09/15 2358 12/10/15 0739  GLUCAP 95 94 122* 120* 118*    Micro No results found for this or any previous visit (from the past 240 hour(s)).   Studies: No results found.  Scheduled Meds: . ciprofloxacin  500 mg Oral BID  . enoxaparin (LOVENOX) injection  40 mg Subcutaneous Q24H  . feeding  supplement  1 Container Oral TID BM  . fluticasone  1 spray Each Nare Daily  . lip balm  1 application Topical BID  . metroNIDAZOLE  500 mg Oral 3 times per day  . pantoprazole  40 mg Oral Q1200   Continuous Infusions:       Time spent: 35 minutes    Nathan Shaw  [facility] Pager [phone] If 7PM-7AM, please contact night-coverage at [email or web address], password [facility] 12/11/2015, 9:59 AM  LOS: 11 days

## 2015-12-12 DIAGNOSIS — K50114 Crohn's disease of large intestine with abscess: Secondary | ICD-10-CM | POA: Insufficient documentation

## 2015-12-12 LAB — CBC
HCT: 40.1 % (ref 39.0–52.0)
Hemoglobin: 13.6 g/dL (ref 13.0–17.0)
MCH: 28.8 pg (ref 26.0–34.0)
MCHC: 33.9 g/dL (ref 30.0–36.0)
MCV: 85 fL (ref 78.0–100.0)
Platelets: 329 10*3/uL (ref 150–400)
RBC: 4.72 MIL/uL (ref 4.22–5.81)
RDW: 12.8 % (ref 11.5–15.5)
WBC: 7.3 10*3/uL (ref 4.0–10.5)

## 2015-12-12 LAB — BASIC METABOLIC PANEL
Anion gap: 6 (ref 5–15)
BUN: 10 mg/dL (ref 6–20)
CO2: 26 mmol/L (ref 22–32)
Calcium: 8.5 mg/dL — ABNORMAL LOW (ref 8.9–10.3)
Chloride: 110 mmol/L (ref 101–111)
Creatinine, Ser: 0.97 mg/dL (ref 0.61–1.24)
GFR calc Af Amer: >60 mL/min (ref >60)
GFR calc non Af Amer: >60 mL/min (ref >60)
Glucose, Bld: 94 mg/dL (ref 65–99)
Potassium: 3.8 mmol/L (ref 3.5–5.1)
Sodium: 142 mmol/L (ref 135–145)

## 2015-12-12 MED ORDER — CIPROFLOXACIN HCL 500 MG PO TABS: 500.0000 mg | ORAL_TABLET | Freq: Two times a day (BID) | ORAL | Status: DC

## 2015-12-12 MED ORDER — UNABLE TO FIND: Status: DC

## 2015-12-12 MED ORDER — HYDROCODONE-ACETAMINOPHEN 5-325 MG PO TABS: 1.0000 | ORAL_TABLET | Freq: Four times a day (QID) | ORAL | Status: DC | PRN

## 2015-12-12 MED ORDER — SACCHAROMYCES BOULARDII 250 MG PO CAPS: 250.0000 mg | ORAL_CAPSULE | Freq: Two times a day (BID) | ORAL | Status: DC

## 2015-12-12 MED ORDER — ONDANSETRON 4 MG PO TBDP: 4.0000 mg | ORAL_TABLET | Freq: Three times a day (TID) | ORAL | Status: DC | PRN

## 2015-12-12 MED ORDER — METRONIDAZOLE 500 MG PO TABS: 500.0000 mg | ORAL_TABLET | Freq: Three times a day (TID) | ORAL | Status: DC

## 2015-12-12 NOTE — Discharge Summary (Signed)
Physician Discharge Summary  Nathan Shaw [id number] DOB: 05-04-1956 DOA: 11/30/2015  PCP: Gennette Pac, MD  Admit date: 11/30/2015 Discharge date: 12/12/2015  Time spent: 45 minutes  Recommendations for Outpatient Follow-up:  Dr.Martin Norwood [facility] on 3/22 at 11:15am  Discharge Diagnoses:  Principal Problem:   Ileal perforation from Crohn's ileitis   Intraabdominal abscess   Crohn's ileitis ([place])   GERD (gastroesophageal reflux disease)   Thrombocytopenia ([place])  Discharge Condition: stable  Diet recommendation: soft diet  Filed Weights   11/30/15 2304  Weight: 83.462 kg (184 lb)    History of present illness:  Chief Complaint: Abdominal pain. HPI: Nathan Shaw is a 60 y.o. male with history of Crohn's disease in remission last 2 years off medications presented to the ER because of worsening abdominal pain. Patient's symptoms started 3 days ago with nausea vomiting and diarrhea. 2/28 patient started having abdominal pain which was initially periumbilical became more towards the lower quadrants. CT abdomen and pelvis shows distal ileal perforation with possible abscess formation  Hospital Course:  Small bowel perforation with contained phlegmon/abscess -secondary to terminal ileal Crohn's disease with contained abscess/perforation. -CT 3/1 suggested extensive involvement of the terminal ileum with the Crohn's disease. -General surgery consulted, recommended conservative management. [facility] followed the patient throughout admission. -started on IV Zosyn on admission, then changed to PO Abx, Repeat CT scan on 3/6 showed enlarging mesenteric abscess still contained restarted on TPN, IV Zosyn -Gi following too, not appropriate for IV steroids at this time due to infection/abscess -remarkably stable and improved well over this past week with conservative management, diet advanced,  -off TNA since 3/11, switched to Oral CIpro/Flagyl 3/11 -Home today on Oral antibiotics,  made him FU with Dr.[facility] on 3/22, will need FU imaging before Rx for Crohns considered  Crohn's ileitis -not on any medications at home, gastroenterology followed him On antibiotics, improving clinically, continue to hold steroids.  GERD -PPI  Thrombocytopenia -due to sepsis, resolved  Consultations:  [facility]  [facility]  Discharge Exam: Filed Vitals:   12/11/15 2125 12/12/15 0519  BP: 109/63 99/61  Pulse: 87 73  Temp: 98 F (36.7 C) 98.2 F (36.8 C)  Resp: 16 16    General: AAOx3 Cardiovascular: S1S2/RRR Respiratory: CTAB  Discharge Instructions    Current Discharge Medication List    START taking these medications   Details  ciprofloxacin (CIPRO) 500 MG tablet Take 1 tablet (500 mg total) by mouth 2 (two) times daily. For 10days Qty: 20 tablet, Refills: 0    metroNIDAZOLE (FLAGYL) 500 MG tablet Take 1 tablet (500 mg total) by mouth every 8 (eight) hours. For 10days Qty: 30 tablet, Refills: 0    ondansetron (ZOFRAN-ODT) 4 MG disintegrating tablet Take 1 tablet (4 mg total) by mouth every 8 (eight) hours as needed for nausea or vomiting. Qty: 20 tablet, Refills: 0    saccharomyces boulardii (FLORASTOR) 250 MG capsule Take 1 capsule (250 mg total) by mouth 2 (two) times daily. For 10days Qty: 20 capsule, Refills: 0      CONTINUE these medications which have CHANGED   Details  HYDROcodone-acetaminophen (NORCO) 5-325 MG tablet Take 1-2 tablets by mouth every 6 (six) hours as needed for moderate pain or severe pain. Qty: 30 tablet, Refills: 0      CONTINUE these medications which have NOT CHANGED   Details  aspirin EC 81 MG tablet Take 81 mg by mouth daily.    calcium carbonate (OS-CAL) 600 MG TABS tablet Take  600 mg by mouth daily with breakfast.    cetirizine (ZYRTEC) 10 MG tablet Take 10 mg by mouth daily.    cholecalciferol (VITAMIN D) 1000 UNITS tablet Take 1,000 Units by mouth daily.    fluticasone (FLONASE) 50 MCG/ACT nasal spray Place 1  spray into both nostrils daily.    Misc Natural Products (GLUCOS-CHONDROIT-MSM COMPLEX PO) Take 1 tablet by mouth daily.     Multiple Vitamin (MULTIVITAMIN WITH MINERALS) TABS tablet Take 1 tablet by mouth daily.    omeprazole (PRILOSEC) 20 MG capsule Take 20 mg by mouth daily.    sildenafil (REVATIO) 20 MG tablet Take 20 mg by mouth daily as needed (erectile dysfunction).    testosterone (ANDROGEL) 50 MG/5GM (1%) GEL Place 5 g onto the skin daily.    Cyanocobalamin (VITAMIN B-12 IJ) Inject as directed every 30 (thirty) days.      STOP taking these medications     omega-3 acid ethyl esters (LOVAZA) 1 G capsule      valACYclovir (VALTREX) 500 MG tablet        No Known Allergies Follow-up Information    Follow up with Gennette Pac, MD In 1 week.   Specialty:  Family Medicine   Contact information:   [place] [address] [phone]       Follow up with Garlan Fair, MD.   Specialty:  Gastroenterology   Contact information:   [place] [place] [address] [phone]        The results of significant diagnostics from this hospitalization (including imaging, microbiology, ancillary and laboratory) are listed below for reference.    Significant Diagnostic Studies: Ct Abdomen Pelvis W Contrast  12/05/2015  CLINICAL DATA:  Abscess, Crohn disease. EXAM: CT ABDOMEN AND PELVIS WITH CONTRAST TECHNIQUE: Multidetector CT imaging of the abdomen and pelvis was performed using the standard protocol following bolus administration of intravenous contrast. CONTRAST:  139mL OMNIPAQUE IOHEXOL 300 MG/ML  SOLN COMPARISON:  11/30/2015. FINDINGS: Lower chest: Lung bases show no acute findings. Heart size normal. No pericardial or pleural effusion. Hepatobiliary: 4 mm low-attenuation lesion in the dome of the liver is too small to characterize. Liver and gallbladder are otherwise unremarkable. No biliary ductal dilatation. Pancreas: Negative.  Spleen: Negative. Adrenals/Urinary Tract: Adrenal glands and right kidney are unremarkable. Sub cm low-attenuation lesion in the left kidney is too small to characterize but statistically, likely represents a cyst. Ureters are decompressed. Bladder is unremarkable. Stomach/Bowel: Stomach is unremarkable. Scattered areas of suspected small bowel narrowing, as before. There is marked thickening of the distal/terminal ileum with hypervascular surrounding mesentery. Extraluminal collection of air, contrast and marked haziness in the ileocolic mesentery measures roughly 4.2 x 8.3 x 7.6 cm (series 2, image 59 and coronal image 40). The opacified collection of fluid and air measures 1.8 cm (2/66). An opacified tract is seen extending from the distal ileum, best imaged on coronal image 37. Findings are progressive from 11/30/2015. Colon is unremarkable. Vascular/Lymphatic: Atherosclerotic calcification of the arterial vasculature without abdominal aortic aneurysm. Lymph nodes in the ileocolic mesentery measure up to 9 mm, similar. Otherwise, no pathologically enlarged lymph nodes. Reproductive: Prostate is normal in size. Other: Small pelvic free fluid. Mesenteries and peritoneum are otherwise unremarkable. Musculoskeletal: No worrisome lytic or sclerotic lesions. IMPRESSION: 1. Progressive changes of active Crohn disease involving the distal/terminal ileum with a persistent leak/ rupture and enlarging mesenteric abscess. 2. Small pelvic free fluid. Electronically Signed   By: Lorin Picket M.D.  On: 12/05/2015 10:49   Ct Abdomen Pelvis W Contrast  11/30/2015  CLINICAL DATA:  Pt from PCP for possible appendicitis, co abd pain radiating to RLQ, fever x 3 days , WBC 10.3. denies nausea nor diarrhea today yet reports nausea 3 days ago. Alert and oriented x 4, no obvious distress at this time. History of Crohn's disease. EXAM: CT ABDOMEN AND PELVIS WITH CONTRAST TECHNIQUE: Multidetector CT imaging of the abdomen and pelvis  was performed using the standard protocol following bolus administration of intravenous contrast. CONTRAST:  39mL OMNIPAQUE IOHEXOL 300 MG/ML SOLN, 180mL OMNIPAQUE IOHEXOL 300 MG/ML SOLN COMPARISON:  CT 10/21/2009 FINDINGS: Lower chest: Lung bases are clear. Hepatobiliary: No focal hepatic lesion. No biliary duct dilatation. Gallbladder is normal. Common bile duct is normal. Pancreas: Pancreas is normal. No ductal dilatation. No pancreatic inflammation. Spleen: Normal spleen Adrenals/urinary tract: Adrenal glands and kidneys are normal. The ureters and bladder normal. Stomach/Bowel: Stomach and duodenum are normal. There is poor progression of the oral contrast through the small bowel. There is a long segment of bowel wall inflammation involving the distal ileum leading up to the terminal ileum with circumferential bowel wall thickening and some mild stricturing. This is a finding of small bowel inflammation i ssimilar to CT of 2011 Adjacent to this distal small bowel inflammation, there is a contained gas and fluid collection measuring 3.5 x 3.6 x 4.2 cm (image 76, series 2). This is adjacent and along the mesenteric border of the distal ileum approximately 7 cm from the terminal ileum. There is no clear evidence of fistulous connection to additional loops of bowel or other structures. The cecum is normal. The appendix not identified. Appendix not identified on CT of 10/21/2009 either. More proximally in the small bowel regions of stricturing and small bowel dilatation as seen on image 32, series 5. Long segment of stricturing on image 40 series 5. There is pre and post stenotic dilatation associated with this mid small bowel lesion. The ascending, transverse descending, and sigmoid colon are normal. Vascular/Lymphatic: Abdominal aorta is normal caliber with atherosclerotic calcification. There is no retroperitoneal or periportal lymphadenopathy. No pelvic lymphadenopathy. Reproductive: Prostate normal Other: No  free fluid. Musculoskeletal: No aggressive osseous lesion. IMPRESSION: 1. Gas and fluid collection along the mesenteric border the inflamed distal ileum consistent with small perforation with potential early abscess formation. No clear fistulous communication to other bowel structures. The findings consistent sequelae acute Crohn's inflammation. 2. Long segment of chronic distal ileal inflammation and stricturing. 3. Multiple additional foci of stricturing throughout the small bowel with pre and posts tenotic small dilatation. No evidence of high-grade obstruction. 4. Appendix not identified on this scan or scan from 2011. 5.  Atherosclerotic calcification of the abdominal aorta. Findings conveyed Nanci Pina on 11/30/2015  at19:33. Electronically Signed   By: Suzy Bouchard M.D.   On: 11/30/2015 19:33    Microbiology: No results found for this or any previous visit (from the past 240 hour(s)).   Labs: Basic Metabolic Panel:  Recent Labs Lab 12/06/15 0520 12/07/15 0518 12/08/15 0435 12/10/15 0550 12/11/15 0337 12/12/15 0324  NA 143 140 141 141 141 142  K 3.3* 3.6 4.1 3.9 3.7 3.8  CL 110 109 108 108 110 110  CO2 26 25 25 26 26 26   GLUCOSE 145* 144* 139* 134* 101* 94  BUN <5* <5* 8 11 11 10   CREATININE 0.76 0.80 0.84 0.93 0.83 0.97  CALCIUM 8.4* 8.1* 8.5* 8.5* 8.4* 8.5*  MG 2.0 2.1 2.3  --   --   --  PHOS 2.8 3.0 3.6  --   --   --    Liver Function Tests:  Recent Labs Lab 12/06/15 0520 12/08/15 0435  AST 17 20  ALT 20 18  ALKPHOS 60 78  BILITOT 0.6 0.5  PROT 5.3* 5.8*  ALBUMIN 2.4* 2.6*   No results for input(s): LIPASE, AMYLASE in the last 168 hours. No results for input(s): AMMONIA in the last 168 hours. CBC:  Recent Labs Lab 12/06/15 0810 12/08/15 0435 12/10/15 0550 12/11/15 0337 12/12/15 0324  WBC 10.2 8.2 6.6 6.5 7.3  NEUTROABS 8.2*  --   --   --   --   HGB 14.4 14.1 13.8 13.7 13.6  HCT 42.8 42.9 42.6 42.5 40.1  MCV 86.6 88.5 88.8 89.3 85.0  PLT 239 311  309 323 329   Cardiac Enzymes: No results for input(s): CKTOTAL, CKMB, CKMBINDEX, TROPONINI in the last 168 hours. BNP: BNP (last 3 results) No results for input(s): BNP in the last 8760 hours.  ProBNP (last 3 results) No results for input(s): PROBNP in the last 8760 hours.  CBG:  Recent Labs Lab 12/08/15 2351 12/09/15 0755 12/09/15 1557 12/09/15 2358 12/10/15 0739  GLUCAP 95 94 122* 120* 118*       SignedDomenic Polite MD.  [facility] 12/12/2015, 12:12 PM

## 2015-12-12 NOTE — Progress Notes (Signed)
12/12/15 1258: Discharge instructions reviewed with patient and spouse. Questions answered. Prescriptions given. Donne Hazel, RN

## 2015-12-21 ENCOUNTER — Other Ambulatory Visit: Payer: Self-pay | Admitting: Gastroenterology

## 2015-12-21 DIAGNOSIS — K50814 Crohn's disease of both small and large intestine with abscess: Secondary | ICD-10-CM

## 2015-12-22 ENCOUNTER — Ambulatory Visit
Admission: RE | Admit: 2015-12-22 | Discharge: 2015-12-22 | Disposition: A | Discharge status: Home / Self Care | Payer: BLUE CROSS/BLUE SHIELD | Source: Ambulatory Visit | Attending: Gastroenterology | Admitting: Gastroenterology

## 2015-12-22 DIAGNOSIS — K50814 Crohn's disease of both small and large intestine with abscess: Secondary | ICD-10-CM

## 2015-12-22 MED ORDER — IOPAMIDOL (ISOVUE-300) INJECTION 61%: 100.0000 mL | Freq: Once | INTRAVENOUS | Status: DC | PRN

## 2016-01-16 DIAGNOSIS — E538 Deficiency of other specified B group vitamins: Secondary | ICD-10-CM | POA: Diagnosis not present

## 2016-02-06 DIAGNOSIS — I868 Varicose veins of other specified sites: Secondary | ICD-10-CM | POA: Diagnosis not present

## 2016-02-06 DIAGNOSIS — R0989 Other specified symptoms and signs involving the circulatory and respiratory systems: Secondary | ICD-10-CM | POA: Diagnosis not present

## 2016-02-20 DIAGNOSIS — E538 Deficiency of other specified B group vitamins: Secondary | ICD-10-CM | POA: Diagnosis not present

## 2016-03-07 DIAGNOSIS — E291 Testicular hypofunction: Secondary | ICD-10-CM | POA: Diagnosis not present

## 2016-04-23 DIAGNOSIS — H40012 Open angle with borderline findings, low risk, left eye: Secondary | ICD-10-CM | POA: Diagnosis not present

## 2016-04-23 DIAGNOSIS — H40013 Open angle with borderline findings, low risk, bilateral: Secondary | ICD-10-CM | POA: Diagnosis not present

## 2016-04-23 DIAGNOSIS — H2513 Age-related nuclear cataract, bilateral: Secondary | ICD-10-CM | POA: Diagnosis not present

## 2016-04-23 DIAGNOSIS — H40011 Open angle with borderline findings, low risk, right eye: Secondary | ICD-10-CM | POA: Diagnosis not present

## 2016-04-23 DIAGNOSIS — H35371 Puckering of macula, right eye: Secondary | ICD-10-CM | POA: Diagnosis not present

## 2016-04-23 DIAGNOSIS — H43393 Other vitreous opacities, bilateral: Secondary | ICD-10-CM | POA: Diagnosis not present

## 2016-05-09 DIAGNOSIS — K50919 Crohn's disease, unspecified, with unspecified complications: Secondary | ICD-10-CM | POA: Diagnosis not present

## 2016-05-09 DIAGNOSIS — E291 Testicular hypofunction: Secondary | ICD-10-CM | POA: Diagnosis not present

## 2016-05-09 DIAGNOSIS — R899 Unspecified abnormal finding in specimens from other organs, systems and tissues: Secondary | ICD-10-CM | POA: Diagnosis not present

## 2016-05-09 DIAGNOSIS — Z0000 Encounter for general adult medical examination without abnormal findings: Secondary | ICD-10-CM | POA: Diagnosis not present

## 2016-07-02 DIAGNOSIS — Z23 Encounter for immunization: Secondary | ICD-10-CM | POA: Diagnosis not present

## 2016-11-14 DIAGNOSIS — K50013 Crohn's disease of small intestine with fistula: Secondary | ICD-10-CM | POA: Diagnosis not present

## 2017-03-16 DIAGNOSIS — R1084 Generalized abdominal pain: Secondary | ICD-10-CM | POA: Diagnosis not present

## 2017-03-16 DIAGNOSIS — K5090 Crohn's disease, unspecified, without complications: Secondary | ICD-10-CM | POA: Diagnosis not present

## 2017-03-18 ENCOUNTER — Other Ambulatory Visit: Payer: Self-pay | Admitting: Gastroenterology

## 2017-03-18 DIAGNOSIS — K50812 Crohn's disease of both small and large intestine with intestinal obstruction: Secondary | ICD-10-CM

## 2017-03-18 DIAGNOSIS — K56609 Unspecified intestinal obstruction, unspecified as to partial versus complete obstruction: Secondary | ICD-10-CM

## 2017-03-19 ENCOUNTER — Ambulatory Visit
Admission: RE | Admit: 2017-03-19 | Discharge: 2017-03-19 | Disposition: A | Discharge status: Home / Self Care | Payer: BLUE CROSS/BLUE SHIELD | Source: Ambulatory Visit | Attending: Gastroenterology | Admitting: Gastroenterology

## 2017-03-19 DIAGNOSIS — K50812 Crohn's disease of both small and large intestine with intestinal obstruction: Secondary | ICD-10-CM

## 2017-03-19 DIAGNOSIS — R109 Unspecified abdominal pain: Secondary | ICD-10-CM | POA: Diagnosis not present

## 2017-03-19 MED ORDER — IOPAMIDOL (ISOVUE-300) INJECTION 61%
100.0000 mL | Freq: Once | INTRAVENOUS | Status: AC | PRN
  Administered 2017-03-19: 100 mL via INTRAVENOUS

## 2017-03-20 DIAGNOSIS — K50019 Crohn's disease of small intestine with unspecified complications: Secondary | ICD-10-CM | POA: Diagnosis not present

## 2017-04-04 DIAGNOSIS — R350 Frequency of micturition: Secondary | ICD-10-CM | POA: Diagnosis not present

## 2017-04-06 ENCOUNTER — Encounter (HOSPITAL_COMMUNITY): Payer: Self-pay

## 2017-04-06 ENCOUNTER — Inpatient Hospital Stay (HOSPITAL_COMMUNITY)
Admission: EM | Admit: 2017-04-06 | Discharge: 2017-04-18 | DRG: 853 | Disposition: A | Discharge status: Home / Self Care | Payer: BLUE CROSS/BLUE SHIELD | Attending: Internal Medicine | Admitting: Internal Medicine

## 2017-04-06 ENCOUNTER — Emergency Department (HOSPITAL_COMMUNITY): Payer: BLUE CROSS/BLUE SHIELD

## 2017-04-06 DIAGNOSIS — E291 Testicular hypofunction: Secondary | ICD-10-CM | POA: Diagnosis present

## 2017-04-06 DIAGNOSIS — R1011 Right upper quadrant pain: Secondary | ICD-10-CM | POA: Diagnosis not present

## 2017-04-06 DIAGNOSIS — K50014 Crohn's disease of small intestine with abscess: Secondary | ICD-10-CM | POA: Diagnosis not present

## 2017-04-06 DIAGNOSIS — Z9109 Other allergy status, other than to drugs and biological substances: Secondary | ICD-10-CM | POA: Diagnosis not present

## 2017-04-06 DIAGNOSIS — K5010 Crohn's disease of large intestine without complications: Secondary | ICD-10-CM | POA: Diagnosis not present

## 2017-04-06 DIAGNOSIS — D72828 Other elevated white blood cell count: Secondary | ICD-10-CM | POA: Insufficient documentation

## 2017-04-06 DIAGNOSIS — R1010 Upper abdominal pain, unspecified: Secondary | ICD-10-CM | POA: Diagnosis not present

## 2017-04-06 DIAGNOSIS — K50013 Crohn's disease of small intestine with fistula: Secondary | ICD-10-CM | POA: Diagnosis present

## 2017-04-06 DIAGNOSIS — B955 Unspecified streptococcus as the cause of diseases classified elsewhere: Secondary | ICD-10-CM | POA: Diagnosis not present

## 2017-04-06 DIAGNOSIS — K50114 Crohn's disease of large intestine with abscess: Secondary | ICD-10-CM | POA: Diagnosis not present

## 2017-04-06 DIAGNOSIS — Z87891 Personal history of nicotine dependence: Secondary | ICD-10-CM | POA: Diagnosis not present

## 2017-04-06 DIAGNOSIS — M461 Sacroiliitis, not elsewhere classified: Secondary | ICD-10-CM | POA: Diagnosis not present

## 2017-04-06 DIAGNOSIS — E8809 Other disorders of plasma-protein metabolism, not elsewhere classified: Secondary | ICD-10-CM | POA: Diagnosis not present

## 2017-04-06 DIAGNOSIS — K561 Intussusception: Secondary | ICD-10-CM | POA: Diagnosis not present

## 2017-04-06 DIAGNOSIS — D729 Disorder of white blood cells, unspecified: Secondary | ICD-10-CM | POA: Insufficient documentation

## 2017-04-06 DIAGNOSIS — K219 Gastro-esophageal reflux disease without esophagitis: Secondary | ICD-10-CM | POA: Diagnosis not present

## 2017-04-06 DIAGNOSIS — K651 Peritoneal abscess: Secondary | ICD-10-CM | POA: Diagnosis not present

## 2017-04-06 DIAGNOSIS — K210 Gastro-esophageal reflux disease with esophagitis: Secondary | ICD-10-CM | POA: Diagnosis not present

## 2017-04-06 DIAGNOSIS — K5090 Crohn's disease, unspecified, without complications: Secondary | ICD-10-CM | POA: Diagnosis not present

## 2017-04-06 DIAGNOSIS — F1010 Alcohol abuse, uncomplicated: Secondary | ICD-10-CM | POA: Diagnosis not present

## 2017-04-06 DIAGNOSIS — R911 Solitary pulmonary nodule: Secondary | ICD-10-CM | POA: Diagnosis not present

## 2017-04-06 DIAGNOSIS — K567 Ileus, unspecified: Secondary | ICD-10-CM | POA: Diagnosis not present

## 2017-04-06 DIAGNOSIS — E876 Hypokalemia: Secondary | ICD-10-CM | POA: Diagnosis not present

## 2017-04-06 DIAGNOSIS — B009 Herpesviral infection, unspecified: Secondary | ICD-10-CM | POA: Diagnosis present

## 2017-04-06 DIAGNOSIS — Z7982 Long term (current) use of aspirin: Secondary | ICD-10-CM | POA: Diagnosis not present

## 2017-04-06 DIAGNOSIS — K631 Perforation of intestine (nontraumatic): Secondary | ICD-10-CM | POA: Diagnosis not present

## 2017-04-06 DIAGNOSIS — K630 Abscess of intestine: Secondary | ICD-10-CM | POA: Diagnosis not present

## 2017-04-06 DIAGNOSIS — Z79899 Other long term (current) drug therapy: Secondary | ICD-10-CM | POA: Diagnosis not present

## 2017-04-06 DIAGNOSIS — A419 Sepsis, unspecified organism: Principal | ICD-10-CM | POA: Diagnosis present

## 2017-04-06 DIAGNOSIS — R109 Unspecified abdominal pain: Secondary | ICD-10-CM | POA: Diagnosis not present

## 2017-04-06 DIAGNOSIS — K5000 Crohn's disease of small intestine without complications: Secondary | ICD-10-CM | POA: Diagnosis not present

## 2017-04-06 DIAGNOSIS — K529 Noninfective gastroenteritis and colitis, unspecified: Secondary | ICD-10-CM | POA: Diagnosis not present

## 2017-04-06 DIAGNOSIS — K50914 Crohn's disease, unspecified, with abscess: Secondary | ICD-10-CM | POA: Diagnosis not present

## 2017-04-06 DIAGNOSIS — K50018 Crohn's disease of small intestine with other complication: Secondary | ICD-10-CM | POA: Diagnosis not present

## 2017-04-06 DIAGNOSIS — D72829 Elevated white blood cell count, unspecified: Secondary | ICD-10-CM | POA: Diagnosis not present

## 2017-04-06 LAB — COMPREHENSIVE METABOLIC PANEL
ALT: 24 U/L (ref 17–63)
AST: 24 U/L (ref 15–41)
Albumin: 2.8 g/dL — ABNORMAL LOW (ref 3.5–5.0)
Alkaline Phosphatase: 118 U/L (ref 38–126)
Anion gap: 9 (ref 5–15)
BUN: 8 mg/dL (ref 6–20)
CO2: 25 mmol/L (ref 22–32)
Calcium: 8.9 mg/dL (ref 8.9–10.3)
Chloride: 105 mmol/L (ref 101–111)
Creatinine, Ser: 0.87 mg/dL (ref 0.61–1.24)
GFR calc Af Amer: >60 mL/min (ref >60)
GFR calc non Af Amer: >60 mL/min (ref >60)
Glucose, Bld: 121 mg/dL — ABNORMAL HIGH (ref 65–99)
Potassium: 3.7 mmol/L (ref 3.5–5.1)
Sodium: 139 mmol/L (ref 135–145)
Total Bilirubin: 0.5 mg/dL (ref 0.3–1.2)
Total Protein: 6.7 g/dL (ref 6.5–8.1)

## 2017-04-06 LAB — CBC WITH DIFFERENTIAL/PLATELET
Basophils Absolute: 0 10*3/uL (ref 0.0–0.1)
Basophils Relative: 0 %
Eosinophils Absolute: 0.3 10*3/uL (ref 0.0–0.7)
Eosinophils Relative: 3 %
HCT: 40.7 % (ref 39.0–52.0)
Hemoglobin: 13.6 g/dL (ref 13.0–17.0)
Lymphocytes Relative: 14 %
Lymphs Abs: 1.7 10*3/uL (ref 0.7–4.0)
MCH: 27.9 pg (ref 26.0–34.0)
MCHC: 33.4 g/dL (ref 30.0–36.0)
MCV: 83.6 fL (ref 78.0–100.0)
Monocytes Absolute: 1 10*3/uL (ref 0.1–1.0)
Monocytes Relative: 8 %
Neutro Abs: 8.9 10*3/uL — ABNORMAL HIGH (ref 1.7–7.7)
Neutrophils Relative %: 75 %
Platelets: 362 10*3/uL (ref 150–400)
RBC: 4.87 MIL/uL (ref 4.22–5.81)
RDW: 13.1 % (ref 11.5–15.5)
WBC: 11.9 10*3/uL — ABNORMAL HIGH (ref 4.0–10.5)

## 2017-04-06 LAB — URINALYSIS, ROUTINE W REFLEX MICROSCOPIC
Bilirubin Urine: NEGATIVE
Glucose, UA: NEGATIVE mg/dL
Hgb urine dipstick: NEGATIVE
Ketones, ur: NEGATIVE mg/dL
Leukocytes, UA: NEGATIVE
Nitrite: NEGATIVE
Protein, ur: NEGATIVE mg/dL
Specific Gravity, Urine: 1.012 (ref 1.005–1.030)
pH: 7 (ref 5.0–8.0)

## 2017-04-06 LAB — LIPASE, BLOOD: Lipase: 20 U/L (ref 11–51)

## 2017-04-06 MED ORDER — FLUTICASONE PROPIONATE 50 MCG/ACT NA SUSP
1.0000 | Freq: Every day | NASAL | Status: DC | PRN
Start: 2017-04-06 — End: 2017-04-18
  Filled 2017-04-06: qty 16

## 2017-04-06 MED ORDER — PIPERACILLIN-TAZOBACTAM 3.375 G IVPB
3.3750 g | Freq: Three times a day (TID) | INTRAVENOUS | Status: DC
  Administered 2017-04-07 – 2017-04-11 (×13): 3.375 g via INTRAVENOUS
  Filled 2017-04-06 (×14): qty 50

## 2017-04-06 MED ORDER — TESTOSTERONE 50 MG/5GM (1%) TD GEL
5.0000 g | Freq: Every day | TRANSDERMAL | Status: DC
  Administered 2017-04-07: 5 g via TRANSDERMAL
  Filled 2017-04-06 (×2): qty 5

## 2017-04-06 MED ORDER — VALACYCLOVIR HCL 500 MG PO TABS
500.0000 mg | ORAL_TABLET | Freq: Every day | ORAL | Status: DC
  Administered 2017-04-07: 500 mg via ORAL
  Filled 2017-04-06 (×6): qty 1

## 2017-04-06 MED ORDER — HYDROCODONE-ACETAMINOPHEN 5-325 MG PO TABS
1.0000 | ORAL_TABLET | Freq: Four times a day (QID) | ORAL | Status: DC | PRN
  Administered 2017-04-07 – 2017-04-09 (×9): 2 via ORAL
  Filled 2017-04-06 (×10): qty 2

## 2017-04-06 MED ORDER — VITAMIN D 1000 UNITS PO TABS
2000.0000 [IU] | ORAL_TABLET | Freq: Every day | ORAL | Status: DC
  Administered 2017-04-07 – 2017-04-17 (×4): 2000 [IU] via ORAL
  Filled 2017-04-06 (×9): qty 2

## 2017-04-06 MED ORDER — IOPAMIDOL (ISOVUE-300) INJECTION 61%
100.0000 mL | Freq: Once | INTRAVENOUS | Status: AC | PRN
  Administered 2017-04-06: 100 mL via INTRAVENOUS

## 2017-04-06 MED ORDER — IOPAMIDOL (ISOVUE-300) INJECTION 61%
INTRAVENOUS | Status: AC
  Filled 2017-04-06: qty 30

## 2017-04-06 MED ORDER — BISACODYL 5 MG PO TBEC: 5.0000 mg | DELAYED_RELEASE_TABLET | Freq: Every day | ORAL | Status: DC | PRN

## 2017-04-06 MED ORDER — ONDANSETRON HCL 4 MG/2ML IJ SOLN
4.0000 mg | Freq: Four times a day (QID) | INTRAMUSCULAR | Status: DC | PRN
  Administered 2017-04-07 – 2017-04-15 (×5): 4 mg via INTRAVENOUS
  Filled 2017-04-06 (×5): qty 2

## 2017-04-06 MED ORDER — ORAL CARE MOUTH RINSE
15.0000 mL | Freq: Two times a day (BID) | OROMUCOSAL | Status: DC
  Administered 2017-04-12 – 2017-04-15 (×5): 15 mL via OROMUCOSAL

## 2017-04-06 MED ORDER — AZATHIOPRINE 50 MG PO TABS
100.0000 mg | ORAL_TABLET | Freq: Every day | ORAL | Status: DC
  Administered 2017-04-07 – 2017-04-17 (×5): 100 mg via ORAL
  Filled 2017-04-06 (×12): qty 2

## 2017-04-06 MED ORDER — MORPHINE SULFATE (PF) 2 MG/ML IV SOLN
4.0000 mg | Freq: Once | INTRAVENOUS | Status: AC
  Administered 2017-04-06: 4 mg via INTRAVENOUS
  Filled 2017-04-06: qty 2

## 2017-04-06 MED ORDER — CHLORHEXIDINE GLUCONATE 0.12 % MT SOLN
15.0000 mL | Freq: Two times a day (BID) | OROMUCOSAL | Status: DC
  Administered 2017-04-07: 15 mL via OROMUCOSAL
  Filled 2017-04-06: qty 15

## 2017-04-06 MED ORDER — FAMOTIDINE IN NACL 20-0.9 MG/50ML-% IV SOLN
20.0000 mg | Freq: Once | INTRAVENOUS | Status: AC
  Administered 2017-04-06: 20 mg via INTRAVENOUS
  Filled 2017-04-06: qty 50

## 2017-04-06 MED ORDER — ONDANSETRON HCL 4 MG PO TABS: 4.0000 mg | ORAL_TABLET | Freq: Four times a day (QID) | ORAL | Status: DC | PRN

## 2017-04-06 MED ORDER — PANTOPRAZOLE SODIUM 40 MG PO TBEC
40.0000 mg | DELAYED_RELEASE_TABLET | Freq: Every day | ORAL | Status: DC
  Administered 2017-04-07: 40 mg via ORAL
  Filled 2017-04-06: qty 1

## 2017-04-06 MED ORDER — PIPERACILLIN-TAZOBACTAM 3.375 G IVPB 30 MIN
3.3750 g | Freq: Once | INTRAVENOUS | Status: AC
  Administered 2017-04-06: 3.375 g via INTRAVENOUS
  Filled 2017-04-06: qty 50

## 2017-04-06 MED ORDER — SODIUM CHLORIDE 0.9 % IV SOLN
INTRAVENOUS | Status: DC
  Administered 2017-04-06 – 2017-04-07 (×2): via INTRAVENOUS

## 2017-04-06 MED ORDER — SODIUM CHLORIDE 0.9 % IV BOLUS (SEPSIS)
1000.0000 mL | Freq: Once | INTRAVENOUS | Status: AC
  Administered 2017-04-06: 1000 mL via INTRAVENOUS

## 2017-04-06 MED ORDER — OMEGA-3-ACID ETHYL ESTERS 1 G PO CAPS
1000.0000 mg | ORAL_CAPSULE | Freq: Every day | ORAL | Status: DC
  Administered 2017-04-07: 1000 mg via ORAL
  Filled 2017-04-06 (×7): qty 1

## 2017-04-06 MED ORDER — MORPHINE SULFATE (PF) 4 MG/ML IV SOLN
1.0000 mg | INTRAVENOUS | Status: DC | PRN
  Administered 2017-04-07 – 2017-04-08 (×6): 2 mg via INTRAVENOUS
  Filled 2017-04-06 (×6): qty 1

## 2017-04-06 MED ORDER — POLYETHYLENE GLYCOL 3350 17 G PO PACK: 17.0000 g | PACK | Freq: Every day | ORAL | Status: DC | PRN

## 2017-04-06 MED ORDER — ENOXAPARIN SODIUM 40 MG/0.4ML ~~LOC~~ SOLN
40.0000 mg | Freq: Every day | SUBCUTANEOUS | Status: DC
  Administered 2017-04-06 – 2017-04-10 (×5): 40 mg via SUBCUTANEOUS
  Filled 2017-04-06 (×5): qty 0.4

## 2017-04-06 MED ORDER — LORATADINE 10 MG PO TABS
10.0000 mg | ORAL_TABLET | Freq: Every day | ORAL | Status: DC
  Administered 2017-04-07: 10 mg via ORAL
  Filled 2017-04-06 (×6): qty 1

## 2017-04-06 MED ORDER — IOPAMIDOL (ISOVUE-300) INJECTION 61%
INTRAVENOUS | Status: AC
  Filled 2017-04-06: qty 100

## 2017-04-06 NOTE — Progress Notes (Signed)
Pharmacy Antibiotic Note  Nathan Shaw is a 61 y.o. male admitted on 04/06/2017 with Intra-abdominal infection.  Pharmacy has been consulted for zosyn dosing.  Plan: Zosyn 3.375g IV q8h (4 hour infusion).     Temp (24hrs), Avg:98.2 F (36.8 C), Min:98.2 F (36.8 C), Max:98.2 F (36.8 C)   Recent Labs Lab 04/06/17 1756 04/06/17 1757  WBC  --  11.9*  CREATININE 0.87  --     CrCl cannot be calculated (Unknown ideal weight.).    No Known Allergies  Antimicrobials this admission:  Zosyn 04/06/2017 >>   Dose adjustments this admission: -  Microbiology results: pending  Thank you for allowing pharmacy to be a part of this patient's care.  [name] 04/06/2017 11:55 PM

## 2017-04-06 NOTE — ED Notes (Signed)
Per MD order pt advised U/A needed. Pt states unable to provide specimen at this time. Urinal placed at bedside, advised pt to use call bell to ask for assistance using restroom as needed. [employer]

## 2017-04-06 NOTE — ED Provider Notes (Signed)
[facility] DEPT Provider Note   CSN: [id number] Arrival date & time: 04/06/17  1655     History   Chief Complaint Chief Complaint  Patient presents with  . Abdominal Pain    HPI Nathan Shaw is a 61 y.o. male with a PMHx of crohn's disease, prostatitis, GERD, and prior ileitis with perforation managed medically with cipro/flagyl (during hospitalization in 11/2015), who presents to the ED with complaints of sudden onset upper abdominal cramping that began at 1:30 PM. Patient states that he had eaten pizza for lunch, went out and mowed the grass, came in and drinks some Gatorade and then the symptoms started. He states that usually his abdominal cramps resolve quickly however these continued to persist which is what prompted him to come to the emergency room. He describes his pain as 8/10 constant cramping nonradiating upper abdominal pain that worsens with lying flat and has been unrelieved with hydrocodone. He has chronic RLQ pain that waxes/wanes, and today it's the same as it always is-- states that it usually happens with "gas". He reports that on 03/19/17 he had a CT scan of his belly that showed active Crohn's and a partial small bowel obstruction, he was started on mercaptopurine at that time and he feels like he has been doing better. He is also on humira. His GI specialist is Dr. Therisa Doyne with [facility]. Earlier this week on Thursday he saw his PCP Dr. Hulan Fess [facility]) for some low-grade fevers, he was diagnosed with prostatitis and was started on Cipro Thursday night, and states that his fevers resolved Friday and he has not had any ongoing fevers. He has not had any difficulty with the Cipro course. He reports that he had a bowel movement around 4 PM which was his second bowel movement of the day, and it was normal for him. He endorses alcohol use stating that he has to 3 shots of liquor per day on most days. He also reports NSAID use once daily about 3-4 times per week.   He denies  ongoing fevers, chills, CP, SOB, nausea/vomiting, diarrhea/constipation, obstipation, melena, hematochezia, rectal pain, hematuria, dysuria, urinary frequency, testicular pain/swelling, penile discharge, myalgias, arthralgias, numbness, tingling, focal weakness, or any other complaints at this time. Denies recent travel, sick contacts, suspicious food intake, or prior abd surgeries.    The history is provided by the patient and medical records. No language interpreter was used.  Abdominal Pain   This is a recurrent problem. The current episode started 3 to 5 hours ago. The problem occurs constantly. The problem has been gradually worsening. The pain is associated with an unknown factor. The pain is located in the RUQ and LUQ. The quality of the pain is cramping. The pain is at a severity of 8/10. The pain is moderate. Pertinent negatives include fever (resolved), diarrhea, flatus, hematochezia, melena, nausea, vomiting, constipation, dysuria, frequency, hematuria, arthralgias and myalgias. The symptoms are aggravated by certain positions. Nothing relieves the symptoms. Past workup includes CT scan. Past workup does not include surgery. His past medical history is significant for GERD and Crohn's disease.    Past Medical History:  Diagnosis Date  . Crohn disease ([place]) last flare up dec 2014/ jan 2015  . Prostatitis   . Wears contact lenses     Patient Active Problem List   Diagnosis Date Noted  . Crohn's disease of large intestine with abscess ([place])   . GERD (gastroesophageal reflux disease) 12/03/2015  . Thrombocytopenia ([place]) 12/03/2015  . 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 Ileal perforation from Crohn's ileitis 11/30/2015    Past Surgical History:  Procedure Laterality Date  . CARPAL TUNNEL RELEASE Right 08/05/2014   Procedure: RIGHT CARPAL TUNNEL RELEASE;  Surgeon: Daryll Brod, MD;  Location: [place] [facility];  Service: Orthopedics;  Laterality: Right;  . COLONOSCOPY WITH  PROPOFOL N/A 02/16/2014   Procedure: COLONOSCOPY WITH PROPOFOL;  Surgeon: Garlan Fair, MD;  Location: [facility];  Service: Endoscopy;  Laterality: N/A;  . LUNG BIOPSY  1997    was infection       Home Medications    Prior to Admission medications   Medication Sig Start Date End Date Taking? Authorizing Provider  aspirin EC 81 MG tablet Take 81 mg by mouth daily.    [provider]  calcium carbonate (OS-CAL) 600 MG TABS tablet Take 600 mg by mouth daily with breakfast.    [provider]  cetirizine (ZYRTEC) 10 MG tablet Take 10 mg by mouth daily.    [provider]  cholecalciferol (VITAMIN D) 1000 UNITS tablet Take 1,000 Units by mouth daily.    [provider]  ciprofloxacin (CIPRO) 500 MG tablet Take 1 tablet (500 mg total) by mouth 2 (two) times daily. For 10days 12/12/15   Domenic Polite, MD  Cyanocobalamin (VITAMIN B-12 IJ) Inject as directed every 30 (thirty) days.    [provider]  fluticasone (FLONASE) 50 MCG/ACT nasal spray Place 1 spray into both nostrils daily.    [provider]  HYDROcodone-acetaminophen (NORCO) 5-325 MG tablet Take 1-2 tablets by mouth every 6 (six) hours as needed for moderate pain or severe pain. 12/12/15   Domenic Polite, MD  metroNIDAZOLE (FLAGYL) 500 MG tablet Take 1 tablet (500 mg total) by mouth every 8 (eight) hours. For 10days 12/12/15   Domenic Polite, MD  Misc Natural Products (GLUCOS-CHONDROIT-MSM COMPLEX PO) Take 1 tablet by mouth daily.     [provider]  Multiple Vitamin (MULTIVITAMIN WITH MINERALS) TABS tablet Take 1 tablet by mouth daily.    [provider]  omeprazole (PRILOSEC) 20 MG capsule Take 20 mg by mouth daily.    [provider]  ondansetron (ZOFRAN-ODT) 4 MG disintegrating tablet Take 1 tablet (4 mg total) by mouth every 8 (eight) hours as needed for nausea or vomiting. 12/12/15   Domenic Polite, MD  saccharomyces boulardii (FLORASTOR) 250  MG capsule Take 1 capsule (250 mg total) by mouth 2 (two) times daily. For 10days 12/12/15   Domenic Polite, MD  sildenafil (REVATIO) 20 MG tablet Take 20 mg by mouth daily as needed (erectile dysfunction).    [provider]  testosterone (ANDROGEL) 50 MG/5GM (1%) GEL Place 5 g onto the skin daily.    [provider]  UNABLE TO FIND This note is to excuse Mr.Jasman Bochenek from work due to medical illness requiring Hospitalization from 11/30/15-12/12/15, ok to return to work by 12/19/15 12/12/15   Domenic Polite, MD    Family History Family History  Problem Relation Age of Onset  . Crohn's disease Sister   . Hypertension Neg Hx   . Diabetes Mellitus II Neg Hx     Social History Social History  Substance Use Topics  . Smoking status: Former Smoker    Packs/day: 1.50    Years: 18.00    Quit date: 10/02/2003  . Smokeless tobacco: Never Used  . Alcohol use Yes     Comment: daily 2-3 ounce per day     Allergies  Patient has no known allergies.   Review of Systems Review of Systems  Constitutional: Negative for chills and fever (resolved).  Respiratory: Negative for shortness of breath.   Cardiovascular: Negative for chest pain.  Gastrointestinal: Positive for abdominal pain. Negative for anal bleeding, blood in stool, constipation, diarrhea, flatus, hematochezia, melena, nausea, rectal pain and vomiting.  Genitourinary: Negative for discharge, dysuria, frequency, hematuria, scrotal swelling and testicular pain.  Musculoskeletal: Negative for arthralgias and myalgias.  Skin: Negative for color change.  Allergic/Immunologic: Positive for immunocompromised state (on Humira).  Neurological: Negative for weakness and numbness.  Psychiatric/Behavioral: Negative for confusion.   All other systems reviewed and are negative for acute change except as noted in the HPI.    Physical Exam Updated Vital Signs BP 136/89 (BP Location: Right Arm)   Pulse 85   Temp 98.2 F  (36.8 C) (Oral)   Resp 16   SpO2 100%   Physical Exam  Constitutional: He is oriented to person, place, and time. Vital signs are normal. He appears well-developed and well-nourished.  Non-toxic appearance. No distress.  Afebrile, nontoxic, NAD  HENT:  Head: Normocephalic and atraumatic.  Mouth/Throat: Oropharynx is clear and moist and mucous membranes are normal.  Eyes: Conjunctivae and EOM are normal. Right eye exhibits no discharge. Left eye exhibits no discharge.  Neck: Normal range of motion. Neck supple.  Cardiovascular: Normal rate, regular rhythm, normal heart sounds and intact distal pulses.  Exam reveals no gallop and no friction rub.   No murmur heard. Pulmonary/Chest: Effort normal and breath sounds normal. No respiratory distress. He has no decreased breath sounds. He has no wheezes. He has no rhonchi. He has no rales.  Abdominal: Soft. Normal appearance. He exhibits distension. Bowel sounds are decreased. There is tenderness in the right upper quadrant, right lower quadrant and epigastric area. There is guarding. There is no rigidity, no rebound, no CVA tenderness, no tenderness at McBurney's point and negative Murphy's sign.  Soft abdomen, although appears slightly distended, slightly hyperactive BS in LUQ with hypoactive BS in all other quadrants, with exquisite RUQ/epigastric and RLQ TTP, some voluntary guarding, no rigidity or rebound tenderness, neg murphy's, no focal mcburney's point TTP although RLQ TTP near mcburney's point, no CVA TTP   Musculoskeletal: Normal range of motion.  Neurological: He is alert and oriented to person, place, and time. He has normal strength. No sensory deficit.  Skin: Skin is warm, dry and intact. No rash noted.  Psychiatric: He has a normal mood and affect.  Nursing note and vitals reviewed.    ED Treatments / Results  Labs (all labs ordered are listed, but only abnormal results are displayed) Labs Reviewed  COMPREHENSIVE METABOLIC PANEL  - Abnormal; Notable for the following:       Result Value   Glucose, Bld 121 (*)    Albumin 2.8 (*)    All other components within normal limits  CBC WITH DIFFERENTIAL/PLATELET - Abnormal; Notable for the following:    WBC 11.9 (*)    Neutro Abs 8.9 (*)    All other components within normal limits  LIPASE, BLOOD  URINALYSIS, ROUTINE W REFLEX MICROSCOPIC    EKG  EKG Interpretation None       Radiology Ct Abdomen Pelvis W Contrast  Result Date: 04/06/2017 CLINICAL DATA:  Crohn's disease with abdominal pain EXAM: CT ABDOMEN AND PELVIS WITH CONTRAST TECHNIQUE: Multidetector CT imaging of the abdomen and pelvis was performed using the standard protocol following bolus administration of intravenous contrast. Oral  contrast was also administered. CONTRAST:  <See Chart> ISOVUE-300 IOPAMIDOL (ISOVUE-300) INJECTION 61% COMPARISON:  March 19, 2017 as well as multiple prior studies FINDINGS: Lower chest: On axial slice 6 series 6, there is a stable 2 mm nodular opacity in the posterior segment of the left lobe of the liver. Lung bases otherwise are clear. Hepatobiliary: No focal liver lesions are evident. Gallbladder wall is not appreciably thickened. There is no biliary duct dilatation. Pancreas: No pancreatic mass or inflammatory focus. Spleen: Spleen is upper normal in size. No splenic lesions are evident. Adrenals/Urinary Tract: Adrenals appear normal bilaterally. There is a 7 mm cyst in the mid left kidney. There is no hydronephrosis on either side. There is no renal or ureteral calculus on either side. Urinary bladder is midline with wall thickness within normal limits. Stomach/Bowel: There is bowel wall thickening throughout most of the ileum including the terminal ileum consistent with known Crohn's disease. There is a fistula between the distal ileum and mesentery in the right upper to mid pelvic region with focal areas of air consistent with mesenteric abscess in this area measuring 4.9 x 4.7 x 3.4  cm. This mesenteric abscess is larger than on recent prior study. No other abscess is seen. There is mesenteric stranding along much of the ileum, particularly in the right abdomen. There is mild loculated fluid in this area. There is enhancement of bowel wall in the mid to distal ileum, consistent with acute inflammation. In the proximal ileal region, there is localized bowel dilatation with attempted intussusception. Oral contrast does pass through this area. More proximally, fat is noted in the wall of the duodenum. No bowel inflammation is noted proximal to the ileum. There is no frank bowel obstruction. There is no free air or portal venous air beyond the air which is seen within the area of the mesenteric abscess at the site of the fistula with a distal small bowel. Vascular/Lymphatic: There is atherosclerotic calcification in the aorta and iliac arteries. No evident aneurysm. Major mesenteric vessels appear patent. No adenopathy evident in the abdomen or pelvis. Reproductive: Prostate and seminal vesicles appear normal in size and contour. No pelvic mass beyond bowel inflammation. Other: There is mild loculated ascites in the dependent portion of the pelvis. Appendix not seen. Appendix may well be involved with inflammation from the terminal ileitis and nearby mesenteric abscess. Musculoskeletal: There is chronic anterior wedging of the L2 vertebral body. There are no blastic or lytic bone lesions. There is symmetric sacroiliitis consistent with the Crohn's disease. No blastic or lytic bone lesions. No intramuscular or abdominal wall lesion evident. IMPRESSION: 1. Extensive ileitis. 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 There is considerably more inflammation in the right lower quadrant involvement of the mid and distal ileum than on recent prior study. Attempted intussusception more proximally in  the ileum without obstruction. 2. Fat in the duodenum wall, likely due to an active residua of Crohn's disease. 3. No inflammatory change noted in bowel proximal to the ileum currently. 4. No renal or ureteral calculus. No hydronephrosis. No biliary duct dilatation. 5.  Stable symmetric sacroiliitis consistent with Crohn's disease. 6.  Aortic atherosclerosis. 7. 2 mm nodular opacity posterior left base. No follow-up needed if patient is low-risk. Non-contrast chest CT can be considered in 12 months if patient is high-risk. This recommendation follows the consensus statement: Guidelines for Management of Incidental Pulmonary Nodules Detected on  CT Images: From the Fleischner Society 2017; Radiology 2017; [id number]. Aortic Atherosclerosis (ICD10-I70.0). Electronically Signed   By: Lowella Grip III M.D.   On: 04/06/2017 20:27    CT entero Abd/pelv with contrast 03/19/17: Study Result  CLINICAL DATA:  Increasing abdominal cramping with vomiting and fever. Recently started antibiotics. History of Crohn's disease with bowel obstruction.  EXAM: CT ABDOMEN AND PELVIS WITH CONTRAST (ENTEROGRAPHY)  TECHNIQUE: Multidetector CT of the abdomen and pelvis during bolus administration of intravenous contrast. Negative oral contrast was given.  CONTRAST:  159mL ISOVUE-300 IOPAMIDOL (ISOVUE-300) INJECTION 61%  COMPARISON:  CT 12/22/2015 and 12/05/2015.  Radiographs 03/16/2017.  FINDINGS: Lower chest: Clear lung bases. No significant pleural or pericardial effusion.  Hepatobiliary: Stable probable tiny subcapsular cyst in the dome of the right hepatic lobe, best seen on images 17 and 18 of series 3. No suspicious hepatic findings. No evidence of gallstones, gallbladder wall thickening or biliary dilatation.  Pancreas: Unremarkable. No pancreatic ductal dilatation or surrounding inflammatory changes.  Spleen: Normal in size without focal abnormality.  Adrenals/Urinary Tract: Both  adrenal glands appear normal. Stable ill-defined low-density lesions in both kidneys, too small to characterize. No evidence of urinary tract calculus or hydronephrosis. The bladder appears normal.  Stomach/Bowel: The stomach and proximal small bowel demonstrate no acute findings. There is mild fat deposition within the duodenal wall. Again demonstrated is long segment inflammation of the terminal ileum with wall thickening, mucosal hyper enhancement and surrounding inflammation. The overall extent of this bowel inflammation is similar to the prior study. However, the surrounding inflammatory changes have significantly improved, and there is no longer any residual fluid collection. There is a tiny dot of extraluminal air anterior to the terminal ileum (image 139 of series 3). The small bowel proximal to this inflammation is dilated to 4.9 cm, consistent with partial obstruction. There are suspected skip lesions with more proximal involvement of the ileum in the right mid abdomen (coronal images 40-49), not clearly seen previously. No colonic abnormality or fistula identified.  Vascular/Lymphatic: There are no enlarged abdominal or pelvic lymph nodes. Mild aortoiliac atherosclerosis.  Reproductive: The prostate gland and seminal vesicles appear unremarkable.  Other: No evidence of abdominal wall mass or hernia. No ascites or free air.  Musculoskeletal: No acute osseous findings. Suspected mild chronic sacroiliitis bilaterally.  IMPRESSION: 1. Evidence of persistent active Crohn's disease involving the terminal ileum with resulting partial small bowel obstruction. Probable skip lesion involving proximal ileum. 2. The previously demonstrated pericolonic inflammatory changes from focal perforation have significantly improved. No residual focal extraluminal fluid collection. 3.  Aortic Atherosclerosis (ICD10-I70.0).   Electronically Signed   By: Richardean Sale M.D.    On: 03/19/2017 15:49     Procedures Procedures (including critical care time)  Medications Ordered in ED Medications  iopamidol (ISOVUE-300) 61 % injection (not administered)  piperacillin-tazobactam (ZOSYN) IVPB 3.375 g (3.375 g Intravenous New Bag/Given 04/06/17 2116)  sodium chloride 0.9 % bolus 1,000 mL (0 mLs Intravenous Stopped 04/06/17 1912)  morphine 2 MG/ML injection 4 mg (4 mg Intravenous Given 04/06/17 1801)  famotidine (PEPCID) IVPB 20 mg premix (0 mg Intravenous Stopped 04/06/17 1842)  iopamidol (ISOVUE-300) 61 % injection 100 mL (100 mLs Intravenous Contrast Given 04/06/17 2001)  morphine 2 MG/ML injection 4 mg (4 mg Intravenous Given 04/06/17 2120)     Initial Impression / Assessment and Plan / ED Course  I have reviewed the triage vital signs and the nursing notes.  Pertinent labs & imaging results that were available  during my care of the patient were reviewed by me and considered in my medical decision making (see chart for details).     62 y.o. male here with upper abd cramping that started ~4hrs ago, hasn't improved since then so he came in for evaluation. Pt has hx of crohn's, just had CT abd/pelv 03/19/17 that showed active crohn's involving terminal ileum with resulting partial SBO, skip lesion at proximal ileum, and resolved pericolic inflammation from prior perforation with no ongoing fluid collection. He was started on mercaptopurine 2wks ago, and is on Humira for his crohn's. Was also just started on cipro 2 days ago for presumed prostatitis and states his prior low grade fevers resolved yesterday. On exam today, exquisite RUQ and RLQ TTP, +guarding, slightly distended, with hypoactive bowel sounds in RUQ/RLQ/LLQ but hyperactive sounds in LUQ. Given amount of pain and guarding, will obtain labs and CT abd/pelv to eval for obstruction/perf/colitis/etc. Will give pain meds and pepcid with fluids. Pt denies n/v, declines wanting anything else at this time. Will reassess shortly.     9:16 PM Pt stating the pain is returning, however morphine earlier had helped. Will reorder another dose. CBC w/diff showing neutrophilic leukocytosis. CMP with hypoalbuminemia but otherwise unremarkable. Lipase WNL. U/A WNL. CT showing extensive ileitis with fistula between distal ileum and mesentery and abscess formation in the RLQ; increased RLQ inflammation compared to prior study, and attempted intussusception more proximally from bowel dilation, however contrast passes through and no obstruction found. Other nonemergent/nonacute findings also seen. Will consult GI, and likely surgeons as well, and most likely admit pt. Will start zosyn for now until I speak with consultants. Discussed case with my attending Dr. Lita Mains who agrees with plan.   9:38 PM Dr. Penelope Coop of [facility] returning page and will consult on pt, agrees with zosyn for broad spec coverage, no corticosteroids for now. Dr. Harlow Asa of gen surgery returning page and will consult on pt while he's admitted. Dr. Daryll Drown of [facility] returning page and will admit. Holding orders to be placed by admitting team. Please see their notes for further documentation of care. I appreciate their help with this pleasant pt's care. Pt stable at time of admission.    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

## 2017-04-06 NOTE — ED Triage Notes (Signed)
Pt started having mild to severe abdominal cramps today.  No n/v.  Pt has hx of crohn's dz.  Pt feels symptoms same.  No diarrhea or bloody stools.

## 2017-04-06 NOTE — ED Notes (Signed)
Report attempted x 1

## 2017-04-06 NOTE — H&P (Signed)
History and Physical    Nathan Shaw WRU:[id number] DOB: 1955/10/23 DOA: 04/06/2017  PCP: Nathan Fess, MD  Patient coming from: Home   Chief Complaint: Abdominal cramping  HPI: Nathan Shaw is a 61 y.o. male with medical history significant of Crohn's disease, allergies, GERD, hypogonadism, herpes simplex presents with abdominal cramping.  Mr. Nathan Shaw has a long history of Crohn's disease.  He is followed by [facility].  Per him, about 1 month ago he developed worsening cramping, vomiting and fever.   CT abdomen/pelvis at that time showed active Crohn's of the terminal ileum.  At that time he was started on 6-MP at 50mg  and then up titrated to 100mg  in hopes to help decrease the active inflammation. Today, he was feeling in his normal state of health.  He ate pizza, mowed the lawn and drank some gatoraide when he developed upper abdominal cramping.  These symptoms worsened and he presented to the hospital with concomitant RLQ sharp pain.  He further notes that he has been having low grade fevers for about 4-5 days and increased urinary frequency.  He presented to his PCP and he was felt to have prostatitis based on exam and was started on Ciprofloxacin, which he has been taking regularly for the last 4-5 days.  Further symptoms include increased reflux.  Otherwise he denies chest pain, headache, chills, nausea, vomiting, diarrhea.  He has been having regular bowel movements without blood.    ED Course: In the ED, he was noted to have a WBC of 11.9 with a neutrophil predominance.  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  Attempted intussusception in the ileum without obstruction.  Stable sacroiliitis  Which is consistent with Crohn's.  Also noted a pulmonary nodule in the left base (follow up based on history, he is a former smoker).  GI and general surgery were consulted in the ED.  Per report of EDP, GI  recommended continuing 6-MP and Zosyn.  No steroids.    Review of Systems: As per HPI otherwise 10 point review of systems negative.   Past Medical History:  Diagnosis Date  . Crohn disease ([place]) last flare up dec 2014/ jan 2015  . Prostatitis   . Wears contact lenses   Hypogonadism on testosterone, GERD  Past Surgical History:  Procedure Laterality Date  . CARPAL TUNNEL RELEASE Right 08/05/2014   Procedure: RIGHT CARPAL TUNNEL RELEASE;  Surgeon: Daryll Brod, MD;  Location: [place];  Service: Orthopedics;  Laterality: Right;  . COLONOSCOPY WITH PROPOFOL N/A 02/16/2014   Procedure: COLONOSCOPY WITH PROPOFOL;  Surgeon: Garlan Fair, MD;  Location: [facility];  Service: Endoscopy;  Laterality: N/A;  . LUNG BIOPSY  1997    was infection   Reviewed with patient, reports daily ETOH use, but has never had withdrawal symptoms or seizures.   reports that he quit smoking about 13 years ago. He has a 27.00 pack-year smoking history. He has never used smokeless tobacco. He reports that he drinks alcohol. He reports that he does not use drugs.  No Known Allergies  Reviewed with patient.   Family History  Problem Relation Age of Onset  . Crohn's disease Sister   . Lung cancer Mother        doing well on Keytruda  . Hypertension Neg Hx   . Diabetes Mellitus II Neg Hx     Prior to Admission medications  Medication Sig Start Date End Date Taking? Authorizing Provider  aspirin EC 81 MG tablet Take 81 mg by mouth daily.   Yes [provider]  AZASAN 100 MG tablet Take 100 mg by mouth daily. 03/26/17  Yes [provider]  cetirizine (ZYRTEC) 10 MG tablet Take 10 mg by mouth daily.   Yes [provider]  Cholecalciferol (VITAMIN D) 2000 units CAPS Take 2,000 Units by mouth daily.   Yes [provider]  ciprofloxacin (CIPRO) 500 MG tablet Take 1 tablet (500 mg total) by mouth 2 (two) times daily. For 10days 12/12/15  Yes Domenic Polite, MD    CYANOCOBALAMIN PO Take 1 tablet by mouth daily. Strength unknown   Yes [provider]  fluticasone (FLONASE) 50 MCG/ACT nasal spray Place 1 spray into both nostrils daily.   Yes [provider]  HUMIRA PEN 40 MG/0.8ML PNKT Inject 40 mg into the skin every 14 (fourteen) days. 04/06/17  Yes [provider]  HYDROcodone-acetaminophen (NORCO) 5-325 MG tablet Take 1-2 tablets by mouth every 6 (six) hours as needed for moderate pain or severe pain. 12/12/15  Yes Domenic Polite, MD  ibuprofen (ADVIL,MOTRIN) 200 MG tablet Take 400 mg by mouth every 6 (six) hours as needed for moderate pain.   Yes [provider]  Misc Natural Products (GLUCOS-CHONDROIT-MSM COMPLEX PO) Take 1 tablet by mouth daily.    Yes [provider]  Multiple Vitamin (MULTIVITAMIN WITH MINERALS) TABS tablet Take 1 tablet by mouth daily.   Yes [provider]  Omega 3 1200 MG CAPS Take 1,200 mg by mouth daily.   Yes [provider]  omeprazole (PRILOSEC) 20 MG capsule Take 20 mg by mouth daily.   Yes [provider]  sildenafil (REVATIO) 20 MG tablet Take 20 mg by mouth daily as needed (erectile dysfunction).   Yes [provider]  testosterone (ANDROGEL) 50 MG/5GM (1%) GEL Place 5 g onto the skin daily.   Yes [provider]  valACYclovir (VALTREX) 500 MG tablet Take 500 mg by mouth daily.   Yes [provider]                                Physical Exam: Vitals:   04/06/17 1702 04/06/17 2055  BP: 136/89 136/84  Pulse: 85 88  Resp: 16 16  Temp: 98.2 F (36.8 C)   TempSrc: Oral   SpO2: 100% 99%    Constitutional: NAD, calm, comfortable Vitals:   04/06/17 1702 04/06/17 2055  BP: 136/89 136/84  Pulse: 85 88  Resp: 16 16  Temp: 98.2 F (36.8 C)   TempSrc: Oral   SpO2: 100% 99%   Eyes: lids and conjunctivae normal ENMT: Mucous membranes are moist.  Neck: normal, supple, no masses Respiratory: clear to auscultation  bilaterally, no wheezing, no crackles.  Normal respiratory effort  Cardiovascular: Regular rate and rhythm, no murmurs / rubs / gallops. No extremity edema. 2+ pedal pulses.   Abdomen: Somewhat hypoactive bowel sounds, + TTP over upper quadrants.  + guarding of RLQ and severe TTP at that site.  Mild distention, but soft.  Musculoskeletal: no clubbing / cyanosis.  Normal muscle tone.  Skin: no rashes, lesions, ulcers. Neurologic: cranial nerves grossly intact, no deficits noted in upper and lower extremities.   Psychiatric: Normal judgment and insight. Alert and oriented x 3.    Labs on Admission: I have personally reviewed following labs and imaging  studies  CBC:  Recent Labs Lab 04/06/17 1757  WBC 11.9*  NEUTROABS 8.9*  HGB 13.6  HCT 40.7  MCV 83.6  PLT 485   Basic Metabolic Panel:  Recent Labs Lab 04/06/17 1756  NA 139  K 3.7  CL 105  CO2 25  GLUCOSE 121*  BUN 8  CREATININE 0.87  CALCIUM 8.9   GFR: CrCl cannot be calculated (Unknown ideal weight.). Liver Function Tests:  Recent Labs Lab 04/06/17 1756  AST 24  ALT 24  ALKPHOS 118  BILITOT 0.5  PROT 6.7  ALBUMIN 2.8*    Recent Labs Lab 04/06/17 1756  LIPASE 20   No results for input(s): AMMONIA in the last 168 hours. Coagulation Profile: No results for input(s): INR, PROTIME in the last 168 hours. Cardiac Enzymes: No results for input(s): CKTOTAL, CKMB, CKMBINDEX, TROPONINI in the last 168 hours. BNP (last 3 results) No results for input(s): PROBNP in the last 8760 hours. HbA1C: No results for input(s): HGBA1C in the last 72 hours. CBG: No results for input(s): GLUCAP in the last 168 hours. Lipid Profile: No results for input(s): CHOL, HDL, LDLCALC, TRIG, CHOLHDL, LDLDIRECT in the last 72 hours. Thyroid Function Tests: No results for input(s): TSH, T4TOTAL, FREET4, T3FREE, THYROIDAB in the last 72 hours. Anemia Panel: No results for input(s): VITAMINB12, FOLATE, FERRITIN, TIBC, IRON, RETICCTPCT  in the last 72 hours. Urine analysis:    Component Value Date/Time   COLORURINE YELLOW 04/06/2017 1902   APPEARANCEUR CLEAR 04/06/2017 1902   LABSPEC 1.012 04/06/2017 1902   PHURINE 7.0 04/06/2017 1902   GLUCOSEU NEGATIVE 04/06/2017 1902   HGBUR NEGATIVE 04/06/2017 1902   BILIRUBINUR NEGATIVE 04/06/2017 1902   KETONESUR NEGATIVE 04/06/2017 1902   PROTEINUR NEGATIVE 04/06/2017 1902   NITRITE NEGATIVE 04/06/2017 1902   LEUKOCYTESUR NEGATIVE 04/06/2017 1902    Radiological Exams on Admission: Ct Abdomen Pelvis W Contrast  Result Date: 04/06/2017 CLINICAL DATA:  Crohn's disease with abdominal pain EXAM: CT ABDOMEN AND PELVIS WITH CONTRAST TECHNIQUE: Multidetector CT imaging of the abdomen and pelvis was performed using the standard protocol following bolus administration of intravenous contrast. Oral contrast was also administered. CONTRAST:  <See Chart> ISOVUE-300 IOPAMIDOL (ISOVUE-300) INJECTION 61% COMPARISON:  March 19, 2017 as well as multiple prior studies FINDINGS: Lower chest: On axial slice 6 series 6, there is a stable 2 mm nodular opacity in the posterior segment of the left lobe of the liver. Lung bases otherwise are clear. Hepatobiliary: No focal liver lesions are evident. Gallbladder wall is not appreciably thickened. There is no biliary duct dilatation. Pancreas: No pancreatic mass or inflammatory focus. Spleen: Spleen is upper normal in size. No splenic lesions are evident. Adrenals/Urinary Tract: Adrenals appear normal bilaterally. There is a 7 mm cyst in the mid left kidney. There is no hydronephrosis on either side. There is no renal or ureteral calculus on either side. Urinary bladder is midline with wall thickness within normal limits. Stomach/Bowel: There is bowel wall thickening throughout most of the ileum including the terminal ileum consistent with known Crohn's disease. There is a fistula between the distal ileum and mesentery in the right upper to mid pelvic region with  focal areas of air consistent with mesenteric abscess in this area measuring 4.9 x 4.7 x 3.4 cm. This mesenteric abscess is larger than on recent prior study. No other abscess is seen. There is mesenteric stranding along much of the ileum, particularly in the right abdomen. There is mild loculated fluid in this area. There  is enhancement of bowel wall in the mid to distal ileum, consistent with acute inflammation. In the proximal ileal region, there is localized bowel dilatation with attempted intussusception. Oral contrast does pass through this area. More proximally, fat is noted in the wall of the duodenum. No bowel inflammation is noted proximal to the ileum. There is no frank bowel obstruction. There is no free air or portal venous air beyond the air which is seen within the area of the mesenteric abscess at the site of the fistula with a distal small bowel. Vascular/Lymphatic: There is atherosclerotic calcification in the aorta and iliac arteries. No evident aneurysm. Major mesenteric vessels appear patent. No adenopathy evident in the abdomen or pelvis. Reproductive: Prostate and seminal vesicles appear normal in size and contour. No pelvic mass beyond bowel inflammation. Other: There is mild loculated ascites in the dependent portion of the pelvis. Appendix not seen. Appendix may well be involved with inflammation from the terminal ileitis and nearby mesenteric abscess. Musculoskeletal: There is chronic anterior wedging of the L2 vertebral body. There are no blastic or lytic bone lesions. There is symmetric sacroiliitis consistent with the Crohn's disease. No blastic or lytic bone lesions. No intramuscular or abdominal wall lesion evident. IMPRESSION: 1. Extensive ileitis. 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 There is considerably more inflammation in the right lower quadrant involvement of the  mid and distal ileum than on recent prior study. Attempted intussusception more proximally in the ileum without obstruction. 2. Fat in the duodenum wall, likely due to an active residua of Crohn's disease. 3. No inflammatory change noted in bowel proximal to the ileum currently. 4. No renal or ureteral calculus. No hydronephrosis. No biliary duct dilatation. 5.  Stable symmetric sacroiliitis consistent with Crohn's disease. 6.  Aortic atherosclerosis. 7. 2 mm nodular opacity posterior left base. No follow-up needed if patient is low-risk. Non-contrast chest CT can be considered in 12 months if patient is high-risk. This recommendation follows the consensus statement: Guidelines for Management of Incidental Pulmonary Nodules Detected on CT Images: From the Fleischner Society 2017; Radiology 2017; 284:228-243. Aortic Atherosclerosis (ICD10-I70.0). Electronically Signed   By: Lowella Grip III M.D.   On: 04/06/2017 20:27    Assessment/Plan  Crohn's ileitis, with abscess - Started on Zosyn by the ED, this was continued - GI consult pending - Surgery consult pending - IVF with NS at 100cc/hr - NPO, sips with meds - Continue 6-MP at 100mg /day - Continue valtrex for suppression while on immunosuppression - Continue PPI for reflux symptoms - Hydrocodone and morphine for pain - Zofran for nausea - Blood cultures ordered and pending  Allergies - Claritin - on formulary - Flonase PRN  ? Prostatitis - UA here completely normal - Possibly fevers were sign of forming abscess?  - Ciprofloxacin held  Hypogonadism, on testosterone - No testosterone level in our system, he is on testosterone therapy - Continue testosterone replacement - Attempt to get PCP notes   Daily ETOH use - He reports never withdrawing from ETOH, no tremors or seizures.  Drinks 3 drinks per day.  - No CIWA started, but have a low threshold to start if change in mentation, tremor, etc.   DVT prophylaxis: Lovenox - he has a  high inflammatory state. Hold if it appears surgery will be done.  Code Status: Full  Family Communication: Significant other at bedside  Disposition Plan: Admit to inpatient,  discharge in 3-4 days pending plan  Consults called: GI, Surgery by ED  Admission status: Inpatient, Buren Kos MD [facility] Pager [phone]  If 7PM-7AM, please contact night-coverage [email or web address] Password [email or web address]  04/06/2017, 10:05 PM

## 2017-04-07 ENCOUNTER — Encounter (HOSPITAL_COMMUNITY): Payer: Self-pay | Admitting: Surgery

## 2017-04-07 DIAGNOSIS — K651 Peritoneal abscess: Secondary | ICD-10-CM

## 2017-04-07 DIAGNOSIS — K50013 Crohn's disease of small intestine with fistula: Secondary | ICD-10-CM

## 2017-04-07 LAB — BASIC METABOLIC PANEL
Anion gap: 10 (ref 5–15)
BUN: 6 mg/dL (ref 6–20)
CO2: 26 mmol/L (ref 22–32)
Calcium: 8.8 mg/dL — ABNORMAL LOW (ref 8.9–10.3)
Chloride: 104 mmol/L (ref 101–111)
Creatinine, Ser: 0.93 mg/dL (ref 0.61–1.24)
GFR calc Af Amer: >60 mL/min (ref >60)
GFR calc non Af Amer: >60 mL/min (ref >60)
Glucose, Bld: 91 mg/dL (ref 65–99)
Potassium: 3.7 mmol/L (ref 3.5–5.1)
Sodium: 140 mmol/L (ref 135–145)

## 2017-04-07 LAB — CBC
HCT: 39.9 % (ref 39.0–52.0)
Hemoglobin: 13.2 g/dL (ref 13.0–17.0)
MCH: 27.7 pg (ref 26.0–34.0)
MCHC: 33.1 g/dL (ref 30.0–36.0)
MCV: 83.6 fL (ref 78.0–100.0)
Platelets: 367 10*3/uL (ref 150–400)
RBC: 4.77 MIL/uL (ref 4.22–5.81)
RDW: 13.1 % (ref 11.5–15.5)
WBC: 11 10*3/uL — ABNORMAL HIGH (ref 4.0–10.5)

## 2017-04-07 LAB — GLUCOSE, CAPILLARY: Glucose-Capillary: 91 mg/dL (ref 65–99)

## 2017-04-07 MED ORDER — KCL IN DEXTROSE-NACL 20-5-0.45 MEQ/L-%-% IV SOLN
INTRAVENOUS | Status: DC
  Administered 2017-04-07 (×2): via INTRAVENOUS
  Filled 2017-04-07 (×3): qty 1000

## 2017-04-07 MED ORDER — FAMOTIDINE IN NACL 20-0.9 MG/50ML-% IV SOLN
20.0000 mg | INTRAVENOUS | Status: DC
  Administered 2017-04-07 – 2017-04-14 (×7): 20 mg via INTRAVENOUS
  Filled 2017-04-07 (×10): qty 50

## 2017-04-07 NOTE — Consult Note (Signed)
[facility] Consult Note  Referring Provider: No ref. provider found Primary Care Physician:  Hulan Fess, MD Primary Gastroenterologist:  Dr.  Laurel Dimmer Complaint: Upper abdominal pain HPI: Nathan Shaw is an 61 y.o. white male  with a 30 year history of Crohn's disease who had done fairly well over the years until March 2017 when he developed a contained ileal perforation with abscess and was started on Humira after inpatient treatment with Zosyn. In June of this year he was seen in the walk-in clinic and had radiologic evidence of ileus versus partial small bowel obstruction which was treated conservatively. He recently switched care from Dr. Wynetta Emery to Dr. Therisa Doyne. She obtained a CT enterography which showed persistent active Crohn's disease involving the terminal ileum with some resulting partial small bowel obstruction with probable skip lesion involving the proximal ileum with previously demonstrated pericolonic inflammation changes from focal perforation from 2017 improved with no residual focal extraluminal fluid collection. She started him on 6-MP 50 mg which was recently increased to 100 mg. He developed worsening abdominal pain yesterday and came to the emergency room. 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 There is also a localized bowel dilatation with nonobstructing intussusception. He was started on IV Zosyn and steroids currently withheld.  Past Medical History:  Diagnosis Date  . Crohn disease ([place]) last flare up dec 2014/ jan 2015  . Prostatitis   . Wears contact lenses     Past Surgical History:  Procedure Laterality Date  . CARPAL TUNNEL RELEASE Right 08/05/2014   Procedure: RIGHT CARPAL TUNNEL RELEASE;  Surgeon: Daryll Brod, MD;  Location: [facility];   Service: Orthopedics;  Laterality: Right;  . COLONOSCOPY WITH PROPOFOL N/A 02/16/2014   Procedure: COLONOSCOPY WITH PROPOFOL;  Surgeon: Garlan Fair, MD;  Location: [facility];  Service: Endoscopy;  Laterality: N/A;  . LUNG BIOPSY  1997    was infection    Medications Prior to Admission  Medication Sig Dispense Refill  . aspirin EC 81 MG tablet Take 81 mg by mouth daily.    . AZASAN 100 MG tablet Take 100 mg by mouth daily.  3  . cetirizine (ZYRTEC) 10 MG tablet Take 10 mg by mouth daily.    . Cholecalciferol (VITAMIN D) 2000 units CAPS Take 2,000 Units by mouth daily.    . ciprofloxacin (CIPRO) 500 MG tablet Take 1 tablet (500 mg total) by mouth 2 (two) times daily. For 10days 20 tablet 0  . CYANOCOBALAMIN PO Take 1 tablet by mouth daily. Strength unknown    . fluticasone (FLONASE) 50 MCG/ACT nasal spray Place 1 spray into both nostrils daily.    Marland Kitchen HUMIRA PEN 40 MG/0.8ML PNKT Inject 40 mg into the skin every 14 (fourteen) days.  11  . HYDROcodone-acetaminophen (NORCO) 5-325 MG tablet Take 1-2 tablets by mouth every 6 (six) hours as needed for moderate pain or severe pain. 30 tablet 0  . ibuprofen (ADVIL,MOTRIN) 200 MG tablet Take 400 mg by mouth every 6 (six) hours as needed for moderate pain.    . Misc Natural Products (GLUCOS-CHONDROIT-MSM COMPLEX PO) Take 1 tablet by mouth daily.     . Multiple Vitamin (MULTIVITAMIN WITH MINERALS) TABS tablet Take 1 tablet by mouth daily.    . Omega 3 1200 MG  CAPS Take 1,200 mg by mouth daily.    Marland Kitchen omeprazole (PRILOSEC) 20 MG capsule Take 20 mg by mouth daily.    . sildenafil (REVATIO) 20 MG tablet Take 20 mg by mouth daily as needed (erectile dysfunction).    Marland Kitchen testosterone (ANDROGEL) 50 MG/5GM (1%) GEL Place 5 g onto the skin daily.    . valACYclovir (VALTREX) 500 MG tablet Take 500 mg by mouth daily.    . metroNIDAZOLE (FLAGYL) 500 MG tablet Take 1 tablet (500 mg total) by mouth every 8 (eight) hours. For 10days (Patient not taking: Reported on  04/06/2017) 30 tablet 0  . ondansetron (ZOFRAN-ODT) 4 MG disintegrating tablet Take 1 tablet (4 mg total) by mouth every 8 (eight) hours as needed for nausea or vomiting. (Patient not taking: Reported on 04/06/2017) 20 tablet 0  . saccharomyces boulardii (FLORASTOR) 250 MG capsule Take 1 capsule (250 mg total) by mouth 2 (two) times daily. For 10days (Patient not taking: Reported on 04/06/2017) 20 capsule 0  . UNABLE TO FIND This note is to excuse Mr.Tighe Guymon from work due to medical illness requiring Hospitalization from 11/30/15-12/12/15, ok to return to work by 12/19/15 (Patient not taking: Reported on 04/06/2017) 1 each 0    Allergies: No Known Allergies  Family History  Problem Relation Age of Onset  . Crohn's disease Sister   . Lung cancer Mother        doing well on Keytruda  . Hypertension Neg Hx   . Diabetes Mellitus II Neg Hx     Social History:  reports that he quit smoking about 13 years ago. He has a 27.00 pack-year smoking history. He has never used smokeless tobacco. He reports that he drinks alcohol. He reports that he does not use drugs.  Review of Systems: negative except As above, no vomiting.   Blood pressure 133/73, pulse 86, temperature 98.4 F (36.9 C), temperature source Oral, resp. rate 18, height _0  (1.803 m), weight 83.6 kg (184 lb 4.9 oz), SpO2 98 %. Head: Normocephalic, without obvious abnormality, atraumatic Neck: no adenopathy, no carotid bruit, no JVD, supple, symmetrical, trachea midline and thyroid not enlarged, symmetric, no tenderness/mass/nodules Resp: clear to auscultation bilaterally Cardio: regular rate and rhythm, S1, S2 normal, no murmur, click, rub or gallop GI: Fullness and tenderness throughout the mid abdomen down to the right lower quadrant with guarding and slight rebound Extremities: extremities normal, atraumatic, no cyanosis or edema  Results for orders placed or performed during the hospital encounter of 04/06/17 (from the past 48  hour(s))  Lipase, blood     Status: None   Collection Time: 04/06/17  5:56 PM  Result Value Ref Range   Lipase 20 11 - 51 U/L  Comprehensive metabolic panel     Status: Abnormal   Collection Time: 04/06/17  5:56 PM  Result Value Ref Range   Sodium 139 135 - 145 mmol/L   Potassium 3.7 3.5 - 5.1 mmol/L   Chloride 105 101 - 111 mmol/L   CO2 25 22 - 32 mmol/L   Glucose, Bld 121 (H) 65 - 99 mg/dL   BUN 8 6 - 20 mg/dL   Creatinine, Ser 0.87 0.61 - 1.24 mg/dL   Calcium 8.9 8.9 - 10.3 mg/dL   Total Protein 6.7 6.5 - 8.1 g/dL   Albumin 2.8 (L) 3.5 - 5.0 g/dL   AST 24 15 - 41 U/L   ALT 24 17 - 63 U/L   Alkaline Phosphatase 118 38 - 126 U/L  Total Bilirubin 0.5 0.3 - 1.2 mg/dL   GFR calc non Af Amer >60 >60 mL/min   GFR calc Af Amer >60 >60 mL/min    Comment: (NOTE) The eGFR has been calculated using the CKD EPI equation. This calculation has not been validated in all clinical situations. eGFR's persistently <60 mL/min signify possible Chronic Kidney Disease.    Anion gap 9 5 - 15  CBC with Differential     Status: Abnormal   Collection Time: 04/06/17  5:57 PM  Result Value Ref Range   WBC 11.9 (H) 4.0 - 10.5 K/uL   RBC 4.87 4.22 - 5.81 MIL/uL   Hemoglobin 13.6 13.0 - 17.0 g/dL   HCT 40.7 39.0 - 52.0 %   MCV 83.6 78.0 - 100.0 fL   MCH 27.9 26.0 - 34.0 pg   MCHC 33.4 30.0 - 36.0 g/dL   RDW 13.1 11.5 - 15.5 %   Platelets 362 150 - 400 K/uL   Neutrophils Relative % 75 %   Neutro Abs 8.9 (H) 1.7 - 7.7 K/uL   Lymphocytes Relative 14 %   Lymphs Abs 1.7 0.7 - 4.0 K/uL   Monocytes Relative 8 %   Monocytes Absolute 1.0 0.1 - 1.0 K/uL   Eosinophils Relative 3 %   Eosinophils Absolute 0.3 0.0 - 0.7 K/uL   Basophils Relative 0 %   Basophils Absolute 0.0 0.0 - 0.1 K/uL  Urinalysis, Routine w reflex microscopic     Status: None   Collection Time: 04/06/17  7:02 PM  Result Value Ref Range   Color, Urine YELLOW YELLOW   APPearance CLEAR CLEAR   Specific Gravity, Urine 1.012 1.005 -  1.030   pH 7.0 5.0 - 8.0   Glucose, UA NEGATIVE NEGATIVE mg/dL   Hgb urine dipstick NEGATIVE NEGATIVE   Bilirubin Urine NEGATIVE NEGATIVE   Ketones, ur NEGATIVE NEGATIVE mg/dL   Protein, ur NEGATIVE NEGATIVE mg/dL   Nitrite NEGATIVE NEGATIVE   Leukocytes, UA NEGATIVE NEGATIVE  Basic metabolic panel     Status: Abnormal   Collection Time: 04/07/17  1:00 AM  Result Value Ref Range   Sodium 140 135 - 145 mmol/L   Potassium 3.7 3.5 - 5.1 mmol/L   Chloride 104 101 - 111 mmol/L   CO2 26 22 - 32 mmol/L   Glucose, Bld 91 65 - 99 mg/dL   BUN 6 6 - 20 mg/dL   Creatinine, Ser 0.93 0.61 - 1.24 mg/dL   Calcium 8.8 (L) 8.9 - 10.3 mg/dL   GFR calc non Af Amer >60 >60 mL/min   GFR calc Af Amer >60 >60 mL/min    Comment: (NOTE) The eGFR has been calculated using the CKD EPI equation. This calculation has not been validated in all clinical situations. eGFR's persistently <60 mL/min signify possible Chronic Kidney Disease.    Anion gap 10 5 - 15  CBC     Status: Abnormal   Collection Time: 04/07/17  1:00 AM  Result Value Ref Range   WBC 11.0 (H) 4.0 - 10.5 K/uL   RBC 4.77 4.22 - 5.81 MIL/uL   Hemoglobin 13.2 13.0 - 17.0 g/dL   HCT 39.9 39.0 - 52.0 %   MCV 83.6 78.0 - 100.0 fL   MCH 27.7 26.0 - 34.0 pg   MCHC 33.1 30.0 - 36.0 g/dL   RDW 13.1 11.5 - 15.5 %   Platelets 367 150 - 400 K/uL   Ct Abdomen Pelvis W Contrast  Result Date: 04/06/2017 CLINICAL DATA:  Crohn's disease with abdominal pain EXAM: CT ABDOMEN AND PELVIS WITH CONTRAST TECHNIQUE: Multidetector CT imaging of the abdomen and pelvis was performed using the standard protocol following bolus administration of intravenous contrast. Oral contrast was also administered. CONTRAST:  <See Chart> ISOVUE-300 IOPAMIDOL (ISOVUE-300) INJECTION 61% COMPARISON:  March 19, 2017 as well as multiple prior studies FINDINGS: Lower chest: On axial slice 6 series 6, there is a stable 2 mm nodular opacity in the posterior segment of the left lobe of the  liver. Lung bases otherwise are clear. Hepatobiliary: No focal liver lesions are evident. Gallbladder wall is not appreciably thickened. There is no biliary duct dilatation. Pancreas: No pancreatic mass or inflammatory focus. Spleen: Spleen is upper normal in size. No splenic lesions are evident. Adrenals/Urinary Tract: Adrenals appear normal bilaterally. There is a 7 mm cyst in the mid left kidney. There is no hydronephrosis on either side. There is no renal or ureteral calculus on either side. Urinary bladder is midline with wall thickness within normal limits. Stomach/Bowel: There is bowel wall thickening throughout most of the ileum including the terminal ileum consistent with known Crohn's disease. There is a fistula between the distal ileum and mesentery in the right upper to mid pelvic region with focal areas of air consistent with mesenteric abscess in this area measuring 4.9 x 4.7 x 3.4 cm. This mesenteric abscess is larger than on recent prior study. No other abscess is seen. There is mesenteric stranding along much of the ileum, particularly in the right abdomen. There is mild loculated fluid in this area. There is enhancement of bowel wall in the mid to distal ileum, consistent with acute inflammation. In the proximal ileal region, there is localized bowel dilatation with attempted intussusception. Oral contrast does pass through this area. More proximally, fat is noted in the wall of the duodenum. No bowel inflammation is noted proximal to the ileum. There is no frank bowel obstruction. There is no free air or portal venous air beyond the air which is seen within the area of the mesenteric abscess at the site of the fistula with a distal small bowel. Vascular/Lymphatic: There is atherosclerotic calcification in the aorta and iliac arteries. No evident aneurysm. Major mesenteric vessels appear patent. No adenopathy evident in the abdomen or pelvis. Reproductive: Prostate and seminal vesicles appear  normal in size and contour. No pelvic mass beyond bowel inflammation. Other: There is mild loculated ascites in the dependent portion of the pelvis. Appendix not seen. Appendix may well be involved with inflammation from the terminal ileitis and nearby mesenteric abscess. Musculoskeletal: There is chronic anterior wedging of the L2 vertebral body. There are no blastic or lytic bone lesions. There is symmetric sacroiliitis consistent with the Crohn's disease. No blastic or lytic bone lesions. No intramuscular or abdominal wall lesion evident. IMPRESSION: 1. Extensive ileitis. 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 There is considerably more inflammation in the right lower quadrant involvement of the mid and distal ileum than on recent prior study. Attempted intussusception more proximally in the ileum without obstruction. 2. Fat in the duodenum wall, likely due to an active residua of Crohn's disease. 3. No inflammatory change noted in bowel proximal to the ileum currently. 4. No renal or ureteral calculus. No hydronephrosis. No biliary duct dilatation. 5.  Stable symmetric sacroiliitis consistent with Crohn's disease. 6.  Aortic atherosclerosis. 7. 2 mm nodular opacity posterior left base. No  follow-up needed if patient is low-risk. Non-contrast chest CT can be considered in 12 months if patient is high-risk. This recommendation follows the consensus statement: Guidelines for Management of Incidental Pulmonary Nodules Detected on CT Images: From the Fleischner Society 2017; Radiology 2017; 284:228-243. Aortic Atherosclerosis (ICD10-I70.0). Electronically Signed   By: Lowella Grip III M.D.   On: 04/06/2017 20:27    1. Assessment: 1. 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 Plan:  1. Broad-spectrum  antibiotics. Will continue 6-MP but with hold corticosteroids for now.  2. Will obtain surgical consult for assistance with management and consideration of drainage or eventual more definitive surgery.. 3. Limit to ice chips until seen by surgery 4. Due for next Humira dose in one week. Nakea Gouger C 04/07/2017, 6:39 AM  Pager [phone] If no answer or after 5 PM call [phone]

## 2017-04-07 NOTE — Progress Notes (Signed)
Patient had an episode of vomiting at approximately 2350 after drinking ginger ale. Zofran given. Will continue to monitor

## 2017-04-07 NOTE — Progress Notes (Signed)
Patient and family updated about plan of care, rounding medical teams, as well as IV fluids, pain medications and abx regimen. All other questions answered.

## 2017-04-07 NOTE — Consult Note (Signed)
General Surgery [facility]  Reason for Consult: Crohn's ileitis with abscess  Referring Physician: Dr. Cathlean Sauer, [facility]  [place] Nabor is an 61 y.o. male.  HPI: patient is a pleasant 61 yo WM with long hx of Crohn's disease.  No prior abdominal surgery.  Currently on 6-MP and Humira every two weeks.  GI was Dr. Earle Gell, now retired, now seeing Dr. Therisa Doyne at [facility].  Patient had flare of ileitis 3 weeks ago.  Now with increased pain, new CT scan showing increased inflammation in terminal ileum, fistula formation, and irregular 4 cm abscess in mesentery.  Admitted to medical service and started on IV abx's.  General surgery asked to evaluate for possible operative intervention during this hospitalization.  No prior abdominal surgery.  Hx of Crohn's in patient's sister with multiple surgeries.  Past Medical History:  Diagnosis Date  . Crohn disease ([place]) last flare up dec 2014/ jan 2015  . Prostatitis   . Wears contact lenses     Past Surgical History:  Procedure Laterality Date  . CARPAL TUNNEL RELEASE Right 08/05/2014   Procedure: RIGHT CARPAL TUNNEL RELEASE;  Surgeon: Daryll Brod, MD;  Location: [place];  Service: Orthopedics;  Laterality: Right;  . COLONOSCOPY WITH PROPOFOL N/A 02/16/2014   Procedure: COLONOSCOPY WITH PROPOFOL;  Surgeon: Garlan Fair, MD;  Location: [facility];  Service: Endoscopy;  Laterality: N/A;  . LUNG BIOPSY  1997    was infection    Family History  Problem Relation Age of Onset  . Crohn's disease Sister   . Lung cancer Mother        doing well on Keytruda  . Hypertension Neg Hx   . Diabetes Mellitus II Neg Hx     Social History:  reports that he quit smoking about 13 years ago. He has a 27.00 pack-year smoking history. He has never used smokeless tobacco. He reports that he drinks alcohol. He reports that he does not use drugs.  Allergies: No Known Allergies  Medications: I have reviewed the  patient's current medications.  Results for orders placed or performed during the hospital encounter of 04/06/17 (from the past 48 hour(s))  Lipase, blood     Status: None   Collection Time: 04/06/17  5:56 PM  Result Value Ref Range   Lipase 20 11 - 51 U/L  Comprehensive metabolic panel     Status: Abnormal   Collection Time: 04/06/17  5:56 PM  Result Value Ref Range   Sodium 139 135 - 145 mmol/L   Potassium 3.7 3.5 - 5.1 mmol/L   Chloride 105 101 - 111 mmol/L   CO2 25 22 - 32 mmol/L   Glucose, Bld 121 (H) 65 - 99 mg/dL   BUN 8 6 - 20 mg/dL   Creatinine, Ser 0.87 0.61 - 1.24 mg/dL   Calcium 8.9 8.9 - 10.3 mg/dL   Total Protein 6.7 6.5 - 8.1 g/dL   Albumin 2.8 (L) 3.5 - 5.0 g/dL   AST 24 15 - 41 U/L   ALT 24 17 - 63 U/L   Alkaline Phosphatase 118 38 - 126 U/L   Total Bilirubin 0.5 0.3 - 1.2 mg/dL   GFR calc non Af Amer >60 >60 mL/min   GFR calc Af Amer >60 >60 mL/min    Comment: (NOTE) The eGFR has been calculated using the CKD EPI equation. This calculation has not been validated in all clinical situations. eGFR's persistently <60 mL/min signify possible Chronic Kidney  Disease.    Anion gap 9 5 - 15  CBC with Differential     Status: Abnormal   Collection Time: 04/06/17  5:57 PM  Result Value Ref Range   WBC 11.9 (H) 4.0 - 10.5 K/uL   RBC 4.87 4.22 - 5.81 MIL/uL   Hemoglobin 13.6 13.0 - 17.0 g/dL   HCT 40.7 39.0 - 52.0 %   MCV 83.6 78.0 - 100.0 fL   MCH 27.9 26.0 - 34.0 pg   MCHC 33.4 30.0 - 36.0 g/dL   RDW 13.1 11.5 - 15.5 %   Platelets 362 150 - 400 K/uL   Neutrophils Relative % 75 %   Neutro Abs 8.9 (H) 1.7 - 7.7 K/uL   Lymphocytes Relative 14 %   Lymphs Abs 1.7 0.7 - 4.0 K/uL   Monocytes Relative 8 %   Monocytes Absolute 1.0 0.1 - 1.0 K/uL   Eosinophils Relative 3 %   Eosinophils Absolute 0.3 0.0 - 0.7 K/uL   Basophils Relative 0 %   Basophils Absolute 0.0 0.0 - 0.1 K/uL  Urinalysis, Routine w reflex microscopic     Status: None   Collection Time: 04/06/17   7:02 PM  Result Value Ref Range   Color, Urine YELLOW YELLOW   APPearance CLEAR CLEAR   Specific Gravity, Urine 1.012 1.005 - 1.030   pH 7.0 5.0 - 8.0   Glucose, UA NEGATIVE NEGATIVE mg/dL   Hgb urine dipstick NEGATIVE NEGATIVE   Bilirubin Urine NEGATIVE NEGATIVE   Ketones, ur NEGATIVE NEGATIVE mg/dL   Protein, ur NEGATIVE NEGATIVE mg/dL   Nitrite NEGATIVE NEGATIVE   Leukocytes, UA NEGATIVE NEGATIVE  Basic metabolic panel     Status: Abnormal   Collection Time: 04/07/17  1:00 AM  Result Value Ref Range   Sodium 140 135 - 145 mmol/L   Potassium 3.7 3.5 - 5.1 mmol/L   Chloride 104 101 - 111 mmol/L   CO2 26 22 - 32 mmol/L   Glucose, Bld 91 65 - 99 mg/dL   BUN 6 6 - 20 mg/dL   Creatinine, Ser 0.93 0.61 - 1.24 mg/dL   Calcium 8.8 (L) 8.9 - 10.3 mg/dL   GFR calc non Af Amer >60 >60 mL/min   GFR calc Af Amer >60 >60 mL/min    Comment: (NOTE) The eGFR has been calculated using the CKD EPI equation. This calculation has not been validated in all clinical situations. eGFR's persistently <60 mL/min signify possible Chronic Kidney Disease.    Anion gap 10 5 - 15  CBC     Status: Abnormal   Collection Time: 04/07/17  1:00 AM  Result Value Ref Range   WBC 11.0 (H) 4.0 - 10.5 K/uL   RBC 4.77 4.22 - 5.81 MIL/uL   Hemoglobin 13.2 13.0 - 17.0 g/dL   HCT 39.9 39.0 - 52.0 %   MCV 83.6 78.0 - 100.0 fL   MCH 27.7 26.0 - 34.0 pg   MCHC 33.1 30.0 - 36.0 g/dL   RDW 13.1 11.5 - 15.5 %   Platelets 367 150 - 400 K/uL  Glucose, capillary     Status: None   Collection Time: 04/07/17  7:09 AM  Result Value Ref Range   Glucose-Capillary 91 65 - 99 mg/dL    Ct Abdomen Pelvis W Contrast  Result Date: 04/06/2017 CLINICAL DATA:  Crohn's disease with abdominal pain EXAM: CT ABDOMEN AND PELVIS WITH CONTRAST TECHNIQUE: Multidetector CT imaging of the abdomen and pelvis was performed using the standard protocol following  bolus administration of intravenous contrast. Oral contrast was also administered.  CONTRAST:  <See Chart> ISOVUE-300 IOPAMIDOL (ISOVUE-300) INJECTION 61% COMPARISON:  March 19, 2017 as well as multiple prior studies FINDINGS: Lower chest: On axial slice 6 series 6, there is a stable 2 mm nodular opacity in the posterior segment of the left lobe of the liver. Lung bases otherwise are clear. Hepatobiliary: No focal liver lesions are evident. Gallbladder wall is not appreciably thickened. There is no biliary duct dilatation. Pancreas: No pancreatic mass or inflammatory focus. Spleen: Spleen is upper normal in size. No splenic lesions are evident. Adrenals/Urinary Tract: Adrenals appear normal bilaterally. There is a 7 mm cyst in the mid left kidney. There is no hydronephrosis on either side. There is no renal or ureteral calculus on either side. Urinary bladder is midline with wall thickness within normal limits. Stomach/Bowel: There is bowel wall thickening throughout most of the ileum including the terminal ileum consistent with known Crohn's disease. There is a fistula between the distal ileum and mesentery in the right upper to mid pelvic region with focal areas of air consistent with mesenteric abscess in this area measuring 4.9 x 4.7 x 3.4 cm. This mesenteric abscess is larger than on recent prior study. No other abscess is seen. There is mesenteric stranding along much of the ileum, particularly in the right abdomen. There is mild loculated fluid in this area. There is enhancement of bowel wall in the mid to distal ileum, consistent with acute inflammation. In the proximal ileal region, there is localized bowel dilatation with attempted intussusception. Oral contrast does pass through this area. More proximally, fat is noted in the wall of the duodenum. No bowel inflammation is noted proximal to the ileum. There is no frank bowel obstruction. There is no free air or portal venous air beyond the air which is seen within the area of the mesenteric abscess at the site of the fistula with a distal  small bowel. Vascular/Lymphatic: There is atherosclerotic calcification in the aorta and iliac arteries. No evident aneurysm. Major mesenteric vessels appear patent. No adenopathy evident in the abdomen or pelvis. Reproductive: Prostate and seminal vesicles appear normal in size and contour. No pelvic mass beyond bowel inflammation. Other: There is mild loculated ascites in the dependent portion of the pelvis. Appendix not seen. Appendix may well be involved with inflammation from the terminal ileitis and nearby mesenteric abscess. Musculoskeletal: There is chronic anterior wedging of the L2 vertebral body. There are no blastic or lytic bone lesions. There is symmetric sacroiliitis consistent with the Crohn's disease. No blastic or lytic bone lesions. No intramuscular or abdominal wall lesion evident. IMPRESSION: 1. Extensive ileitis. 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 There is considerably more inflammation in the right lower quadrant involvement of the mid and distal ileum than on recent prior study. Attempted intussusception more proximally in the ileum without obstruction. 2. Fat in the duodenum wall, likely due to an active residua of Crohn's disease. 3. No inflammatory change noted in bowel proximal to the ileum currently. 4. No renal or ureteral calculus. No hydronephrosis. No biliary duct dilatation. 5.  Stable symmetric sacroiliitis consistent with Crohn's disease. 6.  Aortic atherosclerosis. 7. 2 mm nodular opacity posterior left base. No follow-up needed if patient is low-risk. Non-contrast chest CT can be considered in 12 months if patient is high-risk. This recommendation follows the consensus statement: Guidelines for Management  of Incidental Pulmonary Nodules Detected on CT Images: From the Fleischner Society 2017; Radiology 2017; [phone]. Aortic Atherosclerosis (ICD10-I70.0).  Electronically Signed   By: Lowella Grip III M.D.   On: 04/06/2017 20:27    ROS Blood pressure 113/72, pulse 80, temperature 97.7 F (36.5 C), temperature source Oral, resp. rate 16, height '5\' 11"'  (1.803 m), weight 83.6 kg (184 lb 4.9 oz), SpO2 97 %. Physical Exam  Assessment/Plan: Crohn's ileitis with fistula formation, mesenteric abscess  Agree with admission to medical service  IV Zosyn  Allow clear liquid diet today  Will obtain review by IR tomorrow for possibility of percutaneous aspiration or drainage  Dr. Leighton Ruff, colorectal surgery, will see patient for our practice tomorrow and evaluate and discuss with GI service.  May require operative intervention during this hospitalization.  Discussed with patient and wife at bedside this morning.  Earnstine Regal, MD, [facility] Office: [place] 04/07/2017, 10:22 AM

## 2017-04-07 NOTE — Progress Notes (Signed)
PROGRESS NOTE    Jamine Highfill  AJO:[id number] DOB: 08-17-1956 DOA: 04/06/2017 PCP: Hulan Fess, MD    Brief Narrative:  61 year old male who presented with abdominal cramping. Patient is known to have Crohn's disease, GERD, hypogonadism. About 4 weeks ago he had a episode of Crohn's flare, treated with 6-MP, with improvement of his symptoms. The day of admission he had acute recurrence of abdominal pain, colicky in nature, severe in intensity and worsening, localized in the right lower quadrant, then he presented to the hospital due to worsening symptoms. He was recently treated for an episode of prostatitis with ciprofloxacin. On initial physical examination blood pressure 136/89, heart rate 85, respiratory rate 16, temperature 98.2, oxygen saturation 100%, mucous membranes were moist, lungs were clear to auscultation bilaterally, heart S1-S2 present rhythmic, the abdomen was distended, had hypoactive bowel sounds, tender to palpation in the upper quadrants and more pronounced on the right lower quadrant, positive guarding at that site. Sodium 139, potassium 3.7, chloride 105, bicarbonate 25, glucose 121, BUN 8, creatinine 0.87, AST 24, ALT 24, white count 11.9, hemoglobin 13.6, hematocrit 40.7, platelets 360, urinalysis negative for infection. 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   Patient was admitted to medical floor with working diagnosis of acute Crohn's flare complicated by mesenteric abscess.   Assessment & Plan:   Active Problems:   GERD (gastroesophageal reflux disease)   Crohn's ileitis, with abscess ([place])   1. Crohn's flare with mesenteric abscess. Will continue antibiotic therapy with IV Zosyn, follow on cell count and temperature curve. Will continue azathioprine , holding on steroids. Will continue IV fluids, IV analgesic and IV antiemetics, will add antiacid  therapy. Clear liquid diet as tolerated. Will follow on surgery and gastroenterology recommendations.   2. History of alcohol abuse. No signs of withdrawal, will continue neuro checks per unit protocol.  3. Hypogonadism. Continue home regimen with topical testosterone.  4. History of herpes. Continue on valacyclovir.        DVT prophylaxis: enoxaparin  Code Status: full  Family Communication:  Disposition Plan: home    Consultants:   Surgery  Gastroenterology   Procedures:    Antimicrobials:   Zosyn.    Subjective: Positive abdominal pain, generalized, colicky in nature, intermittent, improved with analgesics, no nausea or vomiting no diarrhea or rectal bleeding. No fever or chills.   Objective: Vitals:   04/06/17 2055 04/06/17 2320 04/07/17 0115 04/07/17 0648  BP: 136/84 133/73  113/72  Pulse: 88 86  80  Resp: 16 18  16   Temp:  98.4 F (36.9 C)  97.7 F (36.5 C)  TempSrc:  Oral  Oral  SpO2: 99% 98%  97%  Weight:   83.6 kg (184 lb 4.9 oz)   Height:   5\' 11"  (1.803 m)     Intake/Output Summary (Last 24 hours) at 04/07/17 0926 Last data filed at 04/07/17 0917  Gross per 24 hour  Intake             1553 ml  Output              675 ml  Net              878 ml   Filed Weights   04/07/17 0115  Weight: 83.6 kg (184 lb 4.9 oz)    Examination:  General exam: deconditioned E ENT: mild pallor, oral mucosa moist, no icterus.  Respiratory system: Clear to auscultation. Respiratory effort normal. No wheezing, rales or rhonchi.  Cardiovascular system: S1 & S2 heard, RRR. No JVD, murmurs, rubs, gallops or clicks. No pedal edema. Gastrointestinal system: Abdomen is distended, soft. Tender to deep palpation. No organomegaly or masses felt. Normal bowel sounds heard. Central nervous system: Alert and oriented. No focal neurological deficits. Extremities: Symmetric 5 x 5 power. Skin: No rashes, lesions or ulcers.     Data Reviewed: I have personally reviewed  following labs and imaging studies  CBC:  Recent Labs Lab 04/06/17 1757 04/07/17 0100  WBC 11.9* 11.0*  NEUTROABS 8.9*  --   HGB 13.6 13.2  HCT 40.7 39.9  MCV 83.6 83.6  PLT 362 858   Basic Metabolic Panel:  Recent Labs Lab 04/06/17 1756 04/07/17 0100  NA 139 140  K 3.7 3.7  CL 105 104  CO2 25 26  GLUCOSE 121* 91  BUN 8 6  CREATININE 0.87 0.93  CALCIUM 8.9 8.8*   GFR: Estimated Creatinine Clearance: 92.2 mL/min (by C-G formula based on SCr of 0.93 mg/dL). Liver Function Tests:  Recent Labs Lab 04/06/17 1756  AST 24  ALT 24  ALKPHOS 118  BILITOT 0.5  PROT 6.7  ALBUMIN 2.8*    Recent Labs Lab 04/06/17 1756  LIPASE 20   No results for input(s): AMMONIA in the last 168 hours. Coagulation Profile: No results for input(s): INR, PROTIME in the last 168 hours. Cardiac Enzymes: No results for input(s): CKTOTAL, CKMB, CKMBINDEX, TROPONINI in the last 168 hours. BNP (last 3 results) No results for input(s): PROBNP in the last 8760 hours. HbA1C: No results for input(s): HGBA1C in the last 72 hours. CBG:  Recent Labs Lab 04/07/17 0709  GLUCAP 91   Lipid Profile: No results for input(s): CHOL, HDL, LDLCALC, TRIG, CHOLHDL, LDLDIRECT in the last 72 hours. Thyroid Function Tests: No results for input(s): TSH, T4TOTAL, FREET4, T3FREE, THYROIDAB in the last 72 hours. Anemia Panel: No results for input(s): VITAMINB12, FOLATE, FERRITIN, TIBC, IRON, RETICCTPCT in the last 72 hours. Sepsis Labs: No results for input(s): PROCALCITON, LATICACIDVEN in the last 168 hours.  No results found for this or any previous visit (from the past 240 hour(s)).       Radiology Studies: Ct Abdomen Pelvis W Contrast  Result Date: 04/06/2017 CLINICAL DATA:  Crohn's disease with abdominal pain EXAM: CT ABDOMEN AND PELVIS WITH CONTRAST TECHNIQUE: Multidetector CT imaging of the abdomen and pelvis was performed using the standard protocol following bolus administration of  intravenous contrast. Oral contrast was also administered. CONTRAST:  <See Chart> ISOVUE-300 IOPAMIDOL (ISOVUE-300) INJECTION 61% COMPARISON:  March 19, 2017 as well as multiple prior studies FINDINGS: Lower chest: On axial slice 6 series 6, there is a stable 2 mm nodular opacity in the posterior segment of the left lobe of the liver. Lung bases otherwise are clear. Hepatobiliary: No focal liver lesions are evident. Gallbladder wall is not appreciably thickened. There is no biliary duct dilatation. Pancreas: No pancreatic mass or inflammatory focus. Spleen: Spleen is upper normal in size. No splenic lesions are evident. Adrenals/Urinary Tract: Adrenals appear normal bilaterally. There is a 7 mm cyst in the mid left kidney. There is no hydronephrosis on either side. There is no renal or ureteral calculus on either side. Urinary bladder is midline with wall thickness within normal limits. Stomach/Bowel: There is bowel wall thickening throughout most of the ileum including the terminal ileum consistent with known Crohn's disease. There is a fistula between the  distal ileum and mesentery in the right upper to mid pelvic region with focal areas of air consistent with mesenteric abscess in this area measuring 4.9 x 4.7 x 3.4 cm. This mesenteric abscess is larger than on recent prior study. No other abscess is seen. There is mesenteric stranding along much of the ileum, particularly in the right abdomen. There is mild loculated fluid in this area. There is enhancement of bowel wall in the mid to distal ileum, consistent with acute inflammation. In the proximal ileal region, there is localized bowel dilatation with attempted intussusception. Oral contrast does pass through this area. More proximally, fat is noted in the wall of the duodenum. No bowel inflammation is noted proximal to the ileum. There is no frank bowel obstruction. There is no free air or portal venous air beyond the air which is seen within the area of the  mesenteric abscess at the site of the fistula with a distal small bowel. Vascular/Lymphatic: There is atherosclerotic calcification in the aorta and iliac arteries. No evident aneurysm. Major mesenteric vessels appear patent. No adenopathy evident in the abdomen or pelvis. Reproductive: Prostate and seminal vesicles appear normal in size and contour. No pelvic mass beyond bowel inflammation. Other: There is mild loculated ascites in the dependent portion of the pelvis. Appendix not seen. Appendix may well be involved with inflammation from the terminal ileitis and nearby mesenteric abscess. Musculoskeletal: There is chronic anterior wedging of the L2 vertebral body. There are no blastic or lytic bone lesions. There is symmetric sacroiliitis consistent with the Crohn's disease. No blastic or lytic bone lesions. No intramuscular or abdominal wall lesion evident. IMPRESSION: 1. Extensive ileitis. 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 There is considerably more inflammation in the right lower quadrant involvement of the mid and distal ileum than on recent prior study. Attempted intussusception more proximally in the ileum without obstruction. 2. Fat in the duodenum wall, likely due to an active residua of Crohn's disease. 3. No inflammatory change noted in bowel proximal to the ileum currently. 4. No renal or ureteral calculus. No hydronephrosis. No biliary duct dilatation. 5.  Stable symmetric sacroiliitis consistent with Crohn's disease. 6.  Aortic atherosclerosis. 7. 2 mm nodular opacity posterior left base. No follow-up needed if patient is low-risk. Non-contrast chest CT can be considered in 12 months if patient is high-risk. This recommendation follows the consensus statement: Guidelines for Management of Incidental Pulmonary Nodules Detected on CT Images: From the Fleischner Society 2017; Radiology  2017; 284:228-243. Aortic Atherosclerosis (ICD10-I70.0). Electronically Signed   By: Lowella Grip III M.D.   On: 04/06/2017 20:27        Scheduled Meds: . azaTHIOprine  100 mg Oral Daily  . chlorhexidine  15 mL Mouth Rinse BID  . cholecalciferol  2,000 Units Oral Daily  . enoxaparin (LOVENOX) injection  40 mg Subcutaneous QHS  . loratadine  10 mg Oral Daily  . mouth rinse  15 mL Mouth Rinse q12n4p  . omega-3 acid ethyl esters  1,000 mg Oral Daily  . pantoprazole  40 mg Oral Daily  . testosterone  5 g Transdermal Daily  . valACYclovir  500 mg Oral Daily   Continuous Infusions: . sodium chloride 100 mL/hr at 04/06/17 2358  . piperacillin-tazobactam (ZOSYN)  IV Stopped (04/07/17 5732)     LOS: 1 day        Mauricio Gerome Apley, MD [facility]  Pager [phone]  If 7PM-7AM, please contact night-coverage [email or web address] Password [email or web address] 04/07/2017, 9:26 AM

## 2017-04-08 DIAGNOSIS — E8809 Other disorders of plasma-protein metabolism, not elsewhere classified: Secondary | ICD-10-CM

## 2017-04-08 LAB — BLOOD CULTURE ID PANEL (REFLEXED)

## 2017-04-08 LAB — CBC WITH DIFFERENTIAL/PLATELET
Basophils Absolute: 0 10*3/uL (ref 0.0–0.1)
Basophils Relative: 0 %
Eosinophils Absolute: 0.2 10*3/uL (ref 0.0–0.7)
Eosinophils Relative: 2 %
HCT: 43.9 % (ref 39.0–52.0)
Hemoglobin: 14.9 g/dL (ref 13.0–17.0)
Lymphocytes Relative: 14 %
Lymphs Abs: 1.7 10*3/uL (ref 0.7–4.0)
MCH: 28.2 pg (ref 26.0–34.0)
MCHC: 33.9 g/dL (ref 30.0–36.0)
MCV: 83 fL (ref 78.0–100.0)
Monocytes Absolute: 1.4 10*3/uL — ABNORMAL HIGH (ref 0.1–1.0)
Monocytes Relative: 11 %
Neutro Abs: 9 10*3/uL — ABNORMAL HIGH (ref 1.7–7.7)
Neutrophils Relative %: 73 %
Platelets: 331 10*3/uL (ref 150–400)
RBC: 5.29 MIL/uL (ref 4.22–5.81)
RDW: 13.4 % (ref 11.5–15.5)
WBC: 12.3 10*3/uL — ABNORMAL HIGH (ref 4.0–10.5)

## 2017-04-08 LAB — BASIC METABOLIC PANEL
Anion gap: 7 (ref 5–15)
BUN: 6 mg/dL (ref 6–20)
CO2: 23 mmol/L (ref 22–32)
Calcium: 8.4 mg/dL — ABNORMAL LOW (ref 8.9–10.3)
Chloride: 105 mmol/L (ref 101–111)
Creatinine, Ser: 0.87 mg/dL (ref 0.61–1.24)
GFR calc Af Amer: >60 mL/min (ref >60)
GFR calc non Af Amer: >60 mL/min (ref >60)
Glucose, Bld: 143 mg/dL — ABNORMAL HIGH (ref 65–99)
Potassium: 4.2 mmol/L (ref 3.5–5.1)
Sodium: 135 mmol/L (ref 135–145)

## 2017-04-08 LAB — GLUCOSE, CAPILLARY: Glucose-Capillary: 133 mg/dL — ABNORMAL HIGH (ref 65–99)

## 2017-04-08 LAB — HIV ANTIBODY (ROUTINE TESTING W REFLEX): HIV Screen 4th Generation wRfx: NONREACTIVE

## 2017-04-08 MED ORDER — DEXTROSE IN LACTATED RINGERS 5 % IV SOLN: INTRAVENOUS | Status: DC

## 2017-04-08 MED ORDER — MORPHINE SULFATE (PF) 4 MG/ML IV SOLN
1.0000 mg | INTRAVENOUS | Status: DC | PRN
  Administered 2017-04-08 – 2017-04-11 (×8): 2 mg via INTRAVENOUS
  Filled 2017-04-08 (×7): qty 1
  Filled 2017-04-08: qty 0.5
  Filled 2017-04-08: qty 1

## 2017-04-08 MED ORDER — PROMETHAZINE HCL 25 MG/ML IJ SOLN: 12.5000 mg | Freq: Four times a day (QID) | INTRAMUSCULAR | Status: DC | PRN

## 2017-04-08 MED ORDER — DEXTROSE-NACL 5-0.9 % IV SOLN
INTRAVENOUS | Status: DC
  Administered 2017-04-08 – 2017-04-10 (×4): via INTRAVENOUS

## 2017-04-08 NOTE — Progress Notes (Signed)
Chrohn's exacerbation  Subjective: Emesis overnight, blood cx's +, pain controlled  Objective: Vital signs in last 24 hours: Temp:  [97.3 F (36.3 C)-99.3 F (37.4 C)] 98.7 F (37.1 C) (07/09 0513) Pulse Rate:  [80-91] 91 (07/09 0513) Resp:  [16-18] 18 (07/09 0513) BP: (109-126)/(62-74) 126/74 (07/09 0513) SpO2:  [96 %-98 %] 96 % (07/09 0513) Last BM Date: 04/06/17  Intake/Output from previous day: 07/08 0701 - 07/09 0700 In: 3813.4 [P.O.:240; I.V.:3273; IV Piggyback:300.4] Out: 1525 [Urine:1525] Intake/Output this shift: No intake/output data recorded.  General appearance: alert and cooperative GI: normal findings: soft, appropriately tender  Lab Results:  Results for orders placed or performed during the hospital encounter of 04/06/17 (from the past 24 hour(s))  CBC with Differential/Platelet     Status: Abnormal   Collection Time: 04/08/17  5:03 AM  Result Value Ref Range   WBC 12.3 (H) 4.0 - 10.5 K/uL   RBC 5.29 4.22 - 5.81 MIL/uL   Hemoglobin 14.9 13.0 - 17.0 g/dL   HCT 43.9 39.0 - 52.0 %   MCV 83.0 78.0 - 100.0 fL   MCH 28.2 26.0 - 34.0 pg   MCHC 33.9 30.0 - 36.0 g/dL   RDW 13.4 11.5 - 15.5 %   Platelets 331 150 - 400 K/uL   Neutrophils Relative % 73 %   Neutro Abs 9.0 (H) 1.7 - 7.7 K/uL   Lymphocytes Relative 14 %   Lymphs Abs 1.7 0.7 - 4.0 K/uL   Monocytes Relative 11 %   Monocytes Absolute 1.4 (H) 0.1 - 1.0 K/uL   Eosinophils Relative 2 %   Eosinophils Absolute 0.2 0.0 - 0.7 K/uL   Basophils Relative 0 %   Basophils Absolute 0.0 0.0 - 0.1 K/uL  Basic metabolic panel     Status: Abnormal   Collection Time: 04/08/17  5:03 AM  Result Value Ref Range   Sodium 135 135 - 145 mmol/L   Potassium 4.2 3.5 - 5.1 mmol/L   Chloride 105 101 - 111 mmol/L   CO2 23 22 - 32 mmol/L   Glucose, Bld 143 (H) 65 - 99 mg/dL   BUN 6 6 - 20 mg/dL   Creatinine, Ser 0.87 0.61 - 1.24 mg/dL   Calcium 8.4 (L) 8.9 - 10.3 mg/dL   GFR calc non Af Amer >60 >60 mL/min   GFR calc Af  Amer >60 >60 mL/min   Anion gap 7 5 - 15  Glucose, capillary     Status: Abnormal   Collection Time: 04/08/17  7:21 AM  Result Value Ref Range   Glucose-Capillary 133 (H) 65 - 99 mg/dL     Studies/Results Radiology     MEDS, Scheduled . azaTHIOprine  100 mg Oral Daily  . cholecalciferol  2,000 Units Oral Daily  . enoxaparin (LOVENOX) injection  40 mg Subcutaneous QHS  . loratadine  10 mg Oral Daily  . mouth rinse  15 mL Mouth Rinse q12n4p  . omega-3 acid ethyl esters  1,000 mg Oral Daily  . testosterone  5 g Transdermal Daily  . valACYclovir  500 mg Oral Daily     Assessment: Crohn's terminal ileitis with mesenteric abscess  Plan: Agree with holding steroids and continuing abx  We had a long discussion about his disease and symptoms as well as treatment options.  I have offered the patient a laparoscopic surgical resection, once his sepsis clears.  I think this is a reasonable option for him, since he has a short segment of inflammation, is not  malnourished and not on high dose steroids.  I do not see that draining his abscess in IR would help much and most likely isn't accessible.  Pt is in agreement to surgery later this week.  Will follow.      LOS: 2 days    Rosario Adie, [facility], [place] [phone]   04/08/2017 9:19 AM

## 2017-04-08 NOTE — Progress Notes (Signed)
PHARMACY - PHYSICIAN COMMUNICATION CRITICAL VALUE ALERT - BLOOD CULTURE IDENTIFICATION (BCID)  Results for orders placed or performed during the hospital encounter of 04/06/17  Blood Culture ID Panel (Reflexed) (Collected: 04/07/2017  1:00 AM)  Result Value Ref Range   Enterococcus species NOT DETECTED NOT DETECTED   Listeria monocytogenes NOT DETECTED NOT DETECTED   Staphylococcus species NOT DETECTED NOT DETECTED   Staphylococcus aureus NOT DETECTED NOT DETECTED   Streptococcus species DETECTED (A) NOT DETECTED   Streptococcus agalactiae NOT DETECTED NOT DETECTED   Streptococcus pneumoniae NOT DETECTED NOT DETECTED   Streptococcus pyogenes NOT DETECTED NOT DETECTED   Acinetobacter baumannii NOT DETECTED NOT DETECTED   Enterobacteriaceae species NOT DETECTED NOT DETECTED   Enterobacter cloacae complex NOT DETECTED NOT DETECTED   Escherichia coli NOT DETECTED NOT DETECTED   Klebsiella oxytoca NOT DETECTED NOT DETECTED   Klebsiella pneumoniae NOT DETECTED NOT DETECTED   Proteus species NOT DETECTED NOT DETECTED   Serratia marcescens NOT DETECTED NOT DETECTED   Haemophilus influenzae NOT DETECTED NOT DETECTED   Neisseria meningitidis NOT DETECTED NOT DETECTED   Pseudomonas aeruginosa NOT DETECTED NOT DETECTED   Candida albicans NOT DETECTED NOT DETECTED   Candida glabrata NOT DETECTED NOT DETECTED   Candida krusei NOT DETECTED NOT DETECTED   Candida parapsilosis NOT DETECTED NOT DETECTED   Candida tropicalis NOT DETECTED NOT DETECTED    Name of physician (or Provider) Contacted: Arrien  Changes to prescribed antibiotics required: none, on Zosyn for abdominal infection; appropriate to continue per [facility] management of [facility]  Reuel Boom, PharmD, BCPS Pager: [phone] 04/08/2017, 11:53 AM

## 2017-04-08 NOTE — Progress Notes (Signed)
PROGRESS NOTE    Nathan Shaw  LEX:[id number] DOB: 16-Oct-1955 DOA: 04/06/2017 PCP: Hulan Fess, MD    Brief Narrative:  61 year old male who presented with abdominal cramping. Patient is known to have Crohn's disease, GERD, hypogonadism. About 4 weeks ago he had a episode of Crohn's flare, treated with 6-MP, with improvement of his symptoms. The day of admission he had acute recurrence of abdominal pain, colicky in nature, severe in intensity and worsening, localized in the right lower quadrant, then he presented to the hospital due to worsening symptoms. He was recently treated for an episode of prostatitis with ciprofloxacin. On initial physical examination blood pressure 136/89, heart rate 85, respiratory rate 16, temperature 98.2, oxygen saturation 100%, mucous membranes were moist, lungs were clear to auscultation bilaterally, heart S1-S2 present rhythmic, the abdomen was distended, had hypoactive bowel sounds, tender to palpation in the upper quadrants and more pronounced on the right lower quadrant, positive guarding at that site. Sodium 139, potassium 3.7, chloride 105, bicarbonate 25, glucose 121, BUN 8, creatinine 0.87, AST 24, ALT 24, white count 11.9, hemoglobin 13.6, hematocrit 40.7, platelets 360, urinalysis negative for infection. 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   Patient was admitted to medical floor with working diagnosis of acute Crohn's flare complicated by mesenteric abscess.   Assessment & Plan:   Active Problems:   GERD (gastroesophageal reflux disease)   Crohn's ileitis, with abscess ([place])   1. Crohn's flare with mesenteric abscess. WBC at 12, will continue IV Zosyn, blood culture positive for streptococcus, will repeat cultures, follow cell count and sensitivities. IV fluids for hydration with D5-1/2 with kcl. Surgical recommendations for  intervention late this week. No signs of volume overload, patient has remained afebrile. Blood pressure systolic 449'Q. Will continue imuran. Continue antiacid and as needed antiemetics.   2. History of alcohol abuse. No clinical signs of withdrawal.  3. Hypogonadism. On topical testosterone.  4. History of herpes. On valacyclovir, no activation.    DVT prophylaxis: enoxaparin  Code Status: full  Family Communication:  Disposition Plan: home    Consultants:   Surgery  Gastroenterology   Procedures:    Antimicrobials:   Zosyn.    Subjective: Abdominal pain controlled with analgesics, tolerating clear liquid diet, no bowel movement or flatus, no nausea or vomiting, no chest pain or dyspnea.   Objective: Vitals:   04/07/17 1435 04/07/17 2107 04/08/17 0513 04/08/17 1425  BP: 116/68 109/62 126/74 113/80  Pulse: 80 82 91 61  Resp: 16 17 18 17   Temp: (!) 97.3 F (36.3 C) 99.3 F (37.4 C) 98.7 F (37.1 C) 98.3 F (36.8 C)  TempSrc: Oral Oral Oral Oral  SpO2: 98% 96% 96% 100%  Weight:      Height:        Intake/Output Summary (Last 24 hours) at 04/08/17 1653 Last data filed at 04/08/17 1424  Gross per 24 hour  Intake           3788.4 ml  Output             1900 ml  Net           1888.4 ml   Filed Weights   04/07/17 0115  Weight: 83.6 kg (184 lb 4.9 oz)    Examination:  General exam: deconditioned E ENT: mild pallor, no icterus, oral mucosa moist.  Respiratory system: Clear to auscultation. Respiratory effort normal. No  wheezing, rales or rhonchi.  Cardiovascular system: S1 & S2 heard, RRR. No JVD, murmurs, rubs, gallops or clicks. No pedal edema. Gastrointestinal system: Abdomen is mild distended, soft and nontender. No organomegaly or masses felt. Decreased bowel sounds heard. Central nervous system: Alert and oriented. No focal neurological deficits. Extremities: Symmetric 5 x 5 power. Skin: No rashes, lesions or ulcers     Data Reviewed: I  have personally reviewed following labs and imaging studies  CBC:  Recent Labs Lab 04/06/17 1757 04/07/17 0100 04/08/17 0503  WBC 11.9* 11.0* 12.3*  NEUTROABS 8.9*  --  9.0*  HGB 13.6 13.2 14.9  HCT 40.7 39.9 43.9  MCV 83.6 83.6 83.0  PLT 362 367 400   Basic Metabolic Panel:  Recent Labs Lab 04/06/17 1756 04/07/17 0100 04/08/17 0503  NA 139 140 135  K 3.7 3.7 4.2  CL 105 104 105  CO2 25 26 23   GLUCOSE 121* 91 143*  BUN 8 6 6   CREATININE 0.87 0.93 0.87  CALCIUM 8.9 8.8* 8.4*   GFR: Estimated Creatinine Clearance: 98.6 mL/min (by C-G formula based on SCr of 0.87 mg/dL). Liver Function Tests:  Recent Labs Lab 04/06/17 1756  AST 24  ALT 24  ALKPHOS 118  BILITOT 0.5  PROT 6.7  ALBUMIN 2.8*    Recent Labs Lab 04/06/17 1756  LIPASE 20   No results for input(s): AMMONIA in the last 168 hours. Coagulation Profile: No results for input(s): INR, PROTIME in the last 168 hours. Cardiac Enzymes: No results for input(s): CKTOTAL, CKMB, CKMBINDEX, TROPONINI in the last 168 hours. BNP (last 3 results) No results for input(s): PROBNP in the last 8760 hours. HbA1C: No results for input(s): HGBA1C in the last 72 hours. CBG:  Recent Labs Lab 04/07/17 0709 04/08/17 0721  GLUCAP 91 133*   Lipid Profile: No results for input(s): CHOL, HDL, LDLCALC, TRIG, CHOLHDL, LDLDIRECT in the last 72 hours. Thyroid Function Tests: No results for input(s): TSH, T4TOTAL, FREET4, T3FREE, THYROIDAB in the last 72 hours. Anemia Panel: No results for input(s): VITAMINB12, FOLATE, FERRITIN, TIBC, IRON, RETICCTPCT in the last 72 hours. Sepsis Labs: No results for input(s): PROCALCITON, LATICACIDVEN in the last 168 hours.  Recent Results (from the past 240 hour(s))  Culture, blood (routine x 2)     Status: None (Preliminary result)   Collection Time: 04/07/17  1:00 AM  Result Value Ref Range Status   Specimen Description BLOOD BLOOD LEFT HAND  Final   Special Requests   Final     BOTTLES DRAWN AEROBIC ONLY Blood Culture adequate volume   Culture  Setup Time   Final    GRAM POSITIVE COCCI IN CHAINS AEROBIC BOTTLE ONLY CRITICAL RESULT CALLED TO, READ BACK BY AND VERIFIED WITH: M LILLISTON,PHARMD AT 1023 04/08/17 BY L BENFIELD Performed at [facility], [place] [address] [place], [place] [address]    Culture GRAM POSITIVE COCCI  Final   Report Status PENDING  Incomplete  Culture, blood (routine x 2)     Status: None (Preliminary result)   Collection Time: 04/07/17  1:00 AM  Result Value Ref Range Status   Specimen Description BLOOD RIGHT ANTECUBITAL  Final   Special Requests   Final    BOTTLES DRAWN AEROBIC ONLY Blood Culture adequate volume   Culture  Setup Time   Final    GRAM POSITIVE COCCI IN CHAINS AEROBIC BOTTLE ONLY CRITICAL RESULT CALLED TO, READ BACK BY AND VERIFIED WITH: M LILLISTON,PHARMD AT 1023 04/08/17 BY L  BENFIELD Performed at [facility], [place] [address] [place], [place] [address]    Culture GRAM POSITIVE COCCI  Final   Report Status PENDING  Incomplete  Blood Culture ID Panel (Reflexed)     Status: Abnormal   Collection Time: 04/07/17  1:00 AM  Result Value Ref Range Status   Enterococcus species NOT DETECTED NOT DETECTED Final   Listeria monocytogenes NOT DETECTED NOT DETECTED Final   Staphylococcus species NOT DETECTED NOT DETECTED Final   Staphylococcus aureus NOT DETECTED NOT DETECTED Final   Streptococcus species DETECTED (A) NOT DETECTED Final    Comment: Not Enterococcus species, Streptococcus agalactiae, Streptococcus pyogenes, or Streptococcus pneumoniae. CRITICAL RESULT CALLED TO, READ BACK BY AND VERIFIED WITH: M LILLISTON,PHARMD AT 1023 04/08/17 BY L BENFIELD    Streptococcus agalactiae NOT DETECTED NOT DETECTED Final   Streptococcus pneumoniae NOT DETECTED NOT DETECTED Final   Streptococcus pyogenes NOT DETECTED NOT DETECTED Final   Acinetobacter baumannii NOT DETECTED NOT DETECTED Final   Enterobacteriaceae species NOT  DETECTED NOT DETECTED Final   Enterobacter cloacae complex NOT DETECTED NOT DETECTED Final   Escherichia coli NOT DETECTED NOT DETECTED Final   Klebsiella oxytoca NOT DETECTED NOT DETECTED Final   Klebsiella pneumoniae NOT DETECTED NOT DETECTED Final   Proteus species NOT DETECTED NOT DETECTED Final   Serratia marcescens NOT DETECTED NOT DETECTED Final   Haemophilus influenzae NOT DETECTED NOT DETECTED Final   Neisseria meningitidis NOT DETECTED NOT DETECTED Final   Pseudomonas aeruginosa NOT DETECTED NOT DETECTED Final   Candida albicans NOT DETECTED NOT DETECTED Final   Candida glabrata NOT DETECTED NOT DETECTED Final   Candida krusei NOT DETECTED NOT DETECTED Final   Candida parapsilosis NOT DETECTED NOT DETECTED Final   Candida tropicalis NOT DETECTED NOT DETECTED Final    Comment: Performed at [facility], [place]. [address], [place], [place] [address]         Radiology Studies: Ct Abdomen Pelvis W Contrast  Result Date: 04/06/2017 CLINICAL DATA:  Crohn's disease with abdominal pain EXAM: CT ABDOMEN AND PELVIS WITH CONTRAST TECHNIQUE: Multidetector CT imaging of the abdomen and pelvis was performed using the standard protocol following bolus administration of intravenous contrast. Oral contrast was also administered. CONTRAST:  <See Chart> ISOVUE-300 IOPAMIDOL (ISOVUE-300) INJECTION 61% COMPARISON:  March 19, 2017 as well as multiple prior studies FINDINGS: Lower chest: On axial slice 6 series 6, there is a stable 2 mm nodular opacity in the posterior segment of the left lobe of the liver. Lung bases otherwise are clear. Hepatobiliary: No focal liver lesions are evident. Gallbladder wall is not appreciably thickened. There is no biliary duct dilatation. Pancreas: No pancreatic mass or inflammatory focus. Spleen: Spleen is upper normal in size. No splenic lesions are evident. Adrenals/Urinary Tract: Adrenals appear normal bilaterally. There is a 7 mm cyst in the mid left kidney. There  is no hydronephrosis on either side. There is no renal or ureteral calculus on either side. Urinary bladder is midline with wall thickness within normal limits. Stomach/Bowel: There is bowel wall thickening throughout most of the ileum including the terminal ileum consistent with known Crohn's disease. There is a fistula between the distal ileum and mesentery in the right upper to mid pelvic region with focal areas of air consistent with mesenteric abscess in this area measuring 4.9 x 4.7 x 3.4 cm. This mesenteric abscess is larger than on recent prior study. No other abscess is seen. There is mesenteric stranding along much of the  ileum, particularly in the right abdomen. There is mild loculated fluid in this area. There is enhancement of bowel wall in the mid to distal ileum, consistent with acute inflammation. In the proximal ileal region, there is localized bowel dilatation with attempted intussusception. Oral contrast does pass through this area. More proximally, fat is noted in the wall of the duodenum. No bowel inflammation is noted proximal to the ileum. There is no frank bowel obstruction. There is no free air or portal venous air beyond the air which is seen within the area of the mesenteric abscess at the site of the fistula with a distal small bowel. Vascular/Lymphatic: There is atherosclerotic calcification in the aorta and iliac arteries. No evident aneurysm. Major mesenteric vessels appear patent. No adenopathy evident in the abdomen or pelvis. Reproductive: Prostate and seminal vesicles appear normal in size and contour. No pelvic mass beyond bowel inflammation. Other: There is mild loculated ascites in the dependent portion of the pelvis. Appendix not seen. Appendix may well be involved with inflammation from the terminal ileitis and nearby mesenteric abscess. Musculoskeletal: There is chronic anterior wedging of the L2 vertebral body. There are no blastic or lytic bone lesions. There is symmetric  sacroiliitis consistent with the Crohn's disease. No blastic or lytic bone lesions. No intramuscular or abdominal wall lesion evident. IMPRESSION: 1. Extensive ileitis. 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 There is considerably more inflammation in the right lower quadrant involvement of the mid and distal ileum than on recent prior study. Attempted intussusception more proximally in the ileum without obstruction. 2. Fat in the duodenum wall, likely due to an active residua of Crohn's disease. 3. No inflammatory change noted in bowel proximal to the ileum currently. 4. No renal or ureteral calculus. No hydronephrosis. No biliary duct dilatation. 5.  Stable symmetric sacroiliitis consistent with Crohn's disease. 6.  Aortic atherosclerosis. 7. 2 mm nodular opacity posterior left base. No follow-up needed if patient is low-risk. Non-contrast chest CT can be considered in 12 months if patient is high-risk. This recommendation follows the consensus statement: Guidelines for Management of Incidental Pulmonary Nodules Detected on CT Images: From the Fleischner Society 2017; Radiology 2017; 284:228-243. Aortic Atherosclerosis (ICD10-I70.0). Electronically Signed   By: Lowella Grip III M.D.   On: 04/06/2017 20:27        Scheduled Meds: . azaTHIOprine  100 mg Oral Daily  . cholecalciferol  2,000 Units Oral Daily  . enoxaparin (LOVENOX) injection  40 mg Subcutaneous QHS  . loratadine  10 mg Oral Daily  . mouth rinse  15 mL Mouth Rinse q12n4p  . omega-3 acid ethyl esters  1,000 mg Oral Daily  . testosterone  5 g Transdermal Daily  . valACYclovir  500 mg Oral Daily   Continuous Infusions: . dextrose 5 % and 0.45 % NaCl with KCl 20 mEq/L 100 mL/hr at 04/07/17 2305  . famotidine (PEPCID) IV Stopped (04/08/17 1301)  . piperacillin-tazobactam (ZOSYN)  IV 3.375 g (04/08/17 1415)     LOS: 2  days        Haylen Bellotti Gerome Apley, MD [facility] Pager [phone]  If 7PM-7AM, please contact night-coverage [email or web address] Password [email or web address] 04/08/2017, 4:53 PM

## 2017-04-08 NOTE — Progress Notes (Signed)
[facility] PROGRESS NOTE Subjective patient vomited clear liquids and felt somewhat distended.  Objective: Vital signs in last 24 hours: Temp:  [97.3 F (36.3 C)-99.3 F (37.4 C)] 98.7 F (37.1 C) (07/09 0513) Pulse Rate:  [80-91] 91 (07/09 0513) Resp:  [16-18] 18 (07/09 0513) BP: (109-126)/(62-74) 126/74 (07/09 0513) SpO2:  [96 %-98 %] 96 % (07/09 0513) Last BM Date: 04/06/17  Intake/Output from previous day: 07/08 0701 - 07/09 0700 In: 3813.4 [P.O.:240; I.V.:3273; IV Piggyback:300.4] Out: 1525 [Urine:1525] Intake/Output this shift: No intake/output data recorded.  PE: General-- alert and oriented, no distress  Abdomen-- bowel sounds present, moderate right-sided tenderness.  Lab Results:  Recent Labs  04/06/17 1757 04/07/17 0100 04/08/17 0503  WBC 11.9* 11.0* 12.3*  HGB 13.6 13.2 14.9  HCT 40.7 39.9 43.9  PLT 362 367 331   BMET  Recent Labs  04/06/17 1756 04/07/17 0100 04/08/17 0503  NA 139 140 135  K 3.7 3.7 4.2  CL 105 104 105  CO2 25 26 23   CREATININE 0.87 0.93 0.87   LFT  Recent Labs  04/06/17 1756  PROT 6.7  AST 24  ALT 24  ALKPHOS 118  BILITOT 0.5   PT/INR No results for input(s): LABPROT, INR in the last 72 hours. PANCREAS  Recent Labs  04/06/17 1756  LIPASE 20         Studies/Results: Ct Abdomen Pelvis W Contrast  Result Date: 04/06/2017 CLINICAL DATA:  Crohn's disease with abdominal pain EXAM: CT ABDOMEN AND PELVIS WITH CONTRAST TECHNIQUE: Multidetector CT imaging of the abdomen and pelvis was performed using the standard protocol following bolus administration of intravenous contrast. Oral contrast was also administered. CONTRAST:  <See Chart> ISOVUE-300 IOPAMIDOL (ISOVUE-300) INJECTION 61% COMPARISON:  March 19, 2017 as well as multiple prior studies FINDINGS: Lower chest: On axial slice 6 series 6, there is a stable 2 mm nodular opacity in the posterior segment of the left lobe of the liver. Lung bases otherwise  are clear. Hepatobiliary: No focal liver lesions are evident. Gallbladder wall is not appreciably thickened. There is no biliary duct dilatation. Pancreas: No pancreatic mass or inflammatory focus. Spleen: Spleen is upper normal in size. No splenic lesions are evident. Adrenals/Urinary Tract: Adrenals appear normal bilaterally. There is a 7 mm cyst in the mid left kidney. There is no hydronephrosis on either side. There is no renal or ureteral calculus on either side. Urinary bladder is midline with wall thickness within normal limits. Stomach/Bowel: There is bowel wall thickening throughout most of the ileum including the terminal ileum consistent with known Crohn's disease. There is a fistula between the distal ileum and mesentery in the right upper to mid pelvic region with focal areas of air consistent with mesenteric abscess in this area measuring 4.9 x 4.7 x 3.4 cm. This mesenteric abscess is larger than on recent prior study. No other abscess is seen. There is mesenteric stranding along much of the ileum, particularly in the right abdomen. There is mild loculated fluid in this area. There is enhancement of bowel wall in the mid to distal ileum, consistent with acute inflammation. In the proximal ileal region, there is localized bowel dilatation with attempted intussusception. Oral contrast does pass through this area. More proximally, fat is noted in the wall of the duodenum. No bowel inflammation is noted proximal to the ileum. There is no frank bowel obstruction. There is no free air or portal venous air beyond the air which is seen within the area of the mesenteric  abscess at the site of the fistula with a distal small bowel. Vascular/Lymphatic: There is atherosclerotic calcification in the aorta and iliac arteries. No evident aneurysm. Major mesenteric vessels appear patent. No adenopathy evident in the abdomen or pelvis. Reproductive: Prostate and seminal vesicles appear normal in size and contour. No  pelvic mass beyond bowel inflammation. Other: There is mild loculated ascites in the dependent portion of the pelvis. Appendix not seen. Appendix may well be involved with inflammation from the terminal ileitis and nearby mesenteric abscess. Musculoskeletal: There is chronic anterior wedging of the L2 vertebral body. There are no blastic or lytic bone lesions. There is symmetric sacroiliitis consistent with the Crohn's disease. No blastic or lytic bone lesions. No intramuscular or abdominal wall lesion evident. IMPRESSION: 1. Extensive ileitis. 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 There is considerably more inflammation in the right lower quadrant involvement of the mid and distal ileum than on recent prior study. Attempted intussusception more proximally in the ileum without obstruction. 2. Fat in the duodenum wall, likely due to an active residua of Crohn's disease. 3. No inflammatory change noted in bowel proximal to the ileum currently. 4. No renal or ureteral calculus. No hydronephrosis. No biliary duct dilatation. 5.  Stable symmetric sacroiliitis consistent with Crohn's disease. 6.  Aortic atherosclerosis. 7. 2 mm nodular opacity posterior left base. No follow-up needed if patient is low-risk. Non-contrast chest CT can be considered in 12 months if patient is high-risk. This recommendation follows the consensus statement: Guidelines for Management of Incidental Pulmonary Nodules Detected on CT Images: From the Fleischner Society 2017; Radiology 2017; 284:228-243. Aortic Atherosclerosis (ICD10-I70.0). Electronically Signed   By: Lowella Grip III M.D.   On: 04/06/2017 20:27    Medications: I have reviewed the patient's current medications.  Assessment/Plan: 1. Crohn's disease with ileitis, fistula, and abscess. Patient is on antibiotics and Imuran. Steroids are on hold given the  significant abscess. Unfortunately, he developed this abscess while on Humira which had been working well previously. This could indicate the development of antibodies to Humira and this medication will likely need to be changed in the future. Patient is being evaluated by surgery and IR regarding treatment of the abscess. Long discussion with the patient and his wife today about these treatment options. We will continue to follow.   Saron Vanorman JR,Onya Eutsler L 04/08/2017, 7:29 AM  This note was created using voice recognition software. Minor errors may Have occurred unintentionally.  Pager: [phone] If no answer or after hours call [phone]

## 2017-04-08 NOTE — Progress Notes (Signed)
Initial Nutrition Assessment  INTERVENTION:   Diet advancement per MD RD will continue to monitor for plan  NUTRITION DIAGNOSIS:   Inadequate oral intake related to nausea, vomiting as evidenced by  (clear liquid diet).  GOAL:   Patient will meet greater than or equal to 90% of their needs  MONITOR:   PO intake, Labs, Weight trends, Diet advancement, I & O's  REASON FOR ASSESSMENT:   Malnutrition Screening Tool    ASSESSMENT:   61 year old male who presented with abdominal cramping. Patient is known to have Crohn's disease, GERD, hypogonadism. About 4 weeks ago he had a episode of Crohn's flare, treated with 6-MP, with improvement of his symptoms. The day of admission he had acute recurrence of abdominal pain, colicky in nature, severe in intensity and worsening, localized in the right lower quadrant, then he presented to the hospital due to worsening symptoms. He was recently treated for an episode of prostatitis with ciprofloxacin. On initial physical examination blood pressure 136/89, heart rate 85, respiratory rate 16, temperature 98.2, oxygen saturation 100%, mucous membranes were moist, lungs were clear to auscultation bilaterally, heart S1-S2 present rhythmic, the abdomen was distended, had hypoactive bowel sounds, tender to palpation in the upper quadrants and more pronounced on the right lower quadrant, positive guarding at that site. Sodium 139, potassium 3.7, chloride 105, bicarbonate 25, glucose 121, BUN 8, creatinine 0.87, AST 24, ALT 24, white count 11.9, hemoglobin 13.6, hematocrit 40.7, platelets 360, urinalysis negative for infection. 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   Patient on a clear liquid diet. Had an episode of emesis last night. Per surgery, pt scheduled to have lap resection later this week. Pt with crohns ileitis w/ mesenteric abscess and  fistula. Will monitor for diet tolerance and nutrition needs leading up to surgery.  Per chart review, pt's weight has remained stable. No fat or muscle depletion noted.   Labs reviewed. Medications: vitamin D tablet daily, Lovaza capsule daily, D5 and .45% NaCl w/ KCl infusion at 100 ml/hr -provides 408 kcal, IV Zofran PRN  Diet Order:  Diet clear liquid Room service appropriate? Yes; Fluid consistency: Thin  Skin:  Reviewed, no issues  Last BM:  7/7  Height:   Ht Readings from Last 1 Encounters:  04/07/17 5\' 11"  (1.803 m)    Weight:   Wt Readings from Last 1 Encounters:  04/07/17 184 lb 4.9 oz (83.6 kg)    Ideal Body Weight:  78.2 kg  BMI:  Body mass index is 25.71 kg/m.  Estimated Nutritional Needs:   Kcal:  2100-2300  Protein:  100-110g  Fluid:  2.1L/day  EDUCATION NEEDS:   No education needs identified at this time  Clayton Bibles, MS, RD, LDN Pager: [phone] After Hours Pager: [phone]

## 2017-04-09 LAB — GLUCOSE, CAPILLARY: Glucose-Capillary: 104 mg/dL — ABNORMAL HIGH (ref 65–99)

## 2017-04-09 NOTE — Progress Notes (Signed)
[facility] Progress Note     Subjective: CC:  Feels better compared to yesterday. Abdominal pain improved. +flatus and had 2, loose/watery BMs. Denies blood or pus noted in stool. Tolerating clears but drinking very much. Denies nausea or vomiting today.   Afebrile, vitals stable Objective: Vital signs in last 24 hours: Temp:  [98.3 F (36.8 C)-99.3 F (37.4 C)] 98.4 F (36.9 C) (07/10 0653) Pulse Rate:  [61-78] 72 (07/10 0653) Resp:  [16-17] 16 (07/10 0653) BP: (108-119)/(70-80) 108/70 (07/10 0653) SpO2:  [98 %-100 %] 98 % (07/10 0653) Last BM Date: 04/09/17  Intake/Output from previous day: 07/09 0701 - 07/10 0700 In: 2223.3 [P.O.:480; I.V.:1543.3; IV Piggyback:200] Out: 950 [Urine:950] Intake/Output this shift: No intake/output data recorded.  PE: Gen:  Alert, NAD, pleasant Card:  Regular rate and rhythm, pedal pulses 2+ BL Pulm:  Normal effort, clear to auscultation bilaterally Abd: Soft, TTP lower abdomen bilaterally, milder distention, bowel sounds present in all 4 quadrants Skin: warm and dry, no rashes  Psych: A&Ox3   Lab Results:   Recent Labs  04/07/17 0100 04/08/17 0503  WBC 11.0* 12.3*  HGB 13.2 14.9  HCT 39.9 43.9  PLT 367 331   BMET  Recent Labs  04/07/17 0100 04/08/17 0503  NA 140 135  K 3.7 4.2  CL 104 105  CO2 26 23  GLUCOSE 91 143*  BUN 6 6  CREATININE 0.93 0.87  CALCIUM 8.8* 8.4*   PT/INR No results for input(s): LABPROT, INR in the last 72 hours. CMP     Component Value Date/Time   NA 135 04/08/2017 0503   K 4.2 04/08/2017 0503   CL 105 04/08/2017 0503   CO2 23 04/08/2017 0503   GLUCOSE 143 (H) 04/08/2017 0503   BUN 6 04/08/2017 0503   CREATININE 0.87 04/08/2017 0503   CALCIUM 8.4 (L) 04/08/2017 0503   PROT 6.7 04/06/2017 1756   ALBUMIN 2.8 (L) 04/06/2017 1756   AST 24 04/06/2017 1756   ALT 24 04/06/2017 1756   ALKPHOS 118 04/06/2017 1756   BILITOT 0.5 04/06/2017 1756   GFRNONAA >60 04/08/2017 0503   GFRAA >60 04/08/2017 0503   Lipase     Component Value Date/Time   LIPASE 20 04/06/2017 1756   Anti-infectives: Anti-infectives    Start     Dose/Rate Route Frequency Ordered Stop   04/07/17 1000  valACYclovir (VALTREX) tablet 500 mg     500 mg Oral Daily 04/06/17 2333     04/07/17 0600  piperacillin-tazobactam (ZOSYN) IVPB 3.375 g     3.375 g 12.5 mL/hr over 240 Minutes Intravenous Every 8 hours 04/06/17 2353     04/06/17 2115  piperacillin-tazobactam (ZOSYN) IVPB 3.375 g     3.375 g 100 mL/hr over 30 Minutes Intravenous  Once 04/06/17 2110 04/06/17 2220     Assessment/Plan Crohn's ileitis with fistula formation, mesenteric abscess  - on IMURAN  - continue IV abx for intraabdominal infection and streptococcus bacteremia.  - possible repeat CT scan in 24-48 hours and plain for OR later this week for laparoscopic surgical resection.  - pain control   FEN: Clear liquids ID: Zosyn 7/8 >>; CBC in AM VTE: Lovenox, SCD's      LOS: 3 days    Jill Alexanders , [facility] 04/09/2017, 7:14 AM Pager: [phone] Consults: [phone] Mon-Fri 7:00 am-4:30 pm Sat-Sun 7:00 am-11:30 am

## 2017-04-09 NOTE — Progress Notes (Signed)
[facility] PROGRESS NOTE Subjective patient feels better, has passed some air is having less pain and nausea area have discussed with Dr. Marcello Moores.  Objective: Vital signs in last 24 hours: Temp:  [98.3 F (36.8 C)-99.3 F (37.4 C)] 98.4 F (36.9 C) (07/10 0653) Pulse Rate:  [61-78] 72 (07/10 0653) Resp:  [16-17] 16 (07/10 0653) BP: (108-119)/(70-80) 108/70 (07/10 0653) SpO2:  [98 %-100 %] 98 % (07/10 0653) Last BM Date: 04/09/17  Intake/Output from previous day: 07/09 0701 - 07/10 0700 In: 2223.3 [P.O.:480; I.V.:1543.3; IV Piggyback:200] Out: 950 [Urine:950] Intake/Output this shift: No intake/output data recorded.  PE: General-- alert oriented no distress  Abdomen-- mild lower abdominal tenderness bowel sounds present  Lab Results:  Recent Labs  04/06/17 1757 04/07/17 0100 04/08/17 0503  WBC 11.9* 11.0* 12.3*  HGB 13.6 13.2 14.9  HCT 40.7 39.9 43.9  PLT 362 367 331   BMET  Recent Labs  04/06/17 1756 04/07/17 0100 04/08/17 0503  NA 139 140 135  K 3.7 3.7 4.2  CL 105 104 105  CO2 25 26 23   CREATININE 0.87 0.93 0.87   LFT  Recent Labs  04/06/17 1756  PROT 6.7  AST 24  ALT 24  ALKPHOS 118  BILITOT 0.5   PT/INR No results for input(s): LABPROT, INR in the last 72 hours. PANCREAS  Recent Labs  04/06/17 1756  LIPASE 20         Studies/Results: No results found.  Medications: I have reviewed the patient's current medications.  Assessment/Plan: 1. Crohn's disease with ileitis fistula and abscess. This is occurred despite regular Humira therapy. Agree with Dr. Marcello Moores that drainage of abscess in surgical resection of the small amount of terminal Ilium's claim would be reasonable. At this point I would continue Humira postoperatively and when he follows up with Dr Therisa Doyne in the office postoperatively in the inflammatory response is resolved, we will obtain Humira levels and antibody levels to determine if he should be continued on  Humira or change to another biologic.   Miosotis Wetsel JR,Lisett Dirusso L 04/09/2017, 9:16 AM  This note was created using voice recognition software. Minor errors may Have occurred unintentionally.  Pager: [phone] If no answer or after hours call [phone]

## 2017-04-09 NOTE — Progress Notes (Signed)
PROGRESS NOTE    Nathan Shaw  HYQ:[id number] DOB: 01/03/1956 DOA: 04/06/2017 PCP: Hulan Fess, MD    Brief Narrative:  61 year old male who presented with abdominal cramping. Patient is known to have Crohn's disease, GERD, hypogonadism. About 4 weeks ago he had a episode of Crohn's flare, treated with 6-MP, with improvement of his symptoms. The day of admission he had acute recurrence of abdominal pain, colicky in nature, severe in intensity and worsening, localized in the right lower quadrant, then he presented to the hospital due to worsening symptoms. He was recently treated for an episode of prostatitis with ciprofloxacin. On initial physical examination blood pressure 136/89, heart rate 85, respiratory rate 16, temperature 98.2, oxygen saturation 100%, mucous membranes were moist, lungs were clear to auscultation bilaterally, heart S1-S2 present rhythmic, the abdomen was distended, had hypoactive bowel sounds, tender to palpation in the upper quadrants and more pronounced on the right lower quadrant, positive guarding at that site. Sodium 139, potassium 3.7, chloride 105, bicarbonate 25, glucose 121, BUN 8, creatinine 0.87, AST 24, ALT 24, white count 11.9, hemoglobin 13.6, hematocrit 40.7, platelets 360, urinalysis negative for infection. 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   Patient was admitted to medical floor with working diagnosis of acute Crohn's flare complicated by mesenteric abscess.   Assessment & Plan:   Active Problems:   GERD (gastroesophageal reflux disease)   Crohn's ileitis, with abscess ([place])   1. Crohn's flare with mesenteric abscess, with viridians strp. bacteremia Patient clinically stable, abdominal pain has been stable, no nausea or vomiting, tolerating po well. Will follow on cell count in am. Will follow on surgical recommendations, for possible  surgical intervention this week. Continue antibiotic therapy with Zosyn and will follow on repeat blood cultures. Continue imunosupression with Imuran. IV fluids.   2. History of alcohol abuse. No clinical signs of withdrawal. Continue neuro checks per unit protocol.  3. Hypogonadism. On topical testosterone, with good toleration.   4. History of herpes. On valacyclovir, no activation. Per home regimen.    DVT prophylaxis:enoxaparin  Code Status:full  Family Communication: Disposition Plan:home    Consultants:  Surgery  Gastroenterology   Procedures:   Antimicrobials:   Zosyn.   Subjective: Patient with controlled abdominal pain, no nausea or vomiting, positive watery stools. No chest pain or dyspnea.   Objective: Vitals:   04/08/17 0513 04/08/17 1425 04/08/17 2213 04/09/17 0653  BP: 126/74 113/80 119/77 108/70  Pulse: 91 61 78 72  Resp: 18 17 16 16   Temp: 98.7 F (37.1 C) 98.3 F (36.8 C) 99.3 F (37.4 C) 98.4 F (36.9 C)  TempSrc: Oral Oral Oral Oral  SpO2: 96% 100% 98% 98%  Weight:      Height:        Intake/Output Summary (Last 24 hours) at 04/09/17 1143 Last data filed at 04/09/17 1000  Gross per 24 hour  Intake          2453.33 ml  Output              950 ml  Net          1503.33 ml   Filed Weights   04/07/17 0115  Weight: 83.6 kg (184 lb 4.9 oz)    Examination:  General exam: not in pain or dyspnea E ENT, mild pallor, no icterus, oral mucosa moist.  Respiratory system: Clear to auscultation. Respiratory effort normal. Cardiovascular  system: S1 & S2 heard, RRR. No JVD, murmurs, rubs, gallops or clicks. No pedal edema. Gastrointestinal system: Abdomen is distended, soft and nontender. No organomegaly or masses felt. Normal bowel sounds heard. Central nervous system: Alert and oriented. No focal neurological deficits. Extremities: Symmetric 5 x 5 power. Skin: No rashes, lesions or ulcers     Data Reviewed: I have  personally reviewed following labs and imaging studies  CBC:  Recent Labs Lab 04/06/17 1757 04/07/17 0100 04/08/17 0503  WBC 11.9* 11.0* 12.3*  NEUTROABS 8.9*  --  9.0*  HGB 13.6 13.2 14.9  HCT 40.7 39.9 43.9  MCV 83.6 83.6 83.0  PLT 362 367 466   Basic Metabolic Panel:  Recent Labs Lab 04/06/17 1756 04/07/17 0100 04/08/17 0503  NA 139 140 135  K 3.7 3.7 4.2  CL 105 104 105  CO2 25 26 23   GLUCOSE 121* 91 143*  BUN 8 6 6   CREATININE 0.87 0.93 0.87  CALCIUM 8.9 8.8* 8.4*   GFR: Estimated Creatinine Clearance: 98.6 mL/min (by C-G formula based on SCr of 0.87 mg/dL). Liver Function Tests:  Recent Labs Lab 04/06/17 1756  AST 24  ALT 24  ALKPHOS 118  BILITOT 0.5  PROT 6.7  ALBUMIN 2.8*    Recent Labs Lab 04/06/17 1756  LIPASE 20   No results for input(s): AMMONIA in the last 168 hours. Coagulation Profile: No results for input(s): INR, PROTIME in the last 168 hours. Cardiac Enzymes: No results for input(s): CKTOTAL, CKMB, CKMBINDEX, TROPONINI in the last 168 hours. BNP (last 3 results) No results for input(s): PROBNP in the last 8760 hours. HbA1C: No results for input(s): HGBA1C in the last 72 hours. CBG:  Recent Labs Lab 04/07/17 0709 04/08/17 0721 04/09/17 0728  GLUCAP 91 133* 104*   Lipid Profile: No results for input(s): CHOL, HDL, LDLCALC, TRIG, CHOLHDL, LDLDIRECT in the last 72 hours. Thyroid Function Tests: No results for input(s): TSH, T4TOTAL, FREET4, T3FREE, THYROIDAB in the last 72 hours. Anemia Panel: No results for input(s): VITAMINB12, FOLATE, FERRITIN, TIBC, IRON, RETICCTPCT in the last 72 hours. Sepsis Labs: No results for input(s): PROCALCITON, LATICACIDVEN in the last 168 hours.  Recent Results (from the past 240 hour(s))  Culture, blood (routine x 2)     Status: None (Preliminary result)   Collection Time: 04/07/17  1:00 AM  Result Value Ref Range Status   Specimen Description BLOOD BLOOD LEFT HAND  Final   Special  Requests   Final    BOTTLES DRAWN AEROBIC ONLY Blood Culture adequate volume   Culture  Setup Time   Final    GRAM POSITIVE COCCI IN CHAINS AEROBIC BOTTLE ONLY CRITICAL RESULT CALLED TO, READ BACK BY AND VERIFIED WITH: M LILLISTON,PHARMD AT 1023 04/08/17 BY L BENFIELD Performed at [facility], [place] [address], [place],  [address]    Culture GRAM POSITIVE COCCI  Final   Report Status PENDING  Incomplete  Culture, blood (routine x 2)     Status: None (Preliminary result)   Collection Time: 04/07/17  1:00 AM  Result Value Ref Range Status   Specimen Description BLOOD RIGHT ANTECUBITAL  Final   Special Requests   Final    BOTTLES DRAWN AEROBIC ONLY Blood Culture adequate volume   Culture  Setup Time   Final    GRAM POSITIVE COCCI IN CHAINS AEROBIC BOTTLE ONLY CRITICAL RESULT CALLED TO, READ BACK BY AND VERIFIED WITH: M LILLISTON,PHARMD AT 1023 04/08/17 BY L BENFIELD Performed at [facility], [place] [address], [place], [place] [address]    Culture GRAM POSITIVE COCCI  Final   Report Status PENDING  Incomplete  Blood Culture ID Panel (Reflexed)     Status: Abnormal   Collection Time: 04/07/17  1:00 AM  Result Value Ref Range Status   Enterococcus species NOT DETECTED NOT DETECTED Final   Listeria monocytogenes NOT DETECTED NOT DETECTED Final   Staphylococcus species NOT DETECTED NOT DETECTED Final   Staphylococcus aureus NOT DETECTED NOT DETECTED Final   Streptococcus species DETECTED (A) NOT DETECTED Final    Comment: Not Enterococcus species, Streptococcus agalactiae, Streptococcus pyogenes, or Streptococcus pneumoniae. CRITICAL RESULT CALLED TO, READ BACK BY AND VERIFIED WITH: M LILLISTON,PHARMD AT 1023 04/08/17 BY L BENFIELD    Streptococcus agalactiae NOT DETECTED NOT DETECTED Final   Streptococcus pneumoniae NOT DETECTED NOT DETECTED Final   Streptococcus pyogenes NOT DETECTED NOT DETECTED Final   Acinetobacter baumannii NOT DETECTED NOT DETECTED Final    Enterobacteriaceae species NOT DETECTED NOT DETECTED Final   Enterobacter cloacae complex NOT DETECTED NOT DETECTED Final   Escherichia coli NOT DETECTED NOT DETECTED Final   Klebsiella oxytoca NOT DETECTED NOT DETECTED Final   Klebsiella pneumoniae NOT DETECTED NOT DETECTED Final   Proteus species NOT DETECTED NOT DETECTED Final   Serratia marcescens NOT DETECTED NOT DETECTED Final   Haemophilus influenzae NOT DETECTED NOT DETECTED Final   Neisseria meningitidis NOT DETECTED NOT DETECTED Final   Pseudomonas aeruginosa NOT DETECTED NOT DETECTED Final   Candida albicans NOT DETECTED NOT DETECTED Final   Candida glabrata NOT DETECTED NOT DETECTED Final   Candida krusei NOT DETECTED NOT DETECTED Final   Candida parapsilosis NOT DETECTED NOT DETECTED Final   Candida tropicalis NOT DETECTED NOT DETECTED Final    Comment: Performed at [facility], [place]. [address] [place], [place] [address]         Radiology Studies: No results found.      Scheduled Meds: . azaTHIOprine  100 mg Oral Daily  . cholecalciferol  2,000 Units Oral Daily  . enoxaparin (LOVENOX) injection  40 mg Subcutaneous QHS  . loratadine  10 mg Oral Daily  . mouth rinse  15 mL Mouth Rinse q12n4p  . omega-3 acid ethyl esters  1,000 mg Oral Daily  . testosterone  5 g Transdermal Daily  . valACYclovir  500 mg Oral Daily   Continuous Infusions: . dextrose 5 % and 0.9% NaCl 75 mL/hr at 04/09/17 0809  . famotidine (PEPCID) IV Stopped (04/09/17 0839)  . piperacillin-tazobactam (ZOSYN)  IV 3.375 g (04/09/17 0650)     LOS: 3 days       Mauricio Gerome Apley, MD [facility] Pager [phone]  If 7PM-7AM, please contact night-coverage [email or web address] Password [email or web address] 04/09/2017, 11:43 AM

## 2017-04-09 NOTE — Progress Notes (Signed)
Pharmacy Antibiotic Note  Nathan Shaw is a 61 y.o. male with Crohn's disease admitted on 04/06/2017 with ileitis with mesenteric abscess.  Also with Streptococcus bacteremia. Pharmacy has been consulted for Zosyn dosing.  Today, 04/09/2017  Day #4 Zosyn Tmax 99.2 WBC 12.3, unchanged SCr 0.87, stable  Plan:  Continue Zosyn 3.375gm IV q8h (4hr extended infusions)  Monitor renal function  Consider repeating blood cultures to document clearance of bacteremia  Height: 5\' 11"  (180.3 cm) Weight: 184 lb 4.9 oz (83.6 kg) IBW/kg (Calculated) : 75.3  Temp (24hrs), Avg:98.8 F (37.1 C), Min:98.3 F (36.8 C), Max:99.3 F (37.4 C)   Recent Labs Lab 04/06/17 1756 04/06/17 1757 04/07/17 0100 04/08/17 0503  WBC  --  11.9* 11.0* 12.3*  CREATININE 0.87  --  0.93 0.87    Estimated Creatinine Clearance: 98.6 mL/min (by C-G formula based on SCr of 0.87 mg/dL).    No Known Allergies  Antimicrobials this admission:  7/7 Zosyn  >>  PTA Valtrex >>  Dose adjustments this admission:  ---  Microbiology results:  7/8 BCx x2: 2/2 GPCs in chains (2/2 bottles); BCID shows strep species   Thank you for allowing pharmacy to be a part of this patient's care.  Peggyann Juba, PharmD, BCPS Pager: [phone] 04/09/2017 1:54 PM

## 2017-04-10 ENCOUNTER — Inpatient Hospital Stay (HOSPITAL_COMMUNITY): Payer: BLUE CROSS/BLUE SHIELD

## 2017-04-10 LAB — CBC WITH DIFFERENTIAL/PLATELET
Basophils Absolute: 0 10*3/uL (ref 0.0–0.1)
Basophils Relative: 1 %
Eosinophils Absolute: 0.4 10*3/uL (ref 0.0–0.7)
Eosinophils Relative: 6 %
HCT: 39 % (ref 39.0–52.0)
Hemoglobin: 12.7 g/dL — ABNORMAL LOW (ref 13.0–17.0)
Lymphocytes Relative: 25 %
Lymphs Abs: 1.5 10*3/uL (ref 0.7–4.0)
MCH: 27.3 pg (ref 26.0–34.0)
MCHC: 32.6 g/dL (ref 30.0–36.0)
MCV: 83.7 fL (ref 78.0–100.0)
Monocytes Absolute: 0.6 10*3/uL (ref 0.1–1.0)
Monocytes Relative: 10 %
Neutro Abs: 3.5 10*3/uL (ref 1.7–7.7)
Neutrophils Relative %: 58 %
Platelets: 245 10*3/uL (ref 150–400)
RBC: 4.66 MIL/uL (ref 4.22–5.81)
RDW: 13.4 % (ref 11.5–15.5)
WBC: 6 10*3/uL (ref 4.0–10.5)

## 2017-04-10 LAB — CULTURE, BLOOD (ROUTINE X 2)
Special Requests: ADEQUATE
Special Requests: ADEQUATE

## 2017-04-10 LAB — BASIC METABOLIC PANEL
Anion gap: 10 (ref 5–15)
BUN: <5 mg/dL — ABNORMAL LOW (ref 6–20)
CO2: 25 mmol/L (ref 22–32)
Calcium: 8.3 mg/dL — ABNORMAL LOW (ref 8.9–10.3)
Chloride: 107 mmol/L (ref 101–111)
Creatinine, Ser: 0.89 mg/dL (ref 0.61–1.24)
GFR calc Af Amer: >60 mL/min (ref >60)
GFR calc non Af Amer: >60 mL/min (ref >60)
Glucose, Bld: 97 mg/dL (ref 65–99)
Potassium: 3.6 mmol/L (ref 3.5–5.1)
Sodium: 142 mmol/L (ref 135–145)

## 2017-04-10 LAB — GLUCOSE, CAPILLARY: Glucose-Capillary: 96 mg/dL (ref 65–99)

## 2017-04-10 MED ORDER — IOPAMIDOL (ISOVUE-300) INJECTION 61%
100.0000 mL | Freq: Once | INTRAVENOUS | Status: AC | PRN
  Administered 2017-04-10: 100 mL via INTRAVENOUS

## 2017-04-10 MED ORDER — IOPAMIDOL (ISOVUE-300) INJECTION 61%
INTRAVENOUS | Status: AC
  Filled 2017-04-10: qty 100

## 2017-04-10 MED ORDER — IOPAMIDOL (ISOVUE-300) INJECTION 61%
INTRAVENOUS | Status: AC
  Filled 2017-04-10: qty 30

## 2017-04-10 MED ORDER — IOPAMIDOL (ISOVUE-300) INJECTION 61%
30.0000 mL | Freq: Once | INTRAVENOUS | Status: DC | PRN
  Administered 2017-04-10: 30 mL via ORAL
  Filled 2017-04-10: qty 30

## 2017-04-10 NOTE — Progress Notes (Signed)
[facility] PROGRESS NOTE Subjective patient feels well. He has a streptococcus bacteremia and is on antibiotics. Surgery is planned for later this week.  Objective: Vital signs in last 24 hours: Temp:  [98.4 F (36.9 C)-99.2 F (37.3 C)] 98.4 F (36.9 C) (07/11 0607) Pulse Rate:  [59-72] 59 (07/11 0607) Resp:  [15-16] 16 (07/11 0607) BP: (113-124)/(70-76) 113/76 (07/11 0607) SpO2:  [99 %-100 %] 100 % (07/11 0607) Last BM Date: 04/09/17  Intake/Output from previous day: 07/10 0701 - 07/11 0700 In: 3226.3 [P.O.:1680; I.V.:1346.3; IV Piggyback:200] Out: -  Intake/Output this shift: No intake/output data recorded.  PE: General-- no distress alert and oriented  Abdomen-- soft and much less tender  Lab Results:  Recent Labs  04/08/17 0503 04/10/17 0533  WBC 12.3* 6.0  HGB 14.9 12.7*  HCT 43.9 39.0  PLT 331 245   BMET  Recent Labs  04/08/17 0503 04/10/17 0533  NA 135 142  K 4.2 3.6  CL 105 107  CO2 23 25  CREATININE 0.87 0.89   LFT No results for input(s): PROT, AST, ALT, ALKPHOS, BILITOT, BILIDIR, IBILI in the last 72 hours. PT/INR No results for input(s): LABPROT, INR in the last 72 hours. PANCREAS No results for input(s): LIPASE in the last 72 hours.       Studies/Results: No results found.  Medications: I have reviewed the patient's current medications.  Assessment/Plan: 1. Crohn's ileitis with fistula and abscess. Agree with Dr. Marcello Moores that resection and drainage of abscess is appropriate. The patient is due for Humira injection Sunday. I would keep him on Humira postoperatively as well as 6 MP and have him follow up with Dr Encarnacion Slates outpatient. Will likely check Humira levels and presence of antibodies prior to injection. This will determine whether continuation of Humira appropriate or change to another biologic. We will check on the results of the operation and see the patient every few days.Oletta Lamas JR,Dayn Barich L 04/10/2017, 9:34  AM  This note was created using voice recognition software. Minor errors may Have occurred unintentionally.  Pager: [phone] If no answer or after hours call [phone]

## 2017-04-10 NOTE — Progress Notes (Signed)
PROGRESS NOTE    Nathan Shaw  KDT:[id number] DOB: 03-Jun-1956 DOA: 04/06/2017 PCP: Hulan Fess, MD     Brief Narrative:  61 year old male who presented with abdominal cramping. Patient is known to have Crohn's disease, GERD, hypogonadism. About 4 weeks ago he had a episode of Crohn's flare, treated with 6-MP, with improvement of his symptoms. The day of admission he had acute recurrence of abdominal pain, colicky in nature, severe in intensity and worsening, localized in the right lower quadrant, then he presented to the hospital due to worsening symptoms. He was recently treated for an episode of prostatitis with ciprofloxacin. On initial physical examination blood pressure 136/89, heart rate 85, respiratory rate 16, temperature 98.2, oxygen saturation 100%, mucous membranes were moist, lungs were clear to auscultation bilaterally, heart S1-S2 present rhythmic, the abdomen was distended, had hypoactive bowel sounds, tender to palpation in the upper quadrants and more pronounced on the right lower quadrant, positive guarding at that site. Sodium 139, potassium 3.7, chloride 105, bicarbonate 25, glucose 121, BUN 8, creatinine 0.87, AST 24, ALT 24, white count 11.9, hemoglobin 13.6, hematocrit 40.7, platelets 360, urinalysis negative for infection. 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   Patient was admitted to medical floor with working diagnosis of acute Crohn's flare complicated by mesenteric abscess.   Assessment & Plan:   Active Problems:   GERD (gastroesophageal reflux disease)   Crohn's ileitis, with abscess ([place])  1. Crohn's flare with mesenteric abscess, with viridians strp. bacteremia Patient clinically stable, blood culture positive for streptococcus sanguis, will continue antibiotic therapy with Zosyn, to keep anaerobic coverage, will follow on repeat cultures, will  continue IV fluids. Plan for surgery in am. Continue on imuran per GI recommendations.   2. History of alcohol abuse. Continue neuro checks per unit protocol, no anxiety.   3. Hypogonadism. Continue on topical testosterone, with good toleration.   4. History of herpes. Continue with valacyclovir,   DVT prophylaxis:enoxaparin  Code Status:full  Family Communication: Disposition Plan:home    Consultants:  Surgery  Gastroenterology   Procedures:   Antimicrobials:   Zosyn.   Subjective: Patient is feeling better, no nausea or vomiting, mild abdominal pain, no fever or chills, positive watery stools,.   Objective: Vitals:   04/09/17 0653 04/09/17 1351 04/09/17 2236 04/10/17 0607  BP: 108/70 117/76 124/70 113/76  Pulse: 72 72 62 (!) 59  Resp: 16 16 15 16   Temp: 98.4 F (36.9 C) 99.2 F (37.3 C) 98.6 F (37 C) 98.4 F (36.9 C)  TempSrc: Oral Oral Oral Oral  SpO2: 98% 99% 99% 100%  Weight:      Height:        Intake/Output Summary (Last 24 hours) at 04/10/17 1307 Last data filed at 04/10/17 1057  Gross per 24 hour  Intake          2756.25 ml  Output                0 ml  Net          2756.25 ml   Filed Weights   04/07/17 0115  Weight: 83.6 kg (184 lb 4.9 oz)    Examination:  General exam: not in pain or dyspnea E ENT: no pallor or icterus, oral mucosa moist.  Respiratory system: Clear to auscultation. Respiratory effort normal. No wheezing, rales or rhonchi.  Cardiovascular system: S1 & S2 heard, RRR. No JVD,  murmurs, rubs, gallops or clicks. No pedal edema. Gastrointestinal system: Abdomen is mild distended, soft and nontender. No organomegaly or masses felt. Decreased  bowel sounds heard. Central nervous system: Alert and oriented. No focal neurological deficits. Extremities: Symmetric 5 x 5 power. Skin: No rashes, lesions or ulcers     Data Reviewed: I have personally reviewed following labs and imaging studies  CBC:  Recent  Labs Lab 04/06/17 1757 04/07/17 0100 04/08/17 0503 04/10/17 0533  WBC 11.9* 11.0* 12.3* 6.0  NEUTROABS 8.9*  --  9.0* 3.5  HGB 13.6 13.2 14.9 12.7*  HCT 40.7 39.9 43.9 39.0  MCV 83.6 83.6 83.0 83.7  PLT 362 367 331 109   Basic Metabolic Panel:  Recent Labs Lab 04/06/17 1756 04/07/17 0100 04/08/17 0503 04/10/17 0533  NA 139 140 135 142  K 3.7 3.7 4.2 3.6  CL 105 104 105 107  CO2 25 26 23 25   GLUCOSE 121* 91 143* 97  BUN 8 6 6  <5*  CREATININE 0.87 0.93 0.87 0.89  CALCIUM 8.9 8.8* 8.4* 8.3*   GFR: Estimated Creatinine Clearance: 96.4 mL/min (by C-G formula based on SCr of 0.89 mg/dL). Liver Function Tests:  Recent Labs Lab 04/06/17 1756  AST 24  ALT 24  ALKPHOS 118  BILITOT 0.5  PROT 6.7  ALBUMIN 2.8*    Recent Labs Lab 04/06/17 1756  LIPASE 20   No results for input(s): AMMONIA in the last 168 hours. Coagulation Profile: No results for input(s): INR, PROTIME in the last 168 hours. Cardiac Enzymes: No results for input(s): CKTOTAL, CKMB, CKMBINDEX, TROPONINI in the last 168 hours. BNP (last 3 results) No results for input(s): PROBNP in the last 8760 hours. HbA1C: No results for input(s): HGBA1C in the last 72 hours. CBG:  Recent Labs Lab 04/07/17 0709 04/08/17 0721 04/09/17 0728 04/10/17 0709  GLUCAP 91 133* 104* 96   Lipid Profile: No results for input(s): CHOL, HDL, LDLCALC, TRIG, CHOLHDL, LDLDIRECT in the last 72 hours. Thyroid Function Tests: No results for input(s): TSH, T4TOTAL, FREET4, T3FREE, THYROIDAB in the last 72 hours. Anemia Panel: No results for input(s): VITAMINB12, FOLATE, FERRITIN, TIBC, IRON, RETICCTPCT in the last 72 hours. Sepsis Labs: No results for input(s): PROCALCITON, LATICACIDVEN in the last 168 hours.  Recent Results (from the past 240 hour(s))  Culture, blood (routine x 2)     Status: Abnormal   Collection Time: 04/07/17  1:00 AM  Result Value Ref Range Status   Specimen Description BLOOD BLOOD LEFT HAND   Final   Special Requests   Final    BOTTLES DRAWN AEROBIC ONLY Blood Culture adequate volume   Culture  Setup Time   Final    GRAM POSITIVE COCCI IN CHAINS AEROBIC BOTTLE ONLY CRITICAL RESULT CALLED TO, READ BACK BY AND VERIFIED WITH: M LILLISTON,PHARMD AT 1023 04/08/17 BY L BENFIELD    Culture (A)  Final    STREPTOCOCCUS PARASANGUINIS THE SIGNIFICANCE OF ISOLATING THIS ORGANISM FROM A SINGLE SET OF BLOOD CULTURES WHEN MULTIPLE SETS ARE DRAWN IS UNCERTAIN. PLEASE NOTIFY THE MICROBIOLOGY DEPARTMENT WITHIN ONE WEEK IF SPECIATION AND SENSITIVITIES ARE REQUIRED. Performed at [facility], [place] [address], [place], [place] [address]    Report Status 04/10/2017 FINAL  Final  Culture, blood (routine x 2)     Status: Abnormal   Collection Time: 04/07/17  1:00 AM  Result Value Ref Range Status   Specimen Description BLOOD RIGHT ANTECUBITAL  Final   Special Requests   Final  BOTTLES DRAWN AEROBIC ONLY Blood Culture adequate volume   Culture  Setup Time   Final    GRAM POSITIVE COCCI IN CHAINS AEROBIC BOTTLE ONLY CRITICAL RESULT CALLED TO, READ BACK BY AND VERIFIED WITH: M LILLISTON,PHARMD AT 1023 04/08/17 BY L BENFIELD    Culture (A)  Final    STREPTOCOCCUS CRISTATUS THE SIGNIFICANCE OF ISOLATING THIS ORGANISM FROM A SINGLE SET OF BLOOD CULTURES WHEN MULTIPLE SETS ARE DRAWN IS UNCERTAIN. PLEASE NOTIFY THE MICROBIOLOGY DEPARTMENT WITHIN ONE WEEK IF SPECIATION AND SENSITIVITIES ARE REQUIRED. Performed at [facility], [place] [address], [place], [place] [address]    Report Status 04/10/2017 FINAL  Final  Blood Culture ID Panel (Reflexed)     Status: Abnormal   Collection Time: 04/07/17  1:00 AM  Result Value Ref Range Status   Enterococcus species NOT DETECTED NOT DETECTED Final   Listeria monocytogenes NOT DETECTED NOT DETECTED Final   Staphylococcus species NOT DETECTED NOT DETECTED Final   Staphylococcus aureus NOT DETECTED NOT DETECTED Final   Streptococcus species DETECTED (A)  NOT DETECTED Final    Comment: Not Enterococcus species, Streptococcus agalactiae, Streptococcus pyogenes, or Streptococcus pneumoniae. CRITICAL RESULT CALLED TO, READ BACK BY AND VERIFIED WITH: M LILLISTON,PHARMD AT 1023 04/08/17 BY L BENFIELD    Streptococcus agalactiae NOT DETECTED NOT DETECTED Final   Streptococcus pneumoniae NOT DETECTED NOT DETECTED Final   Streptococcus pyogenes NOT DETECTED NOT DETECTED Final   Acinetobacter baumannii NOT DETECTED NOT DETECTED Final   Enterobacteriaceae species NOT DETECTED NOT DETECTED Final   Enterobacter cloacae complex NOT DETECTED NOT DETECTED Final   Escherichia coli NOT DETECTED NOT DETECTED Final   Klebsiella oxytoca NOT DETECTED NOT DETECTED Final   Klebsiella pneumoniae NOT DETECTED NOT DETECTED Final   Proteus species NOT DETECTED NOT DETECTED Final   Serratia marcescens NOT DETECTED NOT DETECTED Final   Haemophilus influenzae NOT DETECTED NOT DETECTED Final   Neisseria meningitidis NOT DETECTED NOT DETECTED Final   Pseudomonas aeruginosa NOT DETECTED NOT DETECTED Final   Candida albicans NOT DETECTED NOT DETECTED Final   Candida glabrata NOT DETECTED NOT DETECTED Final   Candida krusei NOT DETECTED NOT DETECTED Final   Candida parapsilosis NOT DETECTED NOT DETECTED Final   Candida tropicalis NOT DETECTED NOT DETECTED Final    Comment: Performed at [facility], [place] [address], [place], [place] [address]  Culture, blood (Routine X 2) w Reflex to ID Panel     Status: None (Preliminary result)   Collection Time: 04/09/17  3:22 PM  Result Value Ref Range Status   Specimen Description BLOOD RIGHT HAND  Final   Special Requests   Final    BOTTLES DRAWN AEROBIC AND ANAEROBIC Blood Culture adequate volume   Culture   Final    NO GROWTH < 24 HOURS Performed at [facility], [place] [address] [place], [place] [address]    Report Status PENDING  Incomplete  Culture, blood (Routine X 2) w Reflex to ID Panel     Status: None  (Preliminary result)   Collection Time: 04/09/17  5:33 PM  Result Value Ref Range Status   Specimen Description BLOOD LEFT HAND  Final   Special Requests   Final    BOTTLES DRAWN AEROBIC ONLY Blood Culture adequate volume   Culture   Final    NO GROWTH < 24 HOURS Performed at [facility], [place] [address] [place], [place] [address]    Report Status PENDING  Incomplete  Radiology Studies: No results found.      Scheduled Meds: . azaTHIOprine  100 mg Oral Daily  . cholecalciferol  2,000 Units Oral Daily  . enoxaparin (LOVENOX) injection  40 mg Subcutaneous QHS  . loratadine  10 mg Oral Daily  . mouth rinse  15 mL Mouth Rinse q12n4p  . omega-3 acid ethyl esters  1,000 mg Oral Daily  . testosterone  5 g Transdermal Daily  . valACYclovir  500 mg Oral Daily   Continuous Infusions: . dextrose 5 % and 0.9% NaCl 50 mL/hr at 04/09/17 2250  . famotidine (PEPCID) IV Stopped (04/10/17 1027)  . piperacillin-tazobactam (ZOSYN)  IV Stopped (04/10/17 0940)     LOS: 4 days        Srikar Chiang Gerome Apley, MD [facility] Pager [phone]  If 7PM-7AM, please contact night-coverage [email or web address] Password [email or web address] 04/10/2017, 1:07 PM

## 2017-04-10 NOTE — Progress Notes (Signed)
[facility] Progress Note     Subjective: CC:  abdominal pain continues to improve - reports no pain at rest and mild-moderating pain with palpation. Tolerating clears, drinking roughly 2/3 of his tray. Having loose BMs. Denies nausea or vomiting.  Afebrile, VSS Objective: Vital signs in last 24 hours: Temp:  [98.4 F (36.9 C)-99.2 F (37.3 C)] 98.4 F (36.9 C) (07/11 0607) Pulse Rate:  [59-72] 59 (07/11 0607) Resp:  [15-16] 16 (07/11 0607) BP: (113-124)/(70-76) 113/76 (07/11 0607) SpO2:  [99 %-100 %] 100 % (07/11 0607) Last BM Date: 04/09/17  Intake/Output from previous day: 07/10 0701 - 07/11 0700 In: 3226.3 [P.O.:1680; I.V.:1346.3; IV Piggyback:200] Out: -  Intake/Output this shift: No intake/output data recorded.  PE: Gen:  Alert, NAD, pleasant Card:  Regular rate and rhythm, pedal pulses 2+ BL Pulm:  Normal effort, clear to auscultation bilaterally Abd: Soft, TTP lower abdomen bilaterally R>L, bowel sounds present in all 4 quadrants Skin: warm and dry, no rashes  Psych: A&Ox3   Lab Results:   Recent Labs  04/08/17 0503 04/10/17 0533  WBC 12.3* 6.0  HGB 14.9 12.7*  HCT 43.9 39.0  PLT 331 245   BMET  Recent Labs  04/08/17 0503 04/10/17 0533  NA 135 142  K 4.2 3.6  CL 105 107  CO2 23 25  GLUCOSE 143* 97  BUN 6 <5*  CREATININE 0.87 0.89  CALCIUM 8.4* 8.3*   PT/INR No results for input(s): LABPROT, INR in the last 72 hours. CMP     Component Value Date/Time   NA 142 04/10/2017 0533   K 3.6 04/10/2017 0533   CL 107 04/10/2017 0533   CO2 25 04/10/2017 0533   GLUCOSE 97 04/10/2017 0533   BUN <5 (L) 04/10/2017 0533   CREATININE 0.89 04/10/2017 0533   CALCIUM 8.3 (L) 04/10/2017 0533   PROT 6.7 04/06/2017 1756   ALBUMIN 2.8 (L) 04/06/2017 1756   AST 24 04/06/2017 1756   ALT 24 04/06/2017 1756   ALKPHOS 118 04/06/2017 1756   BILITOT 0.5 04/06/2017 1756   GFRNONAA >60 04/10/2017 0533   GFRAA >60 04/10/2017 0533   Lipase      Component Value Date/Time   LIPASE 20 04/06/2017 1756   Anti-infectives: Anti-infectives    Start     Dose/Rate Route Frequency Ordered Stop   04/07/17 1000  valACYclovir (VALTREX) tablet 500 mg     500 mg Oral Daily 04/06/17 2333     04/07/17 0600  piperacillin-tazobactam (ZOSYN) IVPB 3.375 g     3.375 g 12.5 mL/hr over 240 Minutes Intravenous Every 8 hours 04/06/17 2353     04/06/17 2115  piperacillin-tazobactam (ZOSYN) IVPB 3.375 g     3.375 g 100 mL/hr over 30 Minutes Intravenous  Once 04/06/17 2110 04/06/17 2220     Assessment/Plan Crohn's ileitis with fistula formation, mesenteric abscess             - on IMURAN and Humira              - continue IV abx for intraabdominal infection and bacteremia.             - plain for OR tomorrow vs Friday for surgical resection.  - GI recommends continuing Humira postoperatively (next injection scheduled 7/15) as well as 6 MP; follow up  with Dr Encarnacion Slates outpatient             - pain control   FEN: Clear liquids; BMET WNL this AM ID:  Zosyn 7/8 >>; leukocytosis resolved; streptococcus bacteremia   VTE: Lovenox, SCD's    LOS: 4 days    Jill Alexanders , [facility] 04/10/2017, 9:48 AM Pager: [phone] Consults: [phone] Mon-Fri 7:00 am-4:30 pm Sat-Sun 7:00 am-11:30 am

## 2017-04-11 ENCOUNTER — Encounter (HOSPITAL_COMMUNITY): Payer: Self-pay | Admitting: Anesthesiology

## 2017-04-11 ENCOUNTER — Inpatient Hospital Stay (HOSPITAL_COMMUNITY): Payer: BLUE CROSS/BLUE SHIELD | Admitting: Anesthesiology

## 2017-04-11 ENCOUNTER — Encounter (HOSPITAL_COMMUNITY)
Admission: EM | Disposition: A | Discharge status: Home / Self Care | Payer: Self-pay | Source: Home / Self Care | Attending: Internal Medicine

## 2017-04-11 DIAGNOSIS — K529 Noninfective gastroenteritis and colitis, unspecified: Secondary | ICD-10-CM

## 2017-04-11 HISTORY — PX: LAPAROSCOPIC ILEOCECECTOMY: SHX5898

## 2017-04-11 LAB — CBC WITH DIFFERENTIAL/PLATELET
Basophils Absolute: 0 10*3/uL (ref 0.0–0.1)
Basophils Relative: 0 %
Eosinophils Absolute: 0.3 10*3/uL (ref 0.0–0.7)
Eosinophils Relative: 5 %
HCT: 38.1 % — ABNORMAL LOW (ref 39.0–52.0)
Hemoglobin: 12.5 g/dL — ABNORMAL LOW (ref 13.0–17.0)
Lymphocytes Relative: 24 %
Lymphs Abs: 1.7 10*3/uL (ref 0.7–4.0)
MCH: 27.5 pg (ref 26.0–34.0)
MCHC: 32.8 g/dL (ref 30.0–36.0)
MCV: 83.7 fL (ref 78.0–100.0)
Monocytes Absolute: 0.5 10*3/uL (ref 0.1–1.0)
Monocytes Relative: 7 %
Neutro Abs: 4.5 10*3/uL (ref 1.7–7.7)
Neutrophils Relative %: 64 %
Platelets: 268 10*3/uL (ref 150–400)
RBC: 4.55 MIL/uL (ref 4.22–5.81)
RDW: 13.4 % (ref 11.5–15.5)
WBC: 7.1 10*3/uL (ref 4.0–10.5)

## 2017-04-11 LAB — BASIC METABOLIC PANEL
Anion gap: 7 (ref 5–15)
BUN: <5 mg/dL — ABNORMAL LOW (ref 6–20)
CO2: 25 mmol/L (ref 22–32)
Calcium: 8.4 mg/dL — ABNORMAL LOW (ref 8.9–10.3)
Chloride: 108 mmol/L (ref 101–111)
Creatinine, Ser: 0.85 mg/dL (ref 0.61–1.24)
GFR calc Af Amer: >60 mL/min (ref >60)
GFR calc non Af Amer: >60 mL/min (ref >60)
Glucose, Bld: 95 mg/dL (ref 65–99)
Potassium: 3.2 mmol/L — ABNORMAL LOW (ref 3.5–5.1)
Sodium: 140 mmol/L (ref 135–145)

## 2017-04-11 LAB — GLUCOSE, CAPILLARY: Glucose-Capillary: 90 mg/dL (ref 65–99)

## 2017-04-11 LAB — SURGICAL PCR SCREEN
MRSA, PCR: NEGATIVE
Staphylococcus aureus: NEGATIVE

## 2017-04-11 SURGERY — EXCISION, CECUM WITH ILEUM, LAPAROSCOPIC
Anesthesia: General | Site: Abdomen

## 2017-04-11 MED ORDER — LIDOCAINE 2% (20 MG/ML) 5 ML SYRINGE
INTRAMUSCULAR | Status: AC
  Filled 2017-04-11: qty 5

## 2017-04-11 MED ORDER — PIPERACILLIN-TAZOBACTAM 3.375 G IVPB
INTRAVENOUS | Status: AC
  Filled 2017-04-11: qty 50

## 2017-04-11 MED ORDER — ONDANSETRON HCL 4 MG/2ML IJ SOLN
INTRAMUSCULAR | Status: AC
  Filled 2017-04-11: qty 2

## 2017-04-11 MED ORDER — METRONIDAZOLE IN NACL 5-0.79 MG/ML-% IV SOLN
500.0000 mg | Freq: Three times a day (TID) | INTRAVENOUS | Status: DC
  Administered 2017-04-12 – 2017-04-13 (×5): 500 mg via INTRAVENOUS
  Filled 2017-04-11 (×12): qty 100

## 2017-04-11 MED ORDER — ROCURONIUM BROMIDE 100 MG/10ML IV SOLN
INTRAVENOUS | Status: DC | PRN
  Administered 2017-04-11 (×3): 10 mg via INTRAVENOUS
  Administered 2017-04-11: 50 mg via INTRAVENOUS
  Administered 2017-04-11: 20 mg via INTRAVENOUS

## 2017-04-11 MED ORDER — ENOXAPARIN SODIUM 40 MG/0.4ML ~~LOC~~ SOLN
40.0000 mg | Freq: Every day | SUBCUTANEOUS | Status: DC
  Administered 2017-04-12 – 2017-04-17 (×6): 40 mg via SUBCUTANEOUS
  Filled 2017-04-11 (×7): qty 0.4

## 2017-04-11 MED ORDER — LACTATED RINGERS IV SOLN: INTRAVENOUS | Status: DC

## 2017-04-11 MED ORDER — FENTANYL CITRATE (PF) 100 MCG/2ML IJ SOLN
25.0000 ug | INTRAMUSCULAR | Status: DC | PRN
  Administered 2017-04-11 (×3): 50 ug via INTRAVENOUS

## 2017-04-11 MED ORDER — DEXAMETHASONE SODIUM PHOSPHATE 10 MG/ML IJ SOLN
INTRAMUSCULAR | Status: DC | PRN
  Administered 2017-04-11: 10 mg via INTRAVENOUS

## 2017-04-11 MED ORDER — LACTATED RINGERS IR SOLN
Status: DC | PRN
  Administered 2017-04-11: 2000 mL

## 2017-04-11 MED ORDER — BUPIVACAINE-EPINEPHRINE 0.25% -1:200000 IJ SOLN
INTRAMUSCULAR | Status: DC | PRN
  Administered 2017-04-11: 45 mL

## 2017-04-11 MED ORDER — FENTANYL CITRATE (PF) 100 MCG/2ML IJ SOLN
INTRAMUSCULAR | Status: AC
  Filled 2017-04-11: qty 2

## 2017-04-11 MED ORDER — DEXTROSE 5 % IV SOLN
2.0000 g | INTRAVENOUS | Status: DC
  Administered 2017-04-12 – 2017-04-16 (×5): 2 g via INTRAVENOUS
  Filled 2017-04-11 (×6): qty 2

## 2017-04-11 MED ORDER — SUGAMMADEX SODIUM 200 MG/2ML IV SOLN
INTRAVENOUS | Status: DC | PRN
  Administered 2017-04-11: 200 mg via INTRAVENOUS

## 2017-04-11 MED ORDER — LIDOCAINE HCL (CARDIAC) 20 MG/ML IV SOLN
INTRAVENOUS | Status: DC | PRN
  Administered 2017-04-11: 100 mg via INTRAVENOUS

## 2017-04-11 MED ORDER — FENTANYL CITRATE (PF) 100 MCG/2ML IJ SOLN
INTRAMUSCULAR | Status: AC
  Filled 2017-04-11: qty 4

## 2017-04-11 MED ORDER — ROCURONIUM BROMIDE 50 MG/5ML IV SOSY
PREFILLED_SYRINGE | INTRAVENOUS | Status: AC
  Filled 2017-04-11: qty 5

## 2017-04-11 MED ORDER — BUPIVACAINE-EPINEPHRINE 0.25% -1:200000 IJ SOLN
INTRAMUSCULAR | Status: AC
  Filled 2017-04-11: qty 1

## 2017-04-11 MED ORDER — FENTANYL CITRATE (PF) 250 MCG/5ML IJ SOLN
INTRAMUSCULAR | Status: AC
  Filled 2017-04-11: qty 5

## 2017-04-11 MED ORDER — LACTATED RINGERS IV SOLN
INTRAVENOUS | Status: DC
  Administered 2017-04-11 (×3): via INTRAVENOUS

## 2017-04-11 MED ORDER — ONDANSETRON HCL 4 MG/2ML IJ SOLN
INTRAMUSCULAR | Status: DC | PRN
  Administered 2017-04-11: 4 mg via INTRAVENOUS

## 2017-04-11 MED ORDER — 0.9 % SODIUM CHLORIDE (POUR BTL) OPTIME
TOPICAL | Status: DC | PRN
  Administered 2017-04-11: 4000 mL

## 2017-04-11 MED ORDER — FENTANYL CITRATE (PF) 100 MCG/2ML IJ SOLN
INTRAMUSCULAR | Status: DC | PRN
  Administered 2017-04-11: 100 ug via INTRAVENOUS
  Administered 2017-04-11 (×3): 50 ug via INTRAVENOUS
  Administered 2017-04-11: 100 ug via INTRAVENOUS
  Administered 2017-04-11 (×2): 50 ug via INTRAVENOUS

## 2017-04-11 MED ORDER — SUGAMMADEX SODIUM 200 MG/2ML IV SOLN
INTRAVENOUS | Status: AC
Start: 2017-04-11 — End: 2017-04-11
  Filled 2017-04-11: qty 2

## 2017-04-11 MED ORDER — HYDROMORPHONE HCL-NACL 0.5-0.9 MG/ML-% IV SOSY
PREFILLED_SYRINGE | INTRAVENOUS | Status: AC
  Administered 2017-04-11: 0.25 mg via INTRAVENOUS
  Filled 2017-04-11: qty 2

## 2017-04-11 MED ORDER — METOCLOPRAMIDE HCL 5 MG/ML IJ SOLN
10.0000 mg | Freq: Once | INTRAMUSCULAR | Status: AC | PRN
  Administered 2017-04-11: 10 mg via INTRAVENOUS

## 2017-04-11 MED ORDER — MORPHINE SULFATE (PF) 2 MG/ML IV SOLN
1.0000 mg | INTRAVENOUS | Status: DC | PRN
  Administered 2017-04-11 – 2017-04-12 (×7): 2 mg via INTRAVENOUS
  Filled 2017-04-11 (×8): qty 1

## 2017-04-11 MED ORDER — HYDROMORPHONE HCL-NACL 0.5-0.9 MG/ML-% IV SOSY
PREFILLED_SYRINGE | INTRAVENOUS | Status: AC
  Filled 2017-04-11: qty 1

## 2017-04-11 MED ORDER — DEXAMETHASONE SODIUM PHOSPHATE 10 MG/ML IJ SOLN
INTRAMUSCULAR | Status: AC
  Filled 2017-04-11: qty 1

## 2017-04-11 MED ORDER — KETAMINE HCL 10 MG/ML IJ SOLN
INTRAMUSCULAR | Status: AC
  Filled 2017-04-11: qty 1

## 2017-04-11 MED ORDER — SCOPOLAMINE 1 MG/3DAYS TD PT72
MEDICATED_PATCH | TRANSDERMAL | Status: AC
  Filled 2017-04-11: qty 1

## 2017-04-11 MED ORDER — MEPERIDINE HCL 50 MG/ML IJ SOLN: 6.2500 mg | INTRAMUSCULAR | Status: DC | PRN

## 2017-04-11 MED ORDER — SCOPOLAMINE 1 MG/3DAYS TD PT72
MEDICATED_PATCH | TRANSDERMAL | Status: DC | PRN
  Administered 2017-04-11: 1 via TRANSDERMAL

## 2017-04-11 MED ORDER — HYDROMORPHONE HCL-NACL 0.5-0.9 MG/ML-% IV SOSY
0.2500 mg | PREFILLED_SYRINGE | INTRAVENOUS | Status: DC | PRN
  Administered 2017-04-11: 0.5 mg via INTRAVENOUS
  Administered 2017-04-11: 0.25 mg via INTRAVENOUS

## 2017-04-11 MED ORDER — MIDAZOLAM HCL 5 MG/5ML IJ SOLN
INTRAMUSCULAR | Status: DC | PRN
  Administered 2017-04-11: 2 mg via INTRAVENOUS

## 2017-04-11 MED ORDER — MIDAZOLAM HCL 2 MG/2ML IJ SOLN
INTRAMUSCULAR | Status: AC
  Filled 2017-04-11: qty 2

## 2017-04-11 MED ORDER — PROPOFOL 10 MG/ML IV BOLUS
INTRAVENOUS | Status: AC
  Filled 2017-04-11: qty 20

## 2017-04-11 MED ORDER — METOCLOPRAMIDE HCL 5 MG/ML IJ SOLN
INTRAMUSCULAR | Status: AC
  Filled 2017-04-11: qty 2

## 2017-04-11 MED ORDER — DEXTROSE-NACL 5-0.9 % IV SOLN
INTRAVENOUS | Status: DC
  Administered 2017-04-11 – 2017-04-14 (×4): via INTRAVENOUS
  Administered 2017-04-14: 1000 mL via INTRAVENOUS
  Administered 2017-04-15: 23:00:00 via INTRAVENOUS

## 2017-04-11 MED ORDER — KETAMINE HCL 10 MG/ML IJ SOLN
INTRAMUSCULAR | Status: DC | PRN
  Administered 2017-04-11: 40 mg via INTRAVENOUS

## 2017-04-11 MED ORDER — PIPERACILLIN-TAZOBACTAM 3.375 G IVPB 30 MIN
3.3750 g | Freq: Once | INTRAVENOUS | Status: AC
  Administered 2017-04-11: 3.375 g via INTRAVENOUS

## 2017-04-11 MED ORDER — PROPOFOL 10 MG/ML IV BOLUS
INTRAVENOUS | Status: DC | PRN
  Administered 2017-04-11: 200 mg via INTRAVENOUS

## 2017-04-11 SURGICAL SUPPLY — 80 items
ADH SKN CLS APL DERMABOND .7 (GAUZE/BANDAGES/DRESSINGS) ×1
APPLIER CLIP 5 13 M/L LIGAMAX5 (MISCELLANEOUS)
APR CLP MED LRG 5 ANG JAW (MISCELLANEOUS)
BLADE EXTENDED COATED 6.5IN (ELECTRODE) IMPLANT
CABLE HIGH FREQUENCY MONO STRZ (ELECTRODE) ×2 IMPLANT
CELLS DAT CNTRL 66122 CELL SVR (MISCELLANEOUS) IMPLANT
CHLORAPREP W/TINT 26ML (MISCELLANEOUS) ×2 IMPLANT
CLIP APPLIE 5 13 M/L LIGAMAX5 (MISCELLANEOUS) IMPLANT
COUNTER NEEDLE 20 DBL MAG RED (NEEDLE) ×2 IMPLANT
COVER MAYO STAND STRL (DRAPES) ×6 IMPLANT
DECANTER SPIKE VIAL GLASS SM (MISCELLANEOUS) ×3 IMPLANT
DERMABOND ADVANCED (GAUZE/BANDAGES/DRESSINGS) ×1
DERMABOND ADVANCED .7 DNX12 (GAUZE/BANDAGES/DRESSINGS) ×1 IMPLANT
DRAIN CHANNEL 19F RND (DRAIN) IMPLANT
DRAPE LAPAROSCOPIC ABDOMINAL (DRAPES) ×2 IMPLANT
DRAPE SURG IRRIG POUCH 19X23 (DRAPES) ×2 IMPLANT
DRSG OPSITE POSTOP 4X10 (GAUZE/BANDAGES/DRESSINGS) IMPLANT
DRSG OPSITE POSTOP 4X6 (GAUZE/BANDAGES/DRESSINGS) ×1 IMPLANT
DRSG OPSITE POSTOP 4X8 (GAUZE/BANDAGES/DRESSINGS) IMPLANT
ELECT PENCIL ROCKER SW 15FT (MISCELLANEOUS) ×4 IMPLANT
ELECT REM PT RETURN 15FT ADLT (MISCELLANEOUS) ×2 IMPLANT
EVACUATOR SILICONE 100CC (DRAIN) IMPLANT
GAUZE SPONGE 4X4 12PLY STRL (GAUZE/BANDAGES/DRESSINGS) IMPLANT
GLOVE BIO SURGEON STRL SZ 6.5 (GLOVE) ×6 IMPLANT
GLOVE BIOGEL PI IND STRL 7.0 (GLOVE) ×2 IMPLANT
GLOVE BIOGEL PI INDICATOR 7.0 (GLOVE) ×3
GLOVE SURG SS PI 6.5 STRL IVOR (GLOVE) ×2 IMPLANT
GOWN STRL REUS W/TWL 2XL LVL3 (GOWN DISPOSABLE) ×4 IMPLANT
GOWN STRL REUS W/TWL LRG LVL3 (GOWN DISPOSABLE) ×2 IMPLANT
GOWN STRL REUS W/TWL XL LVL3 (GOWN DISPOSABLE) ×8 IMPLANT
GRASPER ENDOPATH ANVIL 10MM (MISCELLANEOUS) IMPLANT
HOLDER FOLEY CATH W/STRAP (MISCELLANEOUS) ×2 IMPLANT
IRRIG SUCT STRYKERFLOW 2 WTIP (MISCELLANEOUS) ×2
IRRIGATION SUCT STRKRFLW 2 WTP (MISCELLANEOUS) ×1 IMPLANT
LEGGING LITHOTOMY PAIR STRL (DRAPES) IMPLANT
LUBRICANT JELLY K Y 4OZ (MISCELLANEOUS) ×2 IMPLANT
PACK COLON (CUSTOM PROCEDURE TRAY) ×2 IMPLANT
PAD POSITIONING PINK XL (MISCELLANEOUS) ×2 IMPLANT
PORT LAP GEL ALEXIS MED 5-9CM (MISCELLANEOUS) ×3 IMPLANT
POSITIONER SURGICAL ARM (MISCELLANEOUS) ×2 IMPLANT
RELOAD PROXIMATE 75MM BLUE (ENDOMECHANICALS) ×12 IMPLANT
RELOAD PROXIMATE TA60MM BLUE (ENDOMECHANICALS) ×2 IMPLANT
RELOAD STAPLE 60 BLU REG PROX (ENDOMECHANICALS) IMPLANT
RELOAD STAPLE 75 3.8 BLU REG (ENDOMECHANICALS) IMPLANT
RETRACTOR WND ALEXIS 18 MED (MISCELLANEOUS) IMPLANT
RTRCTR WOUND ALEXIS 18CM MED (MISCELLANEOUS)
SCISSORS LAP 5X35 DISP (ENDOMECHANICALS) ×2 IMPLANT
SEALER TISSUE G2 STRG ARTC 35C (ENDOMECHANICALS) ×1 IMPLANT
SEALER TISSUE X1 CVD JAW (INSTRUMENTS) ×1 IMPLANT
SLEEVE XCEL OPT CAN 5 100 (ENDOMECHANICALS) ×2 IMPLANT
SPONGE DRAIN TRACH 4X4 STRL 2S (GAUZE/BANDAGES/DRESSINGS) IMPLANT
SPONGE LAP 18X18 X RAY DECT (DISPOSABLE) ×4 IMPLANT
STAPLER GUN LINEAR PROX 60 (STAPLE) ×1 IMPLANT
STAPLER PROXIMATE 75MM BLUE (STAPLE) ×1 IMPLANT
STAPLER VISISTAT 35W (STAPLE) IMPLANT
SUT CHROMIC 0 BP (SUTURE) ×2 IMPLANT
SUT ETHILON 2 0 PS N (SUTURE) IMPLANT
SUT NOVA NAB DX-16 0-1 5-0 T12 (SUTURE) ×2 IMPLANT
SUT NOVA NAB GS-21 0 18 T12 DT (SUTURE) ×4 IMPLANT
SUT PDS AB 1 CTX 36 (SUTURE) IMPLANT
SUT PDS AB 1 TP1 96 (SUTURE) IMPLANT
SUT PROLENE 2 0 KS (SUTURE) ×2 IMPLANT
SUT SILK 2 0 (SUTURE) ×2
SUT SILK 2 0 SH CR/8 (SUTURE) ×3 IMPLANT
SUT SILK 2-0 18XBRD TIE 12 (SUTURE) ×1 IMPLANT
SUT SILK 3 0 (SUTURE) ×2
SUT SILK 3 0 SH CR/8 (SUTURE) ×2 IMPLANT
SUT SILK 3-0 18XBRD TIE 12 (SUTURE) ×1 IMPLANT
SUT VIC AB 2-0 SH 18 (SUTURE) ×3 IMPLANT
SUT VIC AB 2-0 SH 27 (SUTURE) ×2
SUT VIC AB 2-0 SH 27X BRD (SUTURE) IMPLANT
SUT VIC AB 4-0 PS2 27 (SUTURE) ×3 IMPLANT
SYS LAPSCP GELPORT 120MM (MISCELLANEOUS)
SYSTEM LAPSCP GELPORT 120MM (MISCELLANEOUS) IMPLANT
TOWEL OR NON WOVEN STRL DISP B (DISPOSABLE) ×2 IMPLANT
TRAY FOLEY W/METER SILVER 16FR (SET/KITS/TRAYS/PACK) IMPLANT
TROCAR BLADELESS OPT 5 100 (ENDOMECHANICALS) ×2 IMPLANT
TROCAR XCEL BLUNT TIP 100MML (ENDOMECHANICALS) IMPLANT
TUBING CONNECTING 10 (TUBING) ×2 IMPLANT
TUBING INSUF HEATED (TUBING) ×2 IMPLANT

## 2017-04-11 NOTE — Anesthesia Postprocedure Evaluation (Signed)
Anesthesia Post Note  Patient: Braxen Dobek  Procedure(s) Performed: Procedure(s) (LRB): LAPAROSCOPIC ILEOCECECTOMY AND SMALL BOWEL RESECTION (N/A)     Patient location during evaluation: PACU Anesthesia Type: General Level of consciousness: awake and alert Pain management: pain level controlled Vital Signs Assessment: post-procedure vital signs reviewed and stable Respiratory status: spontaneous breathing, nonlabored ventilation and respiratory function stable Cardiovascular status: blood pressure returned to baseline and stable Postop Assessment: no signs of nausea or vomiting Anesthetic complications: no    Last Vitals:  Vitals:   04/11/17 1515 04/11/17 1530  BP: (!) 148/81 140/66  Pulse: 90 64  Resp: 12 15  Temp:      Last Pain:  Vitals:   04/11/17 1515  TempSrc:   PainSc: [place]

## 2017-04-11 NOTE — Progress Notes (Signed)
[facility] Progress Note     Subjective: abd pain better  Afebrile, VSS Objective: Vital signs in last 24 hours: Temp:  [97.7 F (36.5 C)-98.8 F (37.1 C)] 97.7 F (36.5 C) (07/12 0621) Pulse Rate:  [66-74] 72 (07/12 0621) Resp:  [14-16] 14 (07/12 0621) BP: (118)/(74-81) 118/76 (07/12 0621) SpO2:  [98 %-100 %] 99 % (07/12 0621) Last BM Date: 04/10/17  Intake/Output from previous day: 07/11 0701 - 07/12 0700 In: 2550 [P.O.:1200; I.V.:1150; IV Piggyback:200] Out: -  Intake/Output this shift: No intake/output data recorded.  PE: Gen:  Alert, NAD, pleasant Card:  Regular rate and rhythm Pulm:  Normal effort Abd: Soft, TTP lower abdomen bilaterally R>L Skin: warm and dry, no rashes  Psych: A&Ox3   Lab Results:   Recent Labs  04/10/17 0533 04/11/17 0451  WBC 6.0 7.1  HGB 12.7* 12.5*  HCT 39.0 38.1*  PLT 245 268   BMET  Recent Labs  04/10/17 0533 04/11/17 0451  NA 142 140  K 3.6 3.2*  CL 107 108  CO2 25 25  GLUCOSE 97 95  BUN <5* <5*  CREATININE 0.89 0.85  CALCIUM 8.3* 8.4*   PT/INR No results for input(s): LABPROT, INR in the last 72 hours. CMP     Component Value Date/Time   NA 140 04/11/2017 0451   K 3.2 (L) 04/11/2017 0451   CL 108 04/11/2017 0451   CO2 25 04/11/2017 0451   GLUCOSE 95 04/11/2017 0451   BUN <5 (L) 04/11/2017 0451   CREATININE 0.85 04/11/2017 0451   CALCIUM 8.4 (L) 04/11/2017 0451   PROT 6.7 04/06/2017 1756   ALBUMIN 2.8 (L) 04/06/2017 1756   AST 24 04/06/2017 1756   ALT 24 04/06/2017 1756   ALKPHOS 118 04/06/2017 1756   BILITOT 0.5 04/06/2017 1756   GFRNONAA >60 04/11/2017 0451   GFRAA >60 04/11/2017 0451   Lipase     Component Value Date/Time   LIPASE 20 04/06/2017 1756   Anti-infectives: Anti-infectives    Start     Dose/Rate Route Frequency Ordered Stop   04/07/17 1000  valACYclovir (VALTREX) tablet 500 mg     500 mg Oral Daily 04/06/17 2333     04/07/17 0600  piperacillin-tazobactam (ZOSYN)  IVPB 3.375 g     3.375 g 12.5 mL/hr over 240 Minutes Intravenous Every 8 hours 04/06/17 2353     04/06/17 2115  piperacillin-tazobactam (ZOSYN) IVPB 3.375 g     3.375 g 100 mL/hr over 30 Minutes Intravenous  Once 04/06/17 2110 04/06/17 2220     Assessment/Plan Crohn's ileitis with fistula formation, mesenteric abscess             - on 6MP and Humira              - continue IV abx for intraabdominal infection and bacteremia.             - plain for OR today for surgical resection.  - GI recommends continuing Humira postoperatively (next injection scheduled 7/15) as well as 6 MP; follow up with Dr Therisa Doyne as an outpatient             - pain control  CT reviewed.  Mesenteric abscess decreasing in size.  I estimate ~25cm bowel resection with possible additional bowel resection for stricture.  I have discussed this with the patient.  Specific risks of increased bleeding and anastomotic leak discussed with the patient in detail.  Other risks of infection, hernia and need for additional  surgery discussed as well.  He understands we will use an ileostomy as a last resort.    FEN: NPO ID: Zosyn 7/8 >>; leukocytosis resolved; streptococcus bacteremia   VTE: Lovenox, SCD's    LOS: 5 days    [facility] 04/11/2017, 7:40 AM Pager: [phone] Consults: [phone] Mon-Fri 7:00 am-4:30 pm Sat-Sun 7:00 am-11:30 am

## 2017-04-11 NOTE — Transfer of Care (Signed)
Immediate Anesthesia Transfer of Care Note  Patient: Nathan Shaw  Procedure(s) Performed: Procedure(s): LAPAROSCOPIC ILEOCECECTOMY AND SMALL BOWEL RESECTION (N/A)  Patient Location: PACU  Anesthesia Type:General  Level of Consciousness: awake, alert  and oriented  Airway & Oxygen Therapy: Patient Spontanous Breathing and Patient connected to face mask oxygen  Post-op Assessment: Report given to RN and Post -op Vital signs reviewed and stable  Post vital signs: Reviewed and stable  Last Vitals:  Vitals:   04/11/17 0621 04/11/17 1000  BP: 118/76 131/78  Pulse: 72 84  Resp: 14 14  Temp: 36.5 C 37 C    Last Pain:  Vitals:   04/11/17 1000  TempSrc: Oral  PainSc:       Patients Stated Pain Goal: 3 (50/03/70 4888)  Complications: No apparent anesthesia complications

## 2017-04-11 NOTE — Anesthesia Procedure Notes (Signed)
Procedure Name: Intubation Date/Time: 04/11/2017 11:35 AM Performed by: Glory Buff Pre-anesthesia Checklist: Patient identified, Emergency Drugs available, Suction available and Patient being monitored Patient Re-evaluated:Patient Re-evaluated prior to induction Oxygen Delivery Method: Circle system utilized Preoxygenation: Pre-oxygenation with 100% oxygen Induction Type: IV induction Ventilation: Mask ventilation without difficulty Laryngoscope Size: Miller and 3 Grade View: Grade I Tube type: Oral Tube size: 7.5 mm Number of attempts: 1 Airway Equipment and Method: Stylet and Oral airway Placement Confirmation: ETT inserted through vocal cords under direct vision,  positive ETCO2 and breath sounds checked- equal and bilateral Secured at: 21 cm Tube secured with: Tape Dental Injury: Teeth and Oropharynx as per pre-operative assessment

## 2017-04-11 NOTE — Progress Notes (Signed)
PROGRESS NOTE    [place]  FYB:[id number] DOB: 1955/11/01 DOA: 04/06/2017 PCP: Hulan Fess, MD    Brief Narrative: 61 year old male who presented with abdominal cramping. Patient is known to have Crohn's disease, GERD, hypogonadism. About 4 weeks ago he had a episode of Crohn's flare, treated with 6-MP, with improvement of his symptoms. The day of admission he had acute recurrence of abdominal pain, colicky in nature, severe in intensity and worsening, localized in the right lower quadrant, then he presented to the hospital due to worsening symptoms. He was recently treated for an episode of prostatitis with ciprofloxacin. On initial physical examination blood pressure 136/89, heart rate 85, respiratory rate 16, temperature 98.2, oxygen saturation 100%, mucous membranes were moist, lungs were clear to auscultation bilaterally, heart S1-S2 present rhythmic, the abdomen was distended, had hypoactive bowel sounds, tender to palpation in the upper quadrants and more pronounced on the right lower quadrant, positive guarding at that site. Sodium 139, potassium 3.7, chloride 105, bicarbonate 25, glucose 121, BUN 8, creatinine 0.87, AST 24, ALT 24, white count 11.9, hemoglobin 13.6, hematocrit 40.7, platelets 360, urinalysis negative for infection. 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   Patient was admitted to medical floor with working diagnosis of acute Crohn's flare complicated by mesenteric abscess.   Assessment & Plan:   Active Problems:   GERD (gastroesophageal reflux disease)   Crohn's ileitis, with abscess ([place])   1. Crohn's flare with mesenteric abscess, with viridians strp. bacteremia  for surgery today. We'll continue antibiotic therapy with ceftriaxone and metronidazole, follow-up on cultures, cell count and temperature curve. Follow-up on surgery recommendations..    2. History of alcohol abuse.  Neuro checks per unit protocol, no anxiety.   3. Hypogonadism.  topical testosterone, with good toleration.   4. History of herpes. valacyclovir,   DVT prophylaxis:enoxaparin  Code Status:full  Family Communication: Disposition Plan:home    Consultants:  Surgery  Gastroenterology   Procedures:   Antimicrobials:   Ceftriaxone  Metronidazole   Subjective: Patient Foster procedure, somnolent, no acute pain, dyspnea or chest pain  Objective: Vitals:   04/11/17 1530 04/11/17 1539 04/11/17 1540 04/11/17 1552  BP: 140/66  (!) 145/78 135/77  Pulse: 64 77 88 60  Resp: 15 17 16 14   Temp: 98.7 F (37.1 C)   98.6 F (37 C)  TempSrc:      SpO2: 99% 99% 98% 100%  Weight:      Height:        Intake/Output Summary (Last 24 hours) at 04/11/17 1631 Last data filed at 04/11/17 1539  Gross per 24 hour  Intake             3830 ml  Output             1150 ml  Net             2680 ml   Filed Weights   04/07/17 0115  Weight: 83.6 kg (184 lb 4.9 oz)    Examination:  General exam: Deconditioning E ENT: Mild pallor, no icterus, dry mucosa, NG tube in place Respiratory system: Clear to auscultation. Respiratory effort normal. No wheezing rales or rhonchi Cardiovascular system: S1 & S2 heard, RRR. No JVD, murmurs, rubs, gallops or clicks. No pedal edema. Gastrointestinal system: Abdomen is nondistended, soft and nontender. No organomegaly or masses felt. Normal bowel sounds heard. Central nervous system: Alert and oriented.  No focal neurological deficits. Extremities: Symmetric 5 x 5 power. Skin: No rashes, lesions or ulcers    Data Reviewed: I have personally reviewed following labs and imaging studies  CBC:  Recent Labs Lab 04/06/17 1757 04/07/17 0100 04/08/17 0503 04/10/17 0533 04/11/17 0451  WBC 11.9* 11.0* 12.3* 6.0 7.1  NEUTROABS 8.9*  --  9.0* 3.5 4.5  HGB 13.6 13.2 14.9 12.7* 12.5*  HCT 40.7 39.9 43.9  39.0 38.1*  MCV 83.6 83.6 83.0 83.7 83.7  PLT 362 367 331 245 956   Basic Metabolic Panel:  Recent Labs Lab 04/06/17 1756 04/07/17 0100 04/08/17 0503 04/10/17 0533 04/11/17 0451  NA 139 140 135 142 140  K 3.7 3.7 4.2 3.6 3.2*  CL 105 104 105 107 108  CO2 25 26 23 25 25   GLUCOSE 121* 91 143* 97 95  BUN 8 6 6  <5* <5*  CREATININE 0.87 0.93 0.87 0.89 0.85  CALCIUM 8.9 8.8* 8.4* 8.3* 8.4*   GFR: Estimated Creatinine Clearance: 100.9 mL/min (by C-G formula based on SCr of 0.85 mg/dL). Liver Function Tests:  Recent Labs Lab 04/06/17 1756  AST 24  ALT 24  ALKPHOS 118  BILITOT 0.5  PROT 6.7  ALBUMIN 2.8*    Recent Labs Lab 04/06/17 1756  LIPASE 20   No results for input(s): AMMONIA in the last 168 hours. Coagulation Profile: No results for input(s): INR, PROTIME in the last 168 hours. Cardiac Enzymes: No results for input(s): CKTOTAL, CKMB, CKMBINDEX, TROPONINI in the last 168 hours. BNP (last 3 results) No results for input(s): PROBNP in the last 8760 hours. HbA1C: No results for input(s): HGBA1C in the last 72 hours. CBG:  Recent Labs Lab 04/07/17 0709 04/08/17 0721 04/09/17 0728 04/10/17 0709 04/11/17 0711  GLUCAP 91 133* 104* 96 90   Lipid Profile: No results for input(s): CHOL, HDL, LDLCALC, TRIG, CHOLHDL, LDLDIRECT in the last 72 hours. Thyroid Function Tests: No results for input(s): TSH, T4TOTAL, FREET4, T3FREE, THYROIDAB in the last 72 hours. Anemia Panel: No results for input(s): VITAMINB12, FOLATE, FERRITIN, TIBC, IRON, RETICCTPCT in the last 72 hours. Sepsis Labs: No results for input(s): PROCALCITON, LATICACIDVEN in the last 168 hours.  Recent Results (from the past 240 hour(s))  Culture, blood (routine x 2)     Status: Abnormal   Collection Time: 04/07/17  1:00 AM  Result Value Ref Range Status   Specimen Description BLOOD BLOOD LEFT HAND  Final   Special Requests   Final    BOTTLES DRAWN AEROBIC ONLY Blood Culture adequate volume    Culture  Setup Time   Final    GRAM POSITIVE COCCI IN CHAINS AEROBIC BOTTLE ONLY CRITICAL RESULT CALLED TO, READ BACK BY AND VERIFIED WITH: M LILLISTON,PHARMD AT 1023 04/08/17 BY L BENFIELD    Culture (A)  Final    STREPTOCOCCUS PARASANGUINIS THE SIGNIFICANCE OF ISOLATING THIS ORGANISM FROM A SINGLE SET OF BLOOD CULTURES WHEN MULTIPLE SETS ARE DRAWN IS UNCERTAIN. PLEASE NOTIFY THE MICROBIOLOGY DEPARTMENT WITHIN ONE WEEK IF SPECIATION AND SENSITIVITIES ARE REQUIRED. Performed at [facility], [place] [address] [place], [place] [address]    Report Status 04/10/2017 FINAL  Final  Culture, blood (routine x 2)     Status: Abnormal   Collection Time: 04/07/17  1:00 AM  Result Value Ref Range Status   Specimen Description BLOOD RIGHT ANTECUBITAL  Final   Special Requests   Final    BOTTLES DRAWN AEROBIC ONLY Blood Culture adequate volume   Culture  Setup Time   Final    GRAM POSITIVE COCCI IN CHAINS AEROBIC BOTTLE ONLY CRITICAL RESULT CALLED TO, READ BACK BY AND VERIFIED WITH: M LILLISTON,PHARMD AT 1023 04/08/17 BY L BENFIELD    Culture (A)  Final    STREPTOCOCCUS CRISTATUS THE SIGNIFICANCE OF ISOLATING THIS ORGANISM FROM A SINGLE SET OF BLOOD CULTURES WHEN MULTIPLE SETS ARE DRAWN IS UNCERTAIN. PLEASE NOTIFY THE MICROBIOLOGY DEPARTMENT WITHIN ONE WEEK IF SPECIATION AND SENSITIVITIES ARE REQUIRED. Performed at [facility], [place] [address] [place], [place] [address]    Report Status 04/10/2017 FINAL  Final  Blood Culture ID Panel (Reflexed)     Status: Abnormal   Collection Time: 04/07/17  1:00 AM  Result Value Ref Range Status   Enterococcus species NOT DETECTED NOT DETECTED Final   Listeria monocytogenes NOT DETECTED NOT DETECTED Final   Staphylococcus species NOT DETECTED NOT DETECTED Final   Staphylococcus aureus NOT DETECTED NOT DETECTED Final   Streptococcus species DETECTED (A) NOT DETECTED Final    Comment: Not Enterococcus species, Streptococcus agalactiae, Streptococcus  pyogenes, or Streptococcus pneumoniae. CRITICAL RESULT CALLED TO, READ BACK BY AND VERIFIED WITH: M LILLISTON,PHARMD AT 1023 04/08/17 BY L BENFIELD    Streptococcus agalactiae NOT DETECTED NOT DETECTED Final   Streptococcus pneumoniae NOT DETECTED NOT DETECTED Final   Streptococcus pyogenes NOT DETECTED NOT DETECTED Final   Acinetobacter baumannii NOT DETECTED NOT DETECTED Final   Enterobacteriaceae species NOT DETECTED NOT DETECTED Final   Enterobacter cloacae complex NOT DETECTED NOT DETECTED Final   Escherichia coli NOT DETECTED NOT DETECTED Final   Klebsiella oxytoca NOT DETECTED NOT DETECTED Final   Klebsiella pneumoniae NOT DETECTED NOT DETECTED Final   Proteus species NOT DETECTED NOT DETECTED Final   Serratia marcescens NOT DETECTED NOT DETECTED Final   Haemophilus influenzae NOT DETECTED NOT DETECTED Final   Neisseria meningitidis NOT DETECTED NOT DETECTED Final   Pseudomonas aeruginosa NOT DETECTED NOT DETECTED Final   Candida albicans NOT DETECTED NOT DETECTED Final   Candida glabrata NOT DETECTED NOT DETECTED Final   Candida krusei NOT DETECTED NOT DETECTED Final   Candida parapsilosis NOT DETECTED NOT DETECTED Final   Candida tropicalis NOT DETECTED NOT DETECTED Final    Comment: Performed at [facility], [place]. [address] [place], [place] [address]  Culture, blood (Routine X 2) w Reflex to ID Panel     Status: None (Preliminary result)   Collection Time: 04/09/17  3:22 PM  Result Value Ref Range Status   Specimen Description BLOOD RIGHT HAND  Final   Special Requests   Final    BOTTLES DRAWN AEROBIC AND ANAEROBIC Blood Culture adequate volume   Culture   Final    NO GROWTH 2 DAYS Performed at [facility], [place] [address], [place], [place] [address]    Report Status PENDING  Incomplete  Culture, blood (Routine X 2) w Reflex to ID Panel     Status: None (Preliminary result)   Collection Time: 04/09/17  5:33 PM  Result Value Ref Range Status   Specimen  Description BLOOD LEFT HAND  Final   Special Requests   Final    BOTTLES DRAWN AEROBIC ONLY Blood Culture adequate volume   Culture   Final    NO GROWTH 2 DAYS Performed at [facility], [place] [address], [place],  [address]    Report Status PENDING  Incomplete  Surgical pcr screen     Status: None   Collection Time:  04/11/17 10:06 AM  Result Value Ref Range Status   MRSA, PCR NEGATIVE NEGATIVE Final   Staphylococcus aureus NEGATIVE NEGATIVE Final    Comment:        The Xpert SA Assay (FDA approved for NASAL specimens in patients over 47 years of age), is one component of a comprehensive surveillance program.  Test performance has been validated by [facility] for patients greater than or equal to 51 year old. It is not intended to diagnose infection nor to guide or monitor treatment.          Radiology Studies: Ct Abdomen Pelvis W Contrast  Result Date: 04/10/2017 CLINICAL DATA:  Abdominal cramping Crohn's disease EXAM: CT ABDOMEN AND PELVIS WITH CONTRAST TECHNIQUE: Multidetector CT imaging of the abdomen and pelvis was performed using the standard protocol following bolus administration of intravenous contrast. CONTRAST:  60mL ISOVUE-300 IOPAMIDOL (ISOVUE-300) INJECTION 61%, 1110mL ISOVUE-300 IOPAMIDOL (ISOVUE-300) INJECTION 61% COMPARISON:  04/06/2017, 03/19/2017 FINDINGS: Lower chest: Lung bases demonstrate no acute consolidation or pleural effusion. Nonenlarged heart. Hepatobiliary: Hepatic steatosis. No calcified gallstones or biliary dilatation. subcentimeter hypodensity in the dome of the liver. Pancreas: Unremarkable. No pancreatic ductal dilatation or surrounding inflammatory changes. Spleen: Normal in size without focal abnormality. Adrenals/Urinary Tract: Adrenal glands are unremarkable. Kidneys are normal, without renal calculi, focal lesion, or hydronephrosis. Bladder is unremarkable. Stomach/Bowel: Stomach is nonenlarged. Prominent submucosal fat in the  duodenum as noted previously, possibly related to prior bouts of inflammation. Long segment small bowel thickening of the distal ileum in the right lower quadrant and upper pelvis consistent with ileitis with moderate residual surrounding inflammation in the mesenteries he no overall decreased compared to the most recent prior CT. 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 This is contiguous with gas and fluid collection in the right lower quadrant mesentery measuring 3.9 x 1.9 cm consistent with abscess, and decreased compared to previous measurements of 4.9 x 4.7 by 3.4 cm. Contrast migrates beyond the thickens loop of ileum and there is contrast present within the colon. Additional thickened mid ileal bowel loop, series 2, image number 30. Vascular/Lymphatic: Multiple right lower quadrant lymph nodes. Atherosclerotic vascular disease of the aorta. Reproductive: Prostate is unremarkable. Other: No free air. Small free fluid in the pelvis and lower abdomen. Musculoskeletal: No acute or suspicious bone lesion. Stable wedging of L2. IMPRESSION: 1. Findings consistent with distal ileitis with overall decreased inflammation surrounding the thickened small bowel loops in the right lower quadrant. 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 Decreasing inflammation in the right lower quadrant mesentery compared to prior. 2. Small amount of ascites in the abdomen and pelvis, increased compared to prior Electronically Signed   By: Donavan Foil M.D.   On: 04/10/2017 22:31        Scheduled Meds: . azaTHIOprine  100 mg Oral Daily  . cholecalciferol  2,000 Units Oral Daily  . [START ON 04/12/2017] enoxaparin (LOVENOX) injection  40 mg Subcutaneous QHS  . HYDROmorphone      . loratadine  10 mg Oral Daily  . mouth rinse  15 mL Mouth Rinse  [id number]  . metoCLOPramide      . omega-3 acid ethyl esters  1,000 mg Oral Daily  . scopolamine      . testosterone  5 g Transdermal Daily  . valACYclovir  500 mg  Oral Daily   Continuous Infusions: . cefTRIAXone (ROCEPHIN)  IV    . dextrose 5 % and 0.9% NaCl    . famotidine (PEPCID) IV Stopped (04/10/17 1027)  . metronidazole       LOS: 5 days      Tawni Millers, MD [facility] Pager [phone]  If 7PM-7AM, please contact night-coverage [email or web address] Password [facility] 04/11/2017, 4:31 PM

## 2017-04-11 NOTE — Anesthesia Preprocedure Evaluation (Addendum)
Anesthesia Evaluation  Patient identified by MRN, date of birth, ID band Patient awake    Reviewed: Allergy & Precautions, NPO status , Patient's Chart, lab work & pertinent test results  Airway Mallampati: II  TM Distance: >3 FB Neck ROM: Full    Dental no notable dental hx. (+) Caps   Pulmonary neg pulmonary ROS, former smoker,    Pulmonary exam normal breath sounds clear to auscultation       Cardiovascular negative cardio ROS Normal cardiovascular exam Rhythm:Regular Rate:Normal     Neuro/Psych negative neurological ROS  negative psych ROS   GI/Hepatic negative GI ROS, Neg liver ROS,   Endo/Other  negative endocrine ROS  Renal/GU negative Renal ROS  negative genitourinary   Musculoskeletal negative musculoskeletal ROS (+)   Abdominal   Peds negative pediatric ROS (+)  Hematology negative hematology ROS (+)   Anesthesia Other Findings   Reproductive/Obstetrics negative OB ROS                             Anesthesia Physical Anesthesia Plan  ASA: II  Anesthesia Plan: General   Post-op Pain Management:    Induction: Intravenous  PONV Risk Score and Plan: 4 or greater and Ondansetron, Dexamethasone, Propofol, Midazolam and Scopolamine patch - Pre-op  Airway Management Planned: Oral ETT  Additional Equipment:   Intra-op Plan:   Post-operative Plan: Extubation in OR  Informed Consent: I have reviewed the patients History and Physical, chart, labs and discussed the procedure including the risks, benefits and alternatives for the proposed anesthesia with the patient or authorized representative who has indicated his/her understanding and acceptance.   Dental advisory given  Plan Discussed with: CRNA  Anesthesia Plan Comments:         Anesthesia Quick Evaluation

## 2017-04-11 NOTE — Progress Notes (Signed)
Initial Nutrition Assessment  DOCUMENTATION CODES:   Not applicable  INTERVENTION:  - Diet advancement as medically feasible following surgery. - RD will continue to monitor for nutrition-related needs.  NUTRITION DIAGNOSIS:   Inadequate oral intake related to inability to eat as evidenced by NPO status. -revised  GOAL:   Patient will meet greater than or equal to 90% of their needs -unable to meet.   MONITOR:   Diet advancement, Weight trends, Labs, I & O's  ASSESSMENT:   61 year old male who presented with abdominal cramping. Patient is known to have Crohn's disease, GERD, hypogonadism. About 4 weeks ago he had a episode of Crohn's flare, treated with 6-MP, with improvement of his symptoms. The day of admission he had acute recurrence of abdominal pain, colicky in nature, severe in intensity and worsening, localized in the right lower quadrant, then he presented to the hospital due to worsening symptoms. 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  7/12 Pt on CLD from 7/8-7/11 and has been NPO since midnight pending surgery later today. Per doc flow sheet, he consumed 100% of liquids at breakfast yesterday and no other intakes documented since previous assessment. No new weight since 7/8.   Pt reports improvement in abdominal pain. Plan for surgical resection of Crohn's ileitis with fistula formation, mesenteric abscess today. Will monitor for nutrition plan and needs following surgery.   Medications reviewed; 2000 units vitamin D/day, 20 mg IV Pepcid/day, 1000 mg Lovaza/day. Labs reviewed; K: 3.2 mmol/L, BUN: <5 mg/dL, Ca: 8.4 mg/dL.  IVF: D5-NS @ 50 mL/hr (204 kcal from dextrose).    7/9 - Patient on a clear liquid diet.  - Had an episode of emesis last night.  - Per surgery, pt scheduled to have lap resection later this week.  - Pt with crohns ileitis w/  mesenteric abscess and fistula.  - Will monitor for diet tolerance and nutrition needs leading up to surgery. - Per chart review, pt's weight has remained stable.  - No fat or muscle depletion noted.     Diet Order:  Diet NPO time specified Except for: Sips with Meds, Ice Chips  Skin:  Reviewed, no issues  Last BM:  7/11  Height:   Ht Readings from Last 1 Encounters:  04/07/17 5\' 11"  (1.803 m)    Weight:   Wt Readings from Last 1 Encounters:  04/07/17 184 lb 4.9 oz (83.6 kg)    Ideal Body Weight:  78.2 kg  BMI:  Body mass index is 25.71 kg/m.  Estimated Nutritional Needs:   Kcal:  2100-2300  Protein:  100-110g  Fluid:  2.1L/day  EDUCATION NEEDS:   No education needs identified at this time    Nathan Matin, MS, RD, LDN, CNSC Inpatient Clinical Dietitian Pager # [phone] After hours/weekend pager # [phone]

## 2017-04-11 NOTE — Progress Notes (Signed)
[facility] PROGRESS NOTE Subjective patient doing very well. Going to surgery later today. Pain is much improved. Remains on 6 MPN Humira as well as IV antibiotics.  Objective: Vital signs in last 24 hours: Temp:  [97.7 F (36.5 C)-98.8 F (37.1 C)] 97.7 F (36.5 C) (07/12 0621) Pulse Rate:  [66-74] 72 (07/12 0621) Resp:  [14-16] 14 (07/12 0621) BP: (118)/(74-81) 118/76 (07/12 0621) SpO2:  [98 %-100 %] 99 % (07/12 0621) Last BM Date: 04/10/17  Intake/Output from previous day: 07/11 0701 - 07/12 0700 In: 2550 [P.O.:1200; I.V.:1150; IV Piggyback:200] Out: -  Intake/Output this shift: No intake/output data recorded.    Lab Results:  Recent Labs  04/10/17 0533 04/11/17 0451  WBC 6.0 7.1  HGB 12.7* 12.5*  HCT 39.0 38.1*  PLT 245 268   BMET  Recent Labs  04/10/17 0533 04/11/17 0451  NA 142 140  K 3.6 3.2*  CL 107 108  CO2 25 25  CREATININE 0.89 0.85   LFT No results for input(s): PROT, AST, ALT, ALKPHOS, BILITOT, BILIDIR, IBILI in the last 72 hours. PT/INR No results for input(s): LABPROT, INR in the last 72 hours. PANCREAS No results for input(s): LIPASE in the last 72 hours.       Studies/Results: Ct Abdomen Pelvis W Contrast  Result Date: 04/10/2017 CLINICAL DATA:  Abdominal cramping Crohn's disease EXAM: CT ABDOMEN AND PELVIS WITH CONTRAST TECHNIQUE: Multidetector CT imaging of the abdomen and pelvis was performed using the standard protocol following bolus administration of intravenous contrast. CONTRAST:  69mL ISOVUE-300 IOPAMIDOL (ISOVUE-300) INJECTION 61%, 129mL ISOVUE-300 IOPAMIDOL (ISOVUE-300) INJECTION 61% COMPARISON:  04/06/2017, 03/19/2017 FINDINGS: Lower chest: Lung bases demonstrate no acute consolidation or pleural effusion. Nonenlarged heart. Hepatobiliary: Hepatic steatosis. No calcified gallstones or biliary dilatation. subcentimeter hypodensity in the dome of the liver. Pancreas: Unremarkable. No pancreatic ductal dilatation or  surrounding inflammatory changes. Spleen: Normal in size without focal abnormality. Adrenals/Urinary Tract: Adrenal glands are unremarkable. Kidneys are normal, without renal calculi, focal lesion, or hydronephrosis. Bladder is unremarkable. Stomach/Bowel: Stomach is nonenlarged. Prominent submucosal fat in the duodenum as noted previously, possibly related to prior bouts of inflammation. Long segment small bowel thickening of the distal ileum in the right lower quadrant and upper pelvis consistent with ileitis with moderate residual surrounding inflammation in the mesenteries he no overall decreased compared to the most recent prior CT. 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 This is contiguous with gas and fluid collection in the right lower quadrant mesentery measuring 3.9 x 1.9 cm consistent with abscess, and decreased compared to previous measurements of 4.9 x 4.7 by 3.4 cm. Contrast migrates beyond the thickens loop of ileum and there is contrast present within the colon. Additional thickened mid ileal bowel loop, series 2, image number 30. Vascular/Lymphatic: Multiple right lower quadrant lymph nodes. Atherosclerotic vascular disease of the aorta. Reproductive: Prostate is unremarkable. Other: No free air. Small free fluid in the pelvis and lower abdomen. Musculoskeletal: No acute or suspicious bone lesion. Stable wedging of L2. IMPRESSION: 1. Findings consistent with distal ileitis with overall decreased inflammation surrounding the thickened small bowel loops in the right lower quadrant. 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 Decreasing inflammation in the right lower quadrant mesentery compared to prior. 2. Small amount of ascites in the abdomen and pelvis, increased compared to prior  Electronically Signed   By: Donavan Foil M.D.   On: 04/10/2017 22:31    Medications: I have reviewed the patient's current medications.  Assessment/Plan: 1. Crohn's ileitis with fistula and abscess. Patient does have positive blood cultures is going for surgical resection today. Following his surgery would recommend that he be discharged home when feasible to continue Humira and 6 MP as outpatient. Have suggested that he follow-up with Dr Therisa Doyne is an outpatient to obtain Humira level in Humira antibody level prior to injection when stable to determine if he needs to remain on Humira or switch to another biologic or simply remain on 6 MP. I told him I would hold his [place] off of antibiotics for sure. We'll be happy to see him back is needed while in the hospital.   Danyiel Crespin JR,Jhair Witherington L 04/11/2017, 8:41 AM  This note was created using voice recognition software. Minor errors may Have occurred unintentionally.  Pager: [phone] If no answer or after hours call [phone]

## 2017-04-11 NOTE — Op Note (Signed)
04/06/2017 - 04/11/2017  3:49 PM  PATIENT:  Nathan Shaw  61 y.o. male  Patient Care Team: Hulan Fess, MD as PCP - General (Family Medicine) Garlan Fair, MD as Consulting Physician (Gastroenterology)  PRE-OPERATIVE DIAGNOSIS:  Terminal ileitis due to crohn's disease, small bowel stricture  POST-OPERATIVE DIAGNOSIS:  Terminal ileitis due to crohn's disease, small bowel stricture  PROCEDURE:  Procedure(s): LAPAROSCOPIC ILEOCECECTOMY AND SMALL BOWEL RESECTION  SURGEON:  Surgeon(s): Leighton Ruff, MD  ASSISTANT: Izora Gala, MD  ANESTHESIA:   local and general  EBL:  Total I/O In: 2500 [I.V.:2500] Out: 1150 [Urine:700; Blood:450]  SPECIMEN:  Source of Specimen:  terminal ileum, cecum and small bowel  DISPOSITION OF SPECIMEN:  PATHOLOGY  COUNTS:  YES  PLAN OF CARE: inpatient  PATIENT DISPOSITION:  PACU - hemodynamically stable.   INDICATIONS: This is a 61 y.o. male who presented to the hospital with perforated crohn's disease with mesenteric abscess. The risk and benefits and alternative treatments were explained to the patient prior to the OR and the patient has elected to proceed with ileocecectomy and small bowel resection.  Consent was signed and placed on chart prior to the OR.  OR FINDINGS:  Severely inflamed terminal ileum and mid jejunal small bowel stricture.  DESCRIPTION:  The patient was identified & brought into the operating room. The patient was positioned supine with both arms tucked. SCDs were active during the entire case. The patient underwent general anesthesia without any difficulty. A foley catheter was inserted under sterile conditions. The abdomen was prepped and draped in a sterile fashion. A Surgical Timeout confirmed our plan.  I made a vertical incision below the umbilical fold. I dissected down through the subcutaneous tissues using blunt dissection and divided the fascia with cautery.    Blunt dissection was used to obtain peritoneal  entry. I enlarged the peritoneum and then placed an Arpin wound protector. Was placed on the wound protector and we induced carbon dioxide insufflation. I placed additional left-sided 5 mm ports under direct laparoscopic visualization.  I evaluated the entire abdomen laparoscopically.  The liver appeared normal, the large bowel appeared normal as well.  There was an obvious mass of small bowel in the right lower quadrant adherent to the abdominal wall. This was taken down using the comminution of blunt and sharp dissection. I then began to mobilize this away from the left pelvic sidewall using mostly blunt dissection. I elevated the mesentery off of the retroperitoneum and encountered a small abscess cavity which was suctioned away. I mobilized the cecum and right colon off of the right lateral sidewall using the laparoscopic Enseal device.  This was mobilized to the hepatic flexure. Once the terminal ileum and cecum were mobile, the cap was removed off of the wound protector and the specimen was delivered from the wound. I identified the most proximal aspect of inflammation of the terminal ileum and divided this with a blue load GIA 75 mm stapler. The mesentery was divided using an open Enseal device and 0 chromic sutures for added hemostasis. The cecum was separated from the remainder of the ascending colon using a GIA 75 mm blue load stapler. I marked the terminal ileum the suture and then ran the remainder of the small bowel. 3 small strictures were noted in the mid jejunal region. This encompasses approximately a 10 a 15 cm section of bowel. I decided to resect this portion of bowel as there were no other signs of disease within the remaining bowel that I  ran to the ligament of Treitz.  This was resected using 2 blue load GIA staplers. The mesentery was divided using the Enseal device and 0 chromic sutures for added hemostasis. After this was completed the small bowel was anastomosed using a 75 mm GIA stapler.  A TA blue load stapler was used to close the common enterotomy channel. The mesenteric defect was closed using interrupted 3-0 silk sutures and 0 chromic suture. This was then placed back into the abdomen. I then terminated attention to the terminal ileum. This was anastomosed to the ascending colon in a linear fashion. This was done using a 75 mm GIA stapler and a TA blue load stapler to close the common defect. Once again the mesenteric defect was closed using 3-0 silk sutures. This was then placed back into the abdomen. The abdomen was irrigated with approximately 2 L of normal saline. The cap was placed back onto the [place] and the abdomen was evaluated laparoscopically. Hemostasis appeared to be good. The omentum was brought down over the small bowel. The Ubaldo Glassing was removed as well as all the laparoscopic instruments. We then switched to clean gowns, close, drapes and instruments.    I closed the peritoneum with a running 2-0 Vicryl suture.  The fascia was then closed using #1 Novafil interrupted sutures. The subcutaneous tissue of the umbilical incision was closed using interrupted 2-0 Vicryl sutures. The skin was then closed using 4-0 Vicryl sutures. Dermabond was placed on the port sites and a sterile dressing was placed over the abdominal incision. All counts were correct per operating room staff. The patient was then awakened from anesthesia and sent to the post anesthesia care unit in stable condition.

## 2017-04-12 LAB — CBC
HCT: 34.8 % — ABNORMAL LOW (ref 39.0–52.0)
Hemoglobin: 11.4 g/dL — ABNORMAL LOW (ref 13.0–17.0)
MCH: 26.9 pg (ref 26.0–34.0)
MCHC: 32.8 g/dL (ref 30.0–36.0)
MCV: 82.1 fL (ref 78.0–100.0)
Platelets: 344 10*3/uL (ref 150–400)
RBC: 4.24 MIL/uL (ref 4.22–5.81)
RDW: 13.5 % (ref 11.5–15.5)
WBC: 16.5 10*3/uL — ABNORMAL HIGH (ref 4.0–10.5)

## 2017-04-12 LAB — BASIC METABOLIC PANEL
Anion gap: 8 (ref 5–15)
BUN: <5 mg/dL — ABNORMAL LOW (ref 6–20)
CO2: 27 mmol/L (ref 22–32)
Calcium: 7.9 mg/dL — ABNORMAL LOW (ref 8.9–10.3)
Chloride: 104 mmol/L (ref 101–111)
Creatinine, Ser: 0.83 mg/dL (ref 0.61–1.24)
GFR calc Af Amer: >60 mL/min (ref >60)
GFR calc non Af Amer: >60 mL/min (ref >60)
Glucose, Bld: 151 mg/dL — ABNORMAL HIGH (ref 65–99)
Potassium: 3.5 mmol/L (ref 3.5–5.1)
Sodium: 139 mmol/L (ref 135–145)

## 2017-04-12 LAB — GLUCOSE, CAPILLARY: Glucose-Capillary: 125 mg/dL — ABNORMAL HIGH (ref 65–99)

## 2017-04-12 MED ORDER — METHOCARBAMOL 1000 MG/10ML IJ SOLN
500.0000 mg | Freq: Three times a day (TID) | INTRAVENOUS | Status: DC
  Administered 2017-04-12 – 2017-04-18 (×15): 500 mg via INTRAVENOUS
  Filled 2017-04-12 (×3): qty 550
  Filled 2017-04-12 (×2): qty 5
  Filled 2017-04-12 (×2): qty 550
  Filled 2017-04-12 (×2): qty 5
  Filled 2017-04-12 (×2): qty 550
  Filled 2017-04-12: qty 5
  Filled 2017-04-12 (×5): qty 550
  Filled 2017-04-12 (×3): qty 5
  Filled 2017-04-12: qty 550

## 2017-04-12 MED ORDER — ACETAMINOPHEN 10 MG/ML IV SOLN
1000.0000 mg | Freq: Four times a day (QID) | INTRAVENOUS | Status: AC
  Administered 2017-04-12 – 2017-04-13 (×4): 1000 mg via INTRAVENOUS
  Filled 2017-04-12 (×4): qty 100

## 2017-04-12 MED ORDER — DEXTROSE 5 % IV SOLN
825.0000 mg | Freq: Three times a day (TID) | INTRAVENOUS | Status: DC
  Administered 2017-04-12 – 2017-04-15 (×7): 825 mg via INTRAVENOUS
  Filled 2017-04-12 (×10): qty 16.5

## 2017-04-12 MED ORDER — MORPHINE SULFATE (PF) 2 MG/ML IV SOLN
1.0000 mg | INTRAVENOUS | Status: DC | PRN
  Administered 2017-04-12 (×2): 4 mg via INTRAVENOUS
  Administered 2017-04-12 (×2): 3 mg via INTRAVENOUS
  Administered 2017-04-12: 2 mg via INTRAVENOUS
  Administered 2017-04-13 (×3): 4 mg via INTRAVENOUS
  Administered 2017-04-13: 2 mg via INTRAVENOUS
  Administered 2017-04-13: 4 mg via INTRAVENOUS
  Administered 2017-04-13: 2 mg via INTRAVENOUS
  Administered 2017-04-14: 4 mg via INTRAVENOUS
  Administered 2017-04-14: 2 mg via INTRAVENOUS
  Administered 2017-04-14 (×4): 4 mg via INTRAVENOUS
  Administered 2017-04-15 – 2017-04-16 (×4): 2 mg via INTRAVENOUS
  Filled 2017-04-12: qty 1
  Filled 2017-04-12: qty 2
  Filled 2017-04-12: qty 1
  Filled 2017-04-12 (×4): qty 2
  Filled 2017-04-12: qty 1
  Filled 2017-04-12: qty 2
  Filled 2017-04-12: qty 1
  Filled 2017-04-12 (×2): qty 2
  Filled 2017-04-12: qty 1
  Filled 2017-04-12: qty 2
  Filled 2017-04-12 (×2): qty 1
  Filled 2017-04-12 (×6): qty 2

## 2017-04-12 NOTE — Progress Notes (Signed)
Patient ID: Nathan Shaw, male   DOB: 1955/10/30, 61 y.o.   MRN: [id number]  [facility] Progress Note  1 Day Post-Op  Subjective: CC- s/p ILEOCECECTOMY AND SMALL BOWEL RESECTION Sitting up in chair. States that he is very sore this morning, and a little nauseated. No flatus or BM. Plans to walk later this morning.  Objective: Vital signs in last 24 hours: Temp:  [98.2 F (36.8 C)-98.9 F (37.2 C)] 98.8 F (37.1 C) (07/13 0501) Pulse Rate:  [60-93] 66 (07/13 0501) Resp:  [12-17] 16 (07/13 0501) BP: (129-148)/(64-97) 134/73 (07/13 0501) SpO2:  [98 %-100 %] 99 % (07/13 0501) Last BM Date: 04/11/17 (PTA)  Intake/Output from previous day: 07/12 0701 - 07/13 0700 In: 3803.3 [I.V.:3703.3; IV Piggyback:100] Out: 2975 [Urine:2325; Emesis/NG output:200; Blood:450] Intake/Output this shift: No intake/output data recorded.  PE: Gen:  Alert, NAD, pleasant HEENT: EOM's intact, pupils equal  Card:  RRR, no M/G/R heard Pulm:  CTAB, no W/R/R, effort normal Abd: Soft, ND, appropriately tender, few BS heard, incisions C/D/I with honeycomb dressing in place Ext:  No erythema, edema, or tenderness BUE/BLE  Psych: A&Ox3  Skin: no rashes noted, warm and dry  Lab Results:   Recent Labs  04/11/17 0451 04/12/17 0459  WBC 7.1 16.5*  HGB 12.5* 11.4*  HCT 38.1* 34.8*  PLT 268 344   BMET  Recent Labs  04/11/17 0451 04/12/17 0459  NA 140 139  K 3.2* 3.5  CL 108 104  CO2 25 27  GLUCOSE 95 151*  BUN <5* <5*  CREATININE 0.85 0.83  CALCIUM 8.4* 7.9*   PT/INR No results for input(s): LABPROT, INR in the last 72 hours. CMP     Component Value Date/Time   NA 139 04/12/2017 0459   K 3.5 04/12/2017 0459   CL 104 04/12/2017 0459   CO2 27 04/12/2017 0459   GLUCOSE 151 (H) 04/12/2017 0459   BUN <5 (L) 04/12/2017 0459   CREATININE 0.83 04/12/2017 0459   CALCIUM 7.9 (L) 04/12/2017 0459   PROT 6.7 04/06/2017 1756   ALBUMIN 2.8 (L) 04/06/2017 1756   AST 24 04/06/2017 1756    ALT 24 04/06/2017 1756   ALKPHOS 118 04/06/2017 1756   BILITOT 0.5 04/06/2017 1756   GFRNONAA >60 04/12/2017 0459   GFRAA >60 04/12/2017 0459   Lipase     Component Value Date/Time   LIPASE 20 04/06/2017 1756       Studies/Results: Ct Abdomen Pelvis W Contrast  Result Date: 04/10/2017 CLINICAL DATA:  Abdominal cramping Crohn's disease EXAM: CT ABDOMEN AND PELVIS WITH CONTRAST TECHNIQUE: Multidetector CT imaging of the abdomen and pelvis was performed using the standard protocol following bolus administration of intravenous contrast. CONTRAST:  36mL ISOVUE-300 IOPAMIDOL (ISOVUE-300) INJECTION 61%, 175mL ISOVUE-300 IOPAMIDOL (ISOVUE-300) INJECTION 61% COMPARISON:  04/06/2017, 03/19/2017 FINDINGS: Lower chest: Lung bases demonstrate no acute consolidation or pleural effusion. Nonenlarged heart. Hepatobiliary: Hepatic steatosis. No calcified gallstones or biliary dilatation. subcentimeter hypodensity in the dome of the liver. Pancreas: Unremarkable. No pancreatic ductal dilatation or surrounding inflammatory changes. Spleen: Normal in size without focal abnormality. Adrenals/Urinary Tract: Adrenal glands are unremarkable. Kidneys are normal, without renal calculi, focal lesion, or hydronephrosis. Bladder is unremarkable. Stomach/Bowel: Stomach is nonenlarged. Prominent submucosal fat in the duodenum as noted previously, possibly related to prior bouts of inflammation. Long segment small bowel thickening of the distal ileum in the right lower quadrant and upper pelvis consistent with ileitis with moderate residual surrounding inflammation in the mesenteries he no overall  decreased compared to the most recent prior CT. 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 This is contiguous with gas and fluid collection in the right lower quadrant mesentery measuring 3.9 x 1.9 cm consistent with abscess, and decreased compared to previous  measurements of 4.9 x 4.7 by 3.4 cm. Contrast migrates beyond the thickens loop of ileum and there is contrast present within the colon. Additional thickened mid ileal bowel loop, series 2, image number 30. Vascular/Lymphatic: Multiple right lower quadrant lymph nodes. Atherosclerotic vascular disease of the aorta. Reproductive: Prostate is unremarkable. Other: No free air. Small free fluid in the pelvis and lower abdomen. Musculoskeletal: No acute or suspicious bone lesion. Stable wedging of L2. IMPRESSION: 1. Findings consistent with distal ileitis with overall decreased inflammation surrounding the thickened small bowel loops in the right lower quadrant. 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 Decreasing inflammation in the right lower quadrant mesentery compared to prior. 2. Small amount of ascites in the abdomen and pelvis, increased compared to prior Electronically Signed   By: Donavan Foil M.D.   On: 04/10/2017 22:31    Anti-infectives: Anti-infectives    Start     Dose/Rate Route Frequency Ordered Stop   04/11/17 1400  cefTRIAXone (ROCEPHIN) 2 g in dextrose 5 % 50 mL IVPB     2 g 100 mL/hr over 30 Minutes Intravenous Every 24 hours 04/11/17 1050     04/11/17 1400  metroNIDAZOLE (FLAGYL) IVPB 500 mg     500 mg 100 mL/hr over 60 Minutes Intravenous Every 8 hours 04/11/17 1050     04/11/17 1215  piperacillin-tazobactam (ZOSYN) IVPB 3.375 g     3.375 g 100 mL/hr over 30 Minutes Intravenous  Once 04/11/17 1205 04/11/17 1301   04/11/17 1155  piperacillin-tazobactam (ZOSYN) 3.375 GM/50ML IVPB    Comments:  Whitlow, Cheryl   : cabinet override      04/11/17 1155 04/11/17 1201   04/07/17 1000  valACYclovir (VALTREX) tablet 500 mg     500 mg Oral Daily 04/06/17 2333     04/07/17 0600  piperacillin-tazobactam (ZOSYN) IVPB 3.375 g  Status:  Discontinued     3.375 g 12.5 mL/hr over 240  Minutes Intravenous Every 8 hours 04/06/17 2353 04/11/17 1050   04/06/17 2115  piperacillin-tazobactam (ZOSYN) IVPB 3.375 g     3.375 g 100 mL/hr over 30 Minutes Intravenous  Once 04/06/17 2110 04/06/17 2220       Assessment/Plan Terminal ileitis due to crohn's disease, small bowel stricture S/p LAPAROSCOPIC ILEOCECECTOMY AND SMALL BOWEL RESECTION 7/12 - POD1 - path pending - NG tube with low output - awaiting bowel function  ID - rocephin/flagyl 7/12>>, 7/7>>7/12 FEN - IVF, NPO/NGT VTE- SCDs, lovenox  Plan - d/c foley. Continue NPO/NGT until return in bowel function. Encourage ambulation/IS today. Add IV robaxin and tylenol for better pain control. Labs in AM.   LOS: 6 days    Jerrye Beavers , [facility] 04/12/2017, 9:47 AM Pager: [phone] Consults: [phone] Mon-Fri 7:00 am-4:30 pm Sat-Sun 7:00 am-11:30 am

## 2017-04-12 NOTE — Discharge Instructions (Signed)
[facility], [place] [phone]  OPEN ABDOMINAL SURGERY: POST OP INSTRUCTIONS  Always review your discharge instruction sheet given to you by the facility where your surgery was performed.  IF YOU HAVE DISABILITY OR FAMILY LEAVE FORMS, YOU MUST BRING THEM TO THE OFFICE FOR PROCESSING.  PLEASE DO NOT GIVE THEM TO YOUR DOCTOR.  1. A prescription for pain medication may be given to you upon discharge.  Take your pain medication as prescribed, if needed.  If narcotic pain medicine is not needed, then you may take acetaminophen (Tylenol) or ibuprofen (Advil) as needed. 2. Take your usually prescribed medications unless otherwise directed. 3. If you need a refill on your pain medication, please contact your pharmacy. They will contact our office to request authorization.  Prescriptions will not be filled after 5pm or on week-ends. 4. You should follow a light diet the first few days after arrival home, such as soup and crackers, pudding, etc.unless your doctor has advised otherwise. A high-fiber, low fat diet can be resumed as tolerated.   Be sure to include lots of fluids daily. Most patients will experience some swelling and bruising on the chest and neck area.  Ice packs will help.  Swelling and bruising can take several days to resolve 5. Most patients will experience some swelling and bruising in the area of the incision. Ice pack will help. Swelling and bruising can take several days to resolve..  6. It is common to experience some constipation if taking pain medication after surgery.  Increasing fluid intake and taking a stool softener will usually help or prevent this problem from occurring.  A mild laxative (Milk of Magnesia or Miralax) should be taken according to package directions if there are no bowel movements after 48 hours. 7.  The glue over your incisions will flake off over the next 2-3 weeks.  You may find that a light gauze bandage over your incision may keep your  wound from being rubbed or pulled. You may shower and replace the bandage daily. 8. ACTIVITIES:  You may resume regular (light) daily activities beginning the next day--such as daily self-care, walking, climbing stairs--gradually increasing activities as tolerated.  You may have sexual intercourse when it is comfortable.  Refrain from any heavy lifting or straining until approved by your doctor. a. You may drive when you no longer are taking prescription pain medication, you can comfortably wear a seatbelt, and you can safely maneuver your car and apply brakes b. Return to Work: ___________________________________ 11. You should see your doctor in the office for a follow-up appointment approximately two weeks after your surgery.  Make sure that you call for this appointment within a day or two after you arrive home to insure a convenient appointment time. OTHER INSTRUCTIONS:  _____________________________________________________________ _____________________________________________________________  WHEN TO CALL YOUR DOCTOR: 1. Fever over 101.0 2. Inability to urinate 3. Nausea and/or vomiting 4. Extreme swelling or bruising 5. Continued bleeding from incision. 6. Increased pain, redness, or drainage from the incision. 7. Difficulty swallowing or breathing 8. Muscle cramping or spasms. 9. Numbness or tingling in hands or feet or around lips.  The clinic staff is available to answer your questions during regular business hours.  Please dont hesitate to call and ask to speak to one of the nurses if you have concerns.  For further questions, please visit [email or web address]

## 2017-04-12 NOTE — Progress Notes (Signed)
PROGRESS NOTE    Nathan Shaw  NOB:[id number] DOB: 02/11/1956 DOA: 04/06/2017 PCP: Hulan Fess, MD    Brief Narrative: 61 year old male who presented with abdominal cramping. Patient is known to have Crohn's disease, GERD, hypogonadism. About 4 weeks ago he had a episode of Crohn's flare, treated with 6-MP, with improvement of his symptoms. The day of admission he had acute recurrence of abdominal pain, colicky in nature, severe in intensity and worsening, localized in the right lower quadrant, then he presented to the hospital due to worsening symptoms. He was recently treated for an episode of prostatitis with ciprofloxacin. On initial physical examination blood pressure 136/89, heart rate 85, respiratory rate 16, temperature 98.2, oxygen saturation 100%, mucous membranes were moist, lungs were clear to auscultation bilaterally, heart S1-S2 present rhythmic, the abdomen was distended, had hypoactive bowel sounds, tender to palpation in the upper quadrants and more pronounced on the right lower quadrant, positive guarding at that site. Sodium 139, potassium 3.7, chloride 105, bicarbonate 25, glucose 121, BUN 8, creatinine 0.87, AST 24, ALT 24, white count 11.9, hemoglobin 13.6, hematocrit 40.7, platelets 360, urinalysis negative for infection. 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   Patient was admitted to medical floor with working diagnosis of acute Crohn's flare complicated by mesenteric abscess.   Assessment & Plan:   Active Problems:   GERD (gastroesophageal reflux disease)   Crohn's ileitis, with abscess ([address])   1. Crohn's flare with mesenteric abscess, with viridians strp. bacteremia  Continue antibiotic therapy with ceftriaxone and metronidazole. Patient sp surgical intervention with LAPAROSCOPIC ILEOCECECTOMY AND SMALL BOWEL RESECTION. Will continue  NG tube to low  intermittent suction per surgery recommendations. Continue analgesics and antiemetics. IV fluids for hydration. Foley has been removed, patient ambulated, waiting to return bowel function.   2. History of alcohol abuse.  No signs of withdrawal, will continue neuro checks per unit protocol, no anxiety.   3. Hypogonadism.  On topical testosterone, with good toleration.   4. History of herpes. Continue valacyclovir,   DVT prophylaxis:enoxaparin  Code Status:full  Family Communication: Disposition Plan:home    Consultants:  Surgery  Gastroenterology   Procedures:   Antimicrobials:   Ceftriaxone  Metronidazole  Subjective: Patient with abdominal pain, improved with analgesics, no nausea or vomiting, no dyspnea or chest pain.   Objective: Vitals:   04/11/17 2019 04/12/17 0052 04/12/17 0501 04/12/17 1309  BP: 139/80 140/85 134/73   Pulse: 79 84 66   Resp: 14 15 16    Temp: 98.7 F (37.1 C) 98.9 F (37.2 C) 98.8 F (37.1 C)   TempSrc: Oral Oral Oral   SpO2: 98% 98% 99% 97%  Weight:      Height:        Intake/Output Summary (Last 24 hours) at 04/12/17 1405 Last data filed at 04/12/17 1300  Gross per 24 hour  Intake          3453.33 ml  Output             2675 ml  Net           778.33 ml   Filed Weights   04/07/17 0115  Weight: 83.6 kg (184 lb 4.9 oz)    Examination:  General exam: deconditioned E ENT. Mild pallor no icterus, oral mucosa moist.  Respiratory system: Decreased breath sounds at bases, no wheezing, rales or rhonchi. Respiratory effort normal. Cardiovascular system: S1 &  S2 heard, RRR. No JVD, murmurs, rubs, gallops or clicks. No pedal edema. Gastrointestinal system: Abdomen is nondistended, soft. No organomegaly or masses felt. Normal bowel sounds heard. Tender to deep palpation, ostomy bag in place with clean wound.  Central nervous system: Alert and oriented. No focal neurological deficits. Extremities: Symmetric 5 x 5  power. Skin: No rashes, lesions or ulcers     Data Reviewed: I have personally reviewed following labs and imaging studies  CBC:  Recent Labs Lab 04/06/17 1757 04/07/17 0100 04/08/17 0503 04/10/17 0533 04/11/17 0451 04/12/17 0459  WBC 11.9* 11.0* 12.3* 6.0 7.1 16.5*  NEUTROABS 8.9*  --  9.0* 3.5 4.5  --   HGB 13.6 13.2 14.9 12.7* 12.5* 11.4*  HCT 40.7 39.9 43.9 39.0 38.1* 34.8*  MCV 83.6 83.6 83.0 83.7 83.7 82.1  PLT 362 367 331 245 268 166   Basic Metabolic Panel:  Recent Labs Lab 04/07/17 0100 04/08/17 0503 04/10/17 0533 04/11/17 0451 04/12/17 0459  NA 140 135 142 140 139  K 3.7 4.2 3.6 3.2* 3.5  CL 104 105 107 108 104  CO2 26 23 25 25 27   GLUCOSE 91 143* 97 95 151*  BUN 6 6 <5* <5* <5*  CREATININE 0.93 0.87 0.89 0.85 0.83  CALCIUM 8.8* 8.4* 8.3* 8.4* 7.9*   GFR: Estimated Creatinine Clearance: 103.3 mL/min (by C-G formula based on SCr of 0.83 mg/dL). Liver Function Tests:  Recent Labs Lab 04/06/17 1756  AST 24  ALT 24  ALKPHOS 118  BILITOT 0.5  PROT 6.7  ALBUMIN 2.8*    Recent Labs Lab 04/06/17 1756  LIPASE 20   No results for input(s): AMMONIA in the last 168 hours. Coagulation Profile: No results for input(s): INR, PROTIME in the last 168 hours. Cardiac Enzymes: No results for input(s): CKTOTAL, CKMB, CKMBINDEX, TROPONINI in the last 168 hours. BNP (last 3 results) No results for input(s): PROBNP in the last 8760 hours. HbA1C: No results for input(s): HGBA1C in the last 72 hours. CBG:  Recent Labs Lab 04/08/17 0721 04/09/17 0728 04/10/17 0709 04/11/17 0711 04/12/17 0738  GLUCAP 133* 104* 96 90 125*   Lipid Profile: No results for input(s): CHOL, HDL, LDLCALC, TRIG, CHOLHDL, LDLDIRECT in the last 72 hours. Thyroid Function Tests: No results for input(s): TSH, T4TOTAL, FREET4, T3FREE, THYROIDAB in the last 72 hours. Anemia Panel: No results for input(s): VITAMINB12, FOLATE, FERRITIN, TIBC, IRON, RETICCTPCT in the last 72  hours. Sepsis Labs: No results for input(s): PROCALCITON, LATICACIDVEN in the last 168 hours.  Recent Results (from the past 240 hour(s))  Culture, blood (routine x 2)     Status: Abnormal   Collection Time: 04/07/17  1:00 AM  Result Value Ref Range Status   Specimen Description BLOOD BLOOD LEFT HAND  Final   Special Requests   Final    BOTTLES DRAWN AEROBIC ONLY Blood Culture adequate volume   Culture  Setup Time   Final    GRAM POSITIVE COCCI IN CHAINS AEROBIC BOTTLE ONLY CRITICAL RESULT CALLED TO, READ BACK BY AND VERIFIED WITH: M LILLISTON,PHARMD AT 1023 04/08/17 BY L BENFIELD    Culture (A)  Final    STREPTOCOCCUS PARASANGUINIS THE SIGNIFICANCE OF ISOLATING THIS ORGANISM FROM A SINGLE SET OF BLOOD CULTURES WHEN MULTIPLE SETS ARE DRAWN IS UNCERTAIN. PLEASE NOTIFY THE MICROBIOLOGY DEPARTMENT WITHIN ONE WEEK IF SPECIATION AND SENSITIVITIES ARE REQUIRED. Performed at [facility], [place] [address] [place], [place] [address]    Report Status 04/10/2017 FINAL  Final  Culture, blood (routine x 2)     Status: Abnormal   Collection Time: 04/07/17  1:00 AM  Result Value Ref Range Status   Specimen Description BLOOD RIGHT ANTECUBITAL  Final   Special Requests   Final    BOTTLES DRAWN AEROBIC ONLY Blood Culture adequate volume   Culture  Setup Time   Final    GRAM POSITIVE COCCI IN CHAINS AEROBIC BOTTLE ONLY CRITICAL RESULT CALLED TO, READ BACK BY AND VERIFIED WITH: M LILLISTON,PHARMD AT 1023 04/08/17 BY L BENFIELD    Culture (A)  Final    STREPTOCOCCUS CRISTATUS THE SIGNIFICANCE OF ISOLATING THIS ORGANISM FROM A SINGLE SET OF BLOOD CULTURES WHEN MULTIPLE SETS ARE DRAWN IS UNCERTAIN. PLEASE NOTIFY THE MICROBIOLOGY DEPARTMENT WITHIN ONE WEEK IF SPECIATION AND SENSITIVITIES ARE REQUIRED. Performed at [facility], [place] [address] [place], [place] [address]    Report Status 04/10/2017 FINAL  Final  Blood Culture ID Panel (Reflexed)     Status: Abnormal   Collection Time: 04/07/17   1:00 AM  Result Value Ref Range Status   Enterococcus species NOT DETECTED NOT DETECTED Final   Listeria monocytogenes NOT DETECTED NOT DETECTED Final   Staphylococcus species NOT DETECTED NOT DETECTED Final   Staphylococcus aureus NOT DETECTED NOT DETECTED Final   Streptococcus species DETECTED (A) NOT DETECTED Final    Comment: Not Enterococcus species, Streptococcus agalactiae, Streptococcus pyogenes, or Streptococcus pneumoniae. CRITICAL RESULT CALLED TO, READ BACK BY AND VERIFIED WITH: M LILLISTON,PHARMD AT 1023 04/08/17 BY L BENFIELD    Streptococcus agalactiae NOT DETECTED NOT DETECTED Final   Streptococcus pneumoniae NOT DETECTED NOT DETECTED Final   Streptococcus pyogenes NOT DETECTED NOT DETECTED Final   Acinetobacter baumannii NOT DETECTED NOT DETECTED Final   Enterobacteriaceae species NOT DETECTED NOT DETECTED Final   Enterobacter cloacae complex NOT DETECTED NOT DETECTED Final   Escherichia coli NOT DETECTED NOT DETECTED Final   Klebsiella oxytoca NOT DETECTED NOT DETECTED Final   Klebsiella pneumoniae NOT DETECTED NOT DETECTED Final   Proteus species NOT DETECTED NOT DETECTED Final   Serratia marcescens NOT DETECTED NOT DETECTED Final   Haemophilus influenzae NOT DETECTED NOT DETECTED Final   Neisseria meningitidis NOT DETECTED NOT DETECTED Final   Pseudomonas aeruginosa NOT DETECTED NOT DETECTED Final   Candida albicans NOT DETECTED NOT DETECTED Final   Candida glabrata NOT DETECTED NOT DETECTED Final   Candida krusei NOT DETECTED NOT DETECTED Final   Candida parapsilosis NOT DETECTED NOT DETECTED Final   Candida tropicalis NOT DETECTED NOT DETECTED Final    Comment: Performed at [facility], [place]. [address], [place], [place] [address]  Culture, blood (Routine X 2) w Reflex to ID Panel     Status: None (Preliminary result)   Collection Time: 04/09/17  3:22 PM  Result Value Ref Range Status   Specimen Description BLOOD RIGHT HAND  Final   Special Requests    Final    BOTTLES DRAWN AEROBIC AND ANAEROBIC Blood Culture adequate volume   Culture   Final    NO GROWTH 3 DAYS Performed at [facility], [place] [address], [place],  [address]    Report Status PENDING  Incomplete  Culture, blood (Routine X 2) w Reflex to ID Panel     Status: None (Preliminary result)   Collection Time: 04/09/17  5:33 PM  Result Value Ref Range Status   Specimen Description BLOOD LEFT HAND  Final   Special Requests   Final  BOTTLES DRAWN AEROBIC ONLY Blood Culture adequate volume   Culture   Final    NO GROWTH 3 DAYS Performed at [facility], [place] [address], [place], [place] [address]    Report Status PENDING  Incomplete  Surgical pcr screen     Status: None   Collection Time: 04/11/17 10:06 AM  Result Value Ref Range Status   MRSA, PCR NEGATIVE NEGATIVE Final   Staphylococcus aureus NEGATIVE NEGATIVE Final    Comment:        The Xpert SA Assay (FDA approved for NASAL specimens in patients over 56 years of age), is one component of a comprehensive surveillance program.  Test performance has been validated by [facility] for patients greater than or equal to 63 year old. It is not intended to diagnose infection nor to guide or monitor treatment.          Radiology Studies: Ct Abdomen Pelvis W Contrast  Result Date: 04/10/2017 CLINICAL DATA:  Abdominal cramping Crohn's disease EXAM: CT ABDOMEN AND PELVIS WITH CONTRAST TECHNIQUE: Multidetector CT imaging of the abdomen and pelvis was performed using the standard protocol following bolus administration of intravenous contrast. CONTRAST:  88mL ISOVUE-300 IOPAMIDOL (ISOVUE-300) INJECTION 61%, 154mL ISOVUE-300 IOPAMIDOL (ISOVUE-300) INJECTION 61% COMPARISON:  04/06/2017, 03/19/2017 FINDINGS: Lower chest: Lung bases demonstrate no acute consolidation or pleural effusion. Nonenlarged heart. Hepatobiliary: Hepatic steatosis. No calcified gallstones or biliary dilatation. subcentimeter  hypodensity in the dome of the liver. Pancreas: Unremarkable. No pancreatic ductal dilatation or surrounding inflammatory changes. Spleen: Normal in size without focal abnormality. Adrenals/Urinary Tract: Adrenal glands are unremarkable. Kidneys are normal, without renal calculi, focal lesion, or hydronephrosis. Bladder is unremarkable. Stomach/Bowel: Stomach is nonenlarged. Prominent submucosal fat in the duodenum as noted previously, possibly related to prior bouts of inflammation. Long segment small bowel thickening of the distal ileum in the right lower quadrant and upper pelvis consistent with ileitis with moderate residual surrounding inflammation in the mesenteries he no overall decreased compared to the most recent prior CT. 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 This is contiguous with gas and fluid collection in the right lower quadrant mesentery measuring 3.9 x 1.9 cm consistent with abscess, and decreased compared to previous measurements of 4.9 x 4.7 by 3.4 cm. Contrast migrates beyond the thickens loop of ileum and there is contrast present within the colon. Additional thickened mid ileal bowel loop, series 2, image number 30. Vascular/Lymphatic: Multiple right lower quadrant lymph nodes. Atherosclerotic vascular disease of the aorta. Reproductive: Prostate is unremarkable. Other: No free air. Small free fluid in the pelvis and lower abdomen. Musculoskeletal: No acute or suspicious bone lesion. Stable wedging of L2. IMPRESSION: 1. Findings consistent with distal ileitis with overall decreased inflammation surrounding the thickened small bowel loops in the right lower quadrant. 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 Decreasing inflammation in the right lower quadrant mesentery compared to prior. 2. Small amount of  ascites in the abdomen and pelvis, increased compared to prior Electronically Signed   By: Donavan Foil M.D.   On: 04/10/2017 22:31        Scheduled Meds: . azaTHIOprine  100 mg Oral Daily  . cholecalciferol  2,000 Units Oral Daily  . enoxaparin (LOVENOX) injection  40 mg Subcutaneous QHS  . loratadine  10 mg Oral Daily  . mouth  rinse  15 mL Mouth Rinse q12n4p  . omega-3 acid ethyl esters  1,000 mg Oral Daily  . testosterone  5 g Transdermal Daily   Continuous Infusions: . acetaminophen Stopped (04/12/17 1245)  . acyclovir 825 mg (04/12/17 1250)  . cefTRIAXone (ROCEPHIN)  IV    . dextrose 5 % and 0.9% NaCl 100 mL/hr at 04/11/17 1758  . famotidine (PEPCID) IV Stopped (04/12/17 1105)  . methocarbamol (ROBAXIN)  IV    . metronidazole Stopped (04/12/17 1229)     LOS: 6 days     Mauricio Gerome Apley, MD [facility] Pager [phone]  If 7PM-7AM, please contact night-coverage [email or web address] Password [email or web address] 04/12/2017, 2:05 PM

## 2017-04-13 ENCOUNTER — Encounter (HOSPITAL_COMMUNITY): Payer: Self-pay | Admitting: Surgery

## 2017-04-13 DIAGNOSIS — K50114 Crohn's disease of large intestine with abscess: Secondary | ICD-10-CM

## 2017-04-13 DIAGNOSIS — K5090 Crohn's disease, unspecified, without complications: Secondary | ICD-10-CM | POA: Diagnosis present

## 2017-04-13 LAB — CREATININE, SERUM
Creatinine, Ser: 0.76 mg/dL (ref 0.61–1.24)
GFR calc Af Amer: >60 mL/min (ref >60)
GFR calc non Af Amer: >60 mL/min (ref >60)

## 2017-04-13 LAB — GLUCOSE, CAPILLARY: Glucose-Capillary: 144 mg/dL — ABNORMAL HIGH (ref 65–99)

## 2017-04-13 MED ORDER — PHENOL 1.4 % MT LIQD
1.0000 | OROMUCOSAL | Status: DC | PRN
  Filled 2017-04-13: qty 177

## 2017-04-13 NOTE — Progress Notes (Signed)
[facility]:  [phone] General Surgery Progress Note   LOS: 7 days  POD -  2 Days Post-Op  Chief Complaint: Abdominal pain  Assessment and Plan: 1.  LAPAROSCOPIC ILEOCECECTOMY AND SMALL BOWEL RESECTION - 04/11/2017 - Nathan Shaw   For Crohn's disease of TI/stricture  Rocephin/Flagyl  Has NGT - still has ileus.  2.  Crohn's disease  On Imuran 3.  DVT prophylaxis - Lovenox   Active Problems:   GERD (gastroesophageal reflux disease)   Crohn's ileitis, with abscess ([facility])  Subjective:  Sore.  No flatus or BM.  NGT bothers him.  Wife and mother in room.  Objective:   Vitals:   04/12/17 2202 04/13/17 0537  BP: 123/70 133/78  Pulse: 67 93  Resp: 16 16  Temp: 99.1 F (37.3 C) 99.5 F (37.5 C)     Intake/Output from previous day:  07/13 0701 - 07/14 0700 In: 7189.7 [I.V.:5768.2; IV Piggyback:1421.5] Out: 2225 [Urine:1675; Emesis/NG output:550]  Intake/Output this shift:  No intake/output data recorded.   Physical Exam:   General: WN WM who is alert and oriented.    HEENT: Normal. Pupils equal. .   Lungs: clear   Abdomen: Mild distention.  Quiet.   Wound: Clean.   Lab Results:    Recent Labs  04/11/17 0451 04/12/17 0459  WBC 7.1 16.5*  HGB 12.5* 11.4*  HCT 38.1* 34.8*  PLT 268 344    BMET   Recent Labs  04/11/17 0451 04/12/17 0459 04/13/17 0514  NA 140 139  --   K 3.2* 3.5  --   CL 108 104  --   CO2 25 27  --   GLUCOSE 95 151*  --   BUN <5* <5*  --   CREATININE 0.85 0.83 0.76  CALCIUM 8.4* 7.9*  --     PT/INR  No results for input(s): LABPROT, INR in the last 72 hours.  ABG  No results for input(s): PHART, HCO3 in the last 72 hours.  Invalid input(s): PCO2, PO2   Studies/Results:  No results found.   Anti-infectives:   Anti-infectives    Start     Dose/Rate Route Frequency Ordered Stop   04/12/17 1200  acyclovir (ZOVIRAX) 825 mg in dextrose 5 % 150 mL IVPB     825 mg 166.5 mL/hr over 60 Minutes Intravenous Every 8  hours 04/12/17 0953     04/11/17 1400  cefTRIAXone (ROCEPHIN) 2 g in dextrose 5 % 50 mL IVPB     2 g 100 mL/hr over 30 Minutes Intravenous Every 24 hours 04/11/17 1050     04/11/17 1400  metroNIDAZOLE (FLAGYL) IVPB 500 mg     500 mg 100 mL/hr over 60 Minutes Intravenous Every 8 hours 04/11/17 1050     04/11/17 1215  piperacillin-tazobactam (ZOSYN) IVPB 3.375 g     3.375 g 100 mL/hr over 30 Minutes Intravenous  Once 04/11/17 1205 04/11/17 1301   04/11/17 1155  piperacillin-tazobactam (ZOSYN) 3.375 GM/50ML IVPB    Comments:  Whitlow, Cheryl   : cabinet override      04/11/17 1155 04/11/17 1201   04/07/17 1000  valACYclovir (VALTREX) tablet 500 mg  Status:  Discontinued     500 mg Oral Daily 04/06/17 2333 04/12/17 1002   04/07/17 0600  piperacillin-tazobactam (ZOSYN) IVPB 3.375 g  Status:  Discontinued     3.375 g 12.5 mL/hr over 240 Minutes Intravenous Every 8 hours 04/06/17 2353 04/11/17 1050   04/06/17 2115  piperacillin-tazobactam (ZOSYN) IVPB 3.375 g  3.375 g 100 mL/hr over 30 Minutes Intravenous  Once 04/06/17 2110 04/06/17 2220      Alphonsa Overall, MD, FACS Pager: [phone] Surgery Office: [phone] 04/13/2017

## 2017-04-13 NOTE — Progress Notes (Signed)
PROGRESS NOTE    Nathan Shaw  JKK:[id number] DOB: 08-29-1956 DOA: 04/06/2017 PCP: Hulan Fess, MD    Brief Narrative:  61 year old male who presented with abdominal cramping. Patient is known to have Crohn's disease, GERD, hypogonadism. About 4 weeks ago he had a episode of Crohn's flare, treated with 6-MP, with improvement of his symptoms. The day of admission he had acute recurrence of abdominal pain, colicky in nature, severe in intensity and worsening, localized in the right lower quadrant, then he presented to the hospital due to worsening symptoms. He was recently treated for an episode of prostatitis with ciprofloxacin. On initial physical examination blood pressure 136/89, heart rate 85, respiratory rate 16, temperature 98.2, oxygen saturation 100%, mucous membranes were moist, lungs were clear to auscultation bilaterally, heart S1-S2 present rhythmic, the abdomen was distended, had hypoactive bowel sounds, tender to palpation in the upper quadrants and more pronounced on the right lower quadrant, positive guarding at that site. Sodium 139, potassium 3.7, chloride 105, bicarbonate 25, glucose 121, BUN 8, creatinine 0.87, AST 24, ALT 24, white count 11.9, hemoglobin 13.6, hematocrit 40.7, platelets 360, urinalysis negative for infection. 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   Patient was admitted to medical floor with working diagnosis of acute Crohn's flare complicated by mesenteric abscess. Patient was placed on broad spectrum antibiotic therapy, surgery consulted. Patient underwent laparoscopic ileocecectomy and small bowel resection. Has developed post-operative ileus.    Assessment & Plan:   Principal Problem:   Crohn's ileitis, with sticture & abscess s/p ileocectomy 04/11/2017 Active Problems:   GERD (gastroesophageal reflux disease)   Crohn disease ([place])    1.  Crohn's flare with mesenteric abscess, with viridians strp. bacteremia Antibiotic therapy with IV ceftriaxone, will dc metronidazole. Follow cultures no growth. SPLAPAROSCOPIC ILEOCECECTOMY AND SMALL BOWEL RESECTION with post-operative ileus. Continue  NG tube to low intermittent suction, output 550 ml. Continue IV fluids  with isotonic saline with dextrose. Will follow on cell count and electrolytes in am. Continue on Imuran. Continue famotidine and as needed zofran.   2. History of alcohol abuse.  Neuro checks per unit protocol.   3. Hypogonadism. testosterone, topical.   4. History of herpes. On valacyclovir,   DVT prophylaxis:enoxaparin  Code Status:full  Family Communication: Disposition Plan:home    Consultants:  Surgery  Gastroenterology   Procedures:   Antimicrobials:   Ceftriaxone  Metronidazole    Subjective: Patient has been ambulating, still no flatus or stools, abdominal pain is controlled, no nausea or vomiting, NG tube in place.   Objective: Vitals:   04/12/17 1309 04/12/17 1409 04/12/17 2202 04/13/17 0537  BP:  (!) 117/57 123/70 133/78  Pulse:  96 67 93  Resp:  16 16 16   Temp:  98.2 F (36.8 C) 99.1 F (37.3 C) 99.5 F (37.5 C)  TempSrc:  Oral Oral Oral  SpO2: 97% 97% 98% 96%  Weight:      Height:        Intake/Output Summary (Last 24 hours) at 04/13/17 1051 Last data filed at 04/13/17 0537  Gross per 24 hour  Intake           6789.7 ml  Output             1950 ml  Net           4839.7 ml   Filed Weights   04/07/17 0115  Weight: 83.6 kg (  184 lb 4.9 oz)    Examination:  General exam: deconditioned E ENT; no pallor or icterus, ng tube in place.   Respiratory system: Clear to auscultation. Respiratory effort normal.No rales, rhonchi or wheezing Cardiovascular system: S1 & S2 heard, RRR. No JVD, murmurs, rubs, gallops or clicks. No pedal edema. Gastrointestinal system: Abdomen is mild distended, soft and nontender. No  organomegaly or masses felt. decreased bowel sounds heard. Central nervous system: Alert and oriented. No focal neurological deficits. Extremities: Symmetric 5 x 5 power. Skin: No rashes, lesions or ulcers     Data Reviewed: I have personally reviewed following labs and imaging studies  CBC:  Recent Labs Lab 04/06/17 1757 04/07/17 0100 04/08/17 0503 04/10/17 0533 04/11/17 0451 04/12/17 0459  WBC 11.9* 11.0* 12.3* 6.0 7.1 16.5*  NEUTROABS 8.9*  --  9.0* 3.5 4.5  --   HGB 13.6 13.2 14.9 12.7* 12.5* 11.4*  HCT 40.7 39.9 43.9 39.0 38.1* 34.8*  MCV 83.6 83.6 83.0 83.7 83.7 82.1  PLT 362 367 331 245 268 144   Basic Metabolic Panel:  Recent Labs Lab 04/07/17 0100 04/08/17 0503 04/10/17 0533 04/11/17 0451 04/12/17 0459 04/13/17 0514  NA 140 135 142 140 139  --   K 3.7 4.2 3.6 3.2* 3.5  --   CL 104 105 107 108 104  --   CO2 26 23 25 25 27   --   GLUCOSE 91 143* 97 95 151*  --   BUN 6 6 <5* <5* <5*  --   CREATININE 0.93 0.87 0.89 0.85 0.83 0.76  CALCIUM 8.8* 8.4* 8.3* 8.4* 7.9*  --    GFR: Estimated Creatinine Clearance: 107.2 mL/min (by C-G formula based on SCr of 0.76 mg/dL). Liver Function Tests:  Recent Labs Lab 04/06/17 1756  AST 24  ALT 24  ALKPHOS 118  BILITOT 0.5  PROT 6.7  ALBUMIN 2.8*    Recent Labs Lab 04/06/17 1756  LIPASE 20   No results for input(s): AMMONIA in the last 168 hours. Coagulation Profile: No results for input(s): INR, PROTIME in the last 168 hours. Cardiac Enzymes: No results for input(s): CKTOTAL, CKMB, CKMBINDEX, TROPONINI in the last 168 hours. BNP (last 3 results) No results for input(s): PROBNP in the last 8760 hours. HbA1C: No results for input(s): HGBA1C in the last 72 hours. CBG:  Recent Labs Lab 04/09/17 0728 04/10/17 0709 04/11/17 0711 04/12/17 0738 04/13/17 0729  GLUCAP 104* 96 90 125* 144*   Lipid Profile: No results for input(s): CHOL, HDL, LDLCALC, TRIG, CHOLHDL, LDLDIRECT in the last 72  hours. Thyroid Function Tests: No results for input(s): TSH, T4TOTAL, FREET4, T3FREE, THYROIDAB in the last 72 hours. Anemia Panel: No results for input(s): VITAMINB12, FOLATE, FERRITIN, TIBC, IRON, RETICCTPCT in the last 72 hours. Sepsis Labs: No results for input(s): PROCALCITON, LATICACIDVEN in the last 168 hours.  Recent Results (from the past 240 hour(s))  Culture, blood (routine x 2)     Status: Abnormal   Collection Time: 04/07/17  1:00 AM  Result Value Ref Range Status   Specimen Description BLOOD BLOOD LEFT HAND  Final   Special Requests   Final    BOTTLES DRAWN AEROBIC ONLY Blood Culture adequate volume   Culture  Setup Time   Final    GRAM POSITIVE COCCI IN CHAINS AEROBIC BOTTLE ONLY CRITICAL RESULT CALLED TO, READ BACK BY AND VERIFIED WITH: M LILLISTON,PHARMD AT 1023 04/08/17 BY L BENFIELD    Culture (A)  Final    STREPTOCOCCUS PARASANGUINIS THE  SIGNIFICANCE OF ISOLATING THIS ORGANISM FROM A SINGLE SET OF BLOOD CULTURES WHEN MULTIPLE SETS ARE DRAWN IS UNCERTAIN. PLEASE NOTIFY THE MICROBIOLOGY DEPARTMENT WITHIN ONE WEEK IF SPECIATION AND SENSITIVITIES ARE REQUIRED. Performed at [facility], [place] [address] [place], [place] [address]    Report Status 04/10/2017 FINAL  Final  Culture, blood (routine x 2)     Status: Abnormal   Collection Time: 04/07/17  1:00 AM  Result Value Ref Range Status   Specimen Description BLOOD RIGHT ANTECUBITAL  Final   Special Requests   Final    BOTTLES DRAWN AEROBIC ONLY Blood Culture adequate volume   Culture  Setup Time   Final    GRAM POSITIVE COCCI IN CHAINS AEROBIC BOTTLE ONLY CRITICAL RESULT CALLED TO, READ BACK BY AND VERIFIED WITH: M LILLISTON,PHARMD AT 1023 04/08/17 BY L BENFIELD    Culture (A)  Final    STREPTOCOCCUS CRISTATUS THE SIGNIFICANCE OF ISOLATING THIS ORGANISM FROM A SINGLE SET OF BLOOD CULTURES WHEN MULTIPLE SETS ARE DRAWN IS UNCERTAIN. PLEASE NOTIFY THE MICROBIOLOGY DEPARTMENT WITHIN ONE WEEK IF SPECIATION AND  SENSITIVITIES ARE REQUIRED. Performed at [facility], [place] [address] [place], [place] [address]    Report Status 04/10/2017 FINAL  Final  Blood Culture ID Panel (Reflexed)     Status: Abnormal   Collection Time: 04/07/17  1:00 AM  Result Value Ref Range Status   Enterococcus species NOT DETECTED NOT DETECTED Final   Listeria monocytogenes NOT DETECTED NOT DETECTED Final   Staphylococcus species NOT DETECTED NOT DETECTED Final   Staphylococcus aureus NOT DETECTED NOT DETECTED Final   Streptococcus species DETECTED (A) NOT DETECTED Final    Comment: Not Enterococcus species, Streptococcus agalactiae, Streptococcus pyogenes, or Streptococcus pneumoniae. CRITICAL RESULT CALLED TO, READ BACK BY AND VERIFIED WITH: M LILLISTON,PHARMD AT 1023 04/08/17 BY L BENFIELD    Streptococcus agalactiae NOT DETECTED NOT DETECTED Final   Streptococcus pneumoniae NOT DETECTED NOT DETECTED Final   Streptococcus pyogenes NOT DETECTED NOT DETECTED Final   Acinetobacter baumannii NOT DETECTED NOT DETECTED Final   Enterobacteriaceae species NOT DETECTED NOT DETECTED Final   Enterobacter cloacae complex NOT DETECTED NOT DETECTED Final   Escherichia coli NOT DETECTED NOT DETECTED Final   Klebsiella oxytoca NOT DETECTED NOT DETECTED Final   Klebsiella pneumoniae NOT DETECTED NOT DETECTED Final   Proteus species NOT DETECTED NOT DETECTED Final   Serratia marcescens NOT DETECTED NOT DETECTED Final   Haemophilus influenzae NOT DETECTED NOT DETECTED Final   Neisseria meningitidis NOT DETECTED NOT DETECTED Final   Pseudomonas aeruginosa NOT DETECTED NOT DETECTED Final   Candida albicans NOT DETECTED NOT DETECTED Final   Candida glabrata NOT DETECTED NOT DETECTED Final   Candida krusei NOT DETECTED NOT DETECTED Final   Candida parapsilosis NOT DETECTED NOT DETECTED Final   Candida tropicalis NOT DETECTED NOT DETECTED Final    Comment: Performed at [facility], [place]. [address] [place], [place] [address]    Culture, blood (Routine X 2) w Reflex to ID Panel     Status: None (Preliminary result)   Collection Time: 04/09/17  3:22 PM  Result Value Ref Range Status   Specimen Description BLOOD RIGHT HAND  Final   Special Requests   Final    BOTTLES DRAWN AEROBIC AND ANAEROBIC Blood Culture adequate volume   Culture   Final    NO GROWTH 4 DAYS Performed at [facility], [place] [address] [place],  [address]  Report Status PENDING  Incomplete  Culture, blood (Routine X 2) w Reflex to ID Panel     Status: None (Preliminary result)   Collection Time: 04/09/17  5:33 PM  Result Value Ref Range Status   Specimen Description BLOOD LEFT HAND  Final   Special Requests   Final    BOTTLES DRAWN AEROBIC ONLY Blood Culture adequate volume   Culture   Final    NO GROWTH 4 DAYS Performed at [facility], [place] [address], [place], [place] [address]    Report Status PENDING  Incomplete  Surgical pcr screen     Status: None   Collection Time: 04/11/17 10:06 AM  Result Value Ref Range Status   MRSA, PCR NEGATIVE NEGATIVE Final   Staphylococcus aureus NEGATIVE NEGATIVE Final    Comment:        The Xpert SA Assay (FDA approved for NASAL specimens in patients over 26 years of age), is one component of a comprehensive surveillance program.  Test performance has been validated by [facility] for patients greater than or equal to 52 year old. It is not intended to diagnose infection nor to guide or monitor treatment.          Radiology Studies: No results found.      Scheduled Meds: . azaTHIOprine  100 mg Oral Daily  . cholecalciferol  2,000 Units Oral Daily  . enoxaparin (LOVENOX) injection  40 mg Subcutaneous QHS  . loratadine  10 mg Oral Daily  . mouth rinse  15 mL Mouth Rinse q12n4p  . omega-3 acid ethyl esters  1,000 mg Oral Daily  . testosterone  5 g Transdermal Daily   Continuous Infusions: . acyclovir 825 mg (04/13/17 0515)  . cefTRIAXone (ROCEPHIN)  IV  Stopped (04/12/17 1530)  . dextrose 5 % and 0.9% NaCl 100 mL/hr at 04/13/17 0957  . famotidine (PEPCID) IV Stopped (04/13/17 1026)  . methocarbamol (ROBAXIN)  IV Stopped (04/13/17 0724)  . metronidazole Stopped (04/13/17 2426)     LOS: 7 days       Raffael Bugarin Gerome Apley, MD [facility] Pager [phone]  If 7PM-7AM, please contact night-coverage [email or web address] Password [facility] 04/13/2017, 10:51 AM

## 2017-04-14 DIAGNOSIS — E876 Hypokalemia: Secondary | ICD-10-CM

## 2017-04-14 LAB — CBC WITH DIFFERENTIAL/PLATELET
Basophils Absolute: 0.1 10*3/uL (ref 0.0–0.1)
Basophils Relative: 0 %
Eosinophils Absolute: 0.7 10*3/uL (ref 0.0–0.7)
Eosinophils Relative: 6 %
HCT: 32.4 % — ABNORMAL LOW (ref 39.0–52.0)
Hemoglobin: 10.7 g/dL — ABNORMAL LOW (ref 13.0–17.0)
Lymphocytes Relative: 11 %
Lymphs Abs: 1.4 10*3/uL (ref 0.7–4.0)
MCH: 27.9 pg (ref 26.0–34.0)
MCHC: 33 g/dL (ref 30.0–36.0)
MCV: 84.4 fL (ref 78.0–100.0)
Monocytes Absolute: 0.8 10*3/uL (ref 0.1–1.0)
Monocytes Relative: 6 %
Neutro Abs: 10 10*3/uL — ABNORMAL HIGH (ref 1.7–7.7)
Neutrophils Relative %: 77 %
Platelets: 318 10*3/uL (ref 150–400)
RBC: 3.84 MIL/uL — ABNORMAL LOW (ref 4.22–5.81)
RDW: 13.9 % (ref 11.5–15.5)
WBC: 12.9 10*3/uL — ABNORMAL HIGH (ref 4.0–10.5)

## 2017-04-14 LAB — CULTURE, BLOOD (ROUTINE X 2)
Culture: NO GROWTH
Culture: NO GROWTH
Special Requests: ADEQUATE
Special Requests: ADEQUATE

## 2017-04-14 LAB — BASIC METABOLIC PANEL
Anion gap: 9 (ref 5–15)
BUN: <5 mg/dL — ABNORMAL LOW (ref 6–20)
CO2: 28 mmol/L (ref 22–32)
Calcium: 8.2 mg/dL — ABNORMAL LOW (ref 8.9–10.3)
Chloride: 104 mmol/L (ref 101–111)
Creatinine, Ser: 0.67 mg/dL (ref 0.61–1.24)
GFR calc Af Amer: >60 mL/min (ref >60)
GFR calc non Af Amer: >60 mL/min (ref >60)
Glucose, Bld: 128 mg/dL — ABNORMAL HIGH (ref 65–99)
Potassium: 3.1 mmol/L — ABNORMAL LOW (ref 3.5–5.1)
Sodium: 141 mmol/L (ref 135–145)

## 2017-04-14 LAB — MAGNESIUM: Magnesium: 1.8 mg/dL (ref 1.7–2.4)

## 2017-04-14 LAB — GLUCOSE, CAPILLARY: Glucose-Capillary: 106 mg/dL — ABNORMAL HIGH (ref 65–99)

## 2017-04-14 MED ORDER — METRONIDAZOLE IN NACL 5-0.79 MG/ML-% IV SOLN
500.0000 mg | Freq: Three times a day (TID) | INTRAVENOUS | Status: DC
  Administered 2017-04-14 – 2017-04-17 (×8): 500 mg via INTRAVENOUS
  Filled 2017-04-14 (×10): qty 100

## 2017-04-14 MED ORDER — BISACODYL 10 MG RE SUPP
10.0000 mg | Freq: Every day | RECTAL | Status: DC
  Administered 2017-04-14 – 2017-04-16 (×3): 10 mg via RECTAL
  Filled 2017-04-14 (×4): qty 1

## 2017-04-14 MED ORDER — LIP MEDEX EX OINT
1.0000 "application " | TOPICAL_OINTMENT | Freq: Two times a day (BID) | CUTANEOUS | Status: DC
  Administered 2017-04-14 – 2017-04-17 (×4): 1 via TOPICAL
  Filled 2017-04-14 (×3): qty 7

## 2017-04-14 MED ORDER — ACETAMINOPHEN 650 MG RE SUPP: 650.0000 mg | Freq: Four times a day (QID) | RECTAL | Status: DC | PRN

## 2017-04-14 MED ORDER — DIPHENHYDRAMINE HCL 50 MG/ML IJ SOLN: 12.5000 mg | Freq: Four times a day (QID) | INTRAMUSCULAR | Status: DC | PRN

## 2017-04-14 MED ORDER — LACTATED RINGERS IV BOLUS (SEPSIS): 1000.0000 mL | Freq: Three times a day (TID) | INTRAVENOUS | Status: AC | PRN

## 2017-04-14 MED ORDER — MAGIC MOUTHWASH
15.0000 mL | Freq: Four times a day (QID) | ORAL | Status: DC | PRN
  Administered 2017-04-15: 15 mL via ORAL
  Filled 2017-04-14: qty 15

## 2017-04-14 MED ORDER — MAGNESIUM SULFATE 2 GM/50ML IV SOLN
2.0000 g | Freq: Once | INTRAVENOUS | Status: AC
  Administered 2017-04-14: 2 g via INTRAVENOUS
  Filled 2017-04-14: qty 50

## 2017-04-14 MED ORDER — METOCLOPRAMIDE HCL 5 MG/ML IJ SOLN: 5.0000 mg | Freq: Four times a day (QID) | INTRAMUSCULAR | Status: DC | PRN

## 2017-04-14 MED ORDER — POTASSIUM CHLORIDE 10 MEQ/100ML IV SOLN
10.0000 meq | INTRAVENOUS | Status: AC
Start: 2017-04-14 — End: 2017-04-14
  Administered 2017-04-14 (×3): 10 meq via INTRAVENOUS
  Filled 2017-04-14 (×4): qty 100

## 2017-04-14 MED ORDER — POTASSIUM CHLORIDE 10 MEQ/100ML IV SOLN
10.0000 meq | INTRAVENOUS | Status: AC
  Administered 2017-04-14 (×4): 10 meq via INTRAVENOUS
  Filled 2017-04-14 (×4): qty 100

## 2017-04-14 MED ORDER — METHOCARBAMOL 1000 MG/10ML IJ SOLN
1000.0000 mg | Freq: Four times a day (QID) | INTRAMUSCULAR | Status: DC | PRN
  Filled 2017-04-14: qty 10

## 2017-04-14 MED ORDER — PROCHLORPERAZINE EDISYLATE 5 MG/ML IJ SOLN: 5.0000 mg | INTRAMUSCULAR | Status: DC | PRN

## 2017-04-14 NOTE — Progress Notes (Signed)
[place]  [address] [place], [place] [address] Phone: [phone]  FAX: [phone]      Nathan Shaw [id number] May 26, 1956  CARE TEAM:  PCP: Hulan Fess, MD  Outpatient Care Team: Patient Care Team: Hulan Fess, MD as PCP - General (Family Medicine) Garlan Fair, MD as Consulting Physician (Gastroenterology)  Inpatient Treatment Team: Treatment Team: Attending Provider: Tawni Millers, MD; Rounding Team: Fatima Blank, MD; Consulting Physician: Teena Irani, MD; Consulting Physician: Edison Pace, Md, MD; Technician: Sueanne Margarita, NT; Registered Nurse: Consuela Mimes, RN; Registered Nurse: Thermon Leyland, RN   Problem List:   Principal Problem:   Crohn's ileitis, with sticture & abscess s/p ileocectomy 04/11/2017 Active Problems:   GERD (gastroesophageal reflux disease)   Crohn disease ([place])   3 Days Post-Op  04/11/2017  POST-OPERATIVE DIAGNOSIS:  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  Status post resection.  Continue Imuran immunosuppression.  Rocephin/Flagyl x 5 days minimum  NGT until flatus, then clamping trial.  DVT prophylaxis - Lovenox, SCDs, etc  mobilize as tolerated to help recovery  15 minutes spent in review, evaluation, examination, counseling, and coordination of care.  More than 50% of that time was spent in counseling.  [name], M.D., F.A.C.S. Gastrointestinal and Minimally Invasive Surgery [facility] [address] [place], [place] [address] [phone] Main / Paging   04/14/2017    Subjective: (Chief complaint)  Denies much abdominal pain.  Nasogastric tube annoying with sore throat  Objective:  Vital  signs:  Vitals:   04/13/17 1334 04/13/17 1755 04/13/17 2109 04/14/17 0553  BP: 115/71  122/87 123/80  Pulse: 92  100 87  Resp: '16  16 16  ' Temp: 98.7 F (37.1 C) 99.3 F (37.4 C) 99.3 F (37.4 C) 98.9 F (37.2 C)  TempSrc: Oral  Oral Oral  SpO2: 97%  98% 99%  Weight:      Height:        Last BM Date: 04/11/17  Intake/Output   Yesterday:  07/14 0701 - 07/15 0700 In: 2119.8 [I.V.:1798.3; IV Piggyback:321.5] Out: 2400 [Urine:1500; Emesis/NG output:900] This shift:  No intake/output data recorded.  Bowel function:  Flatus: No  BM:  No  Drain: Bilious   Physical Exam:  General: Pt awake/alert/oriented x4 in no acute distress Eyes: PERRL, normal EOM.  Sclera clear.  No icterus Neuro: CN II-XII intact w/o focal sensory/motor deficits. Lymph: No head/neck/groin lymphadenopathy Psych:  No delerium/psychosis/paranoia HENT: Normocephalic, Mucus membranes moist.  No thrush Neck: Supple, No tracheal deviation Chest: No chest wall pain w good excursion CV:  Pulses intact.  Regular rhythm MS: Normal AROM mjr joints.  No obvious deformity  Abdomen: Soft.  Nondistended.  Mildly tender at incisions only.  No evidence of peritonitis.  No incarcerated hernias.  Ext:  No deformity.  No mjr edema.  No cyanosis Skin: No petechiae / purpura  Results:   Labs: Results for orders placed or performed during the hospital encounter of 04/06/17 (from the past 48 hour(s))  Creatinine, serum     Status: None   Collection Time: 04/13/17  5:14 AM  Result Value Ref Range   Creatinine, Ser 0.76 0.61 - 1.24 mg/dL   GFR calc non Af Amer >60 >60 mL/min   GFR calc  Af Amer >60 >60 mL/min    Comment: (NOTE) The eGFR has been calculated using the CKD EPI equation. This calculation has not been validated in all clinical situations. eGFR's persistently <60 mL/min signify possible Chronic Kidney Disease.   Glucose, capillary     Status: Abnormal   Collection Time: 04/13/17  7:29 AM  Result  Value Ref Range   Glucose-Capillary 144 (H) 65 - 99 mg/dL  CBC with Differential/Platelet     Status: Abnormal   Collection Time: 04/14/17  5:08 AM  Result Value Ref Range   WBC 12.9 (H) 4.0 - 10.5 K/uL   RBC 3.84 (L) 4.22 - 5.81 MIL/uL   Hemoglobin 10.7 (L) 13.0 - 17.0 g/dL   HCT 32.4 (L) 39.0 - 52.0 %   MCV 84.4 78.0 - 100.0 fL   MCH 27.9 26.0 - 34.0 pg   MCHC 33.0 30.0 - 36.0 g/dL   RDW 13.9 11.5 - 15.5 %   Platelets 318 150 - 400 K/uL   Neutrophils Relative % 77 %   Neutro Abs 10.0 (H) 1.7 - 7.7 K/uL   Lymphocytes Relative 11 %   Lymphs Abs 1.4 0.7 - 4.0 K/uL   Monocytes Relative 6 %   Monocytes Absolute 0.8 0.1 - 1.0 K/uL   Eosinophils Relative 6 %   Eosinophils Absolute 0.7 0.0 - 0.7 K/uL   Basophils Relative 0 %   Basophils Absolute 0.1 0.0 - 0.1 K/uL  Basic metabolic panel     Status: Abnormal   Collection Time: 04/14/17  5:08 AM  Result Value Ref Range   Sodium 141 135 - 145 mmol/L   Potassium 3.1 (L) 3.5 - 5.1 mmol/L   Chloride 104 101 - 111 mmol/L   CO2 28 22 - 32 mmol/L   Glucose, Bld 128 (H) 65 - 99 mg/dL   BUN <5 (L) 6 - 20 mg/dL   Creatinine, Ser 0.67 0.61 - 1.24 mg/dL   Calcium 8.2 (L) 8.9 - 10.3 mg/dL   GFR calc non Af Amer >60 >60 mL/min   GFR calc Af Amer >60 >60 mL/min    Comment: (NOTE) The eGFR has been calculated using the CKD EPI equation. This calculation has not been validated in all clinical situations. eGFR's persistently <60 mL/min signify possible Chronic Kidney Disease.    Anion gap 9 5 - 15  Glucose, capillary     Status: Abnormal   Collection Time: 04/14/17  7:38 AM  Result Value Ref Range   Glucose-Capillary 106 (H) 65 - 99 mg/dL    Imaging / Studies: No results found.  Medications / Allergies: per chart  Antibiotics: Anti-infectives    Start     Dose/Rate Route Frequency Ordered Stop   04/12/17 1200  acyclovir (ZOVIRAX) 825 mg in dextrose 5 % 150 mL IVPB     825 mg 166.5 mL/hr over 60 Minutes Intravenous Every 8 hours  04/12/17 0953     04/11/17 1400  cefTRIAXone (ROCEPHIN) 2 g in dextrose 5 % 50 mL IVPB     2 g 100 mL/hr over 30 Minutes Intravenous Every 24 hours 04/11/17 1050     04/11/17 1400  metroNIDAZOLE (FLAGYL) IVPB 500 mg  Status:  Discontinued     500 mg 100 mL/hr over 60 Minutes Intravenous Every 8 hours 04/11/17 1050 04/13/17 1429   04/11/17 1215  piperacillin-tazobactam (ZOSYN) IVPB 3.375 g     3.375 g 100 mL/hr over 30 Minutes Intravenous  Once 04/11/17 1205 04/11/17 1301   04/11/17  1155  piperacillin-tazobactam (ZOSYN) 3.375 GM/50ML IVPB    Comments:  Whitlow, Cheryl   : cabinet override      04/11/17 1155 04/11/17 1201   04/07/17 1000  valACYclovir (VALTREX) tablet 500 mg  Status:  Discontinued     500 mg Oral Daily 04/06/17 2333 04/12/17 1002   04/07/17 0600  piperacillin-tazobactam (ZOSYN) IVPB 3.375 g  Status:  Discontinued     3.375 g 12.5 mL/hr over 240 Minutes Intravenous Every 8 hours 04/06/17 2353 04/11/17 1050   04/06/17 2115  piperacillin-tazobactam (ZOSYN) IVPB 3.375 g     3.375 g 100 mL/hr over 30 Minutes Intravenous  Once 04/06/17 2110 04/06/17 2220        Note: Portions of this report may have been transcribed using voice recognition software. Every effort was made to ensure accuracy; however, inadvertent computerized transcription errors may be present.   Any transcriptional errors that result from this process are unintentional.     [name], M.D., F.A.C.S. Gastrointestinal and Minimally Invasive Surgery [facility] [address], [place], [place] [address] [phone] Main / Paging   04/14/2017

## 2017-04-14 NOTE — Progress Notes (Signed)
PROGRESS NOTE    Nathan Shaw  NAT:[id number] DOB: 1956/05/16 DOA: 04/06/2017 PCP: Hulan Fess, MD    Brief Narrative:  61 year old male who presented with abdominal cramping. Patient is known to have Crohn's disease, GERD, hypogonadism. About 4 weeks ago he had a episode of Crohn's flare, treated with 6-MP, with improvement of his symptoms. The day of admission he had acute recurrence of abdominal pain, colicky in nature, severe in intensity and worsening, localized in the right lower quadrant, then he presented to the hospital due to worsening symptoms. He was recently treated for an episode of prostatitis with ciprofloxacin. On initial physical examination blood pressure 136/89, heart rate 85, respiratory rate 16, temperature 98.2, oxygen saturation 100%, mucous membranes were moist, lungs were clear to auscultation bilaterally, heart S1-S2 present rhythmic, the abdomen was distended, had hypoactive bowel sounds, tender to palpation in the upper quadrants and more pronounced on the right lower quadrant, positive guarding at that site. Sodium 139, potassium 3.7, chloride 105, bicarbonate 25, glucose 121, BUN 8, creatinine 0.87, AST 24, ALT 24, white count 11.9, hemoglobin 13.6, hematocrit 40.7, platelets 360, urinalysis negative for infection. 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   Patient was admitted to medical floor with working diagnosis of acute Crohn's flare complicated by mesenteric abscess. Patient was placed on broad spectrum antibiotic therapy, surgery consulted. Patient underwent laparoscopic ileocecectomy and small bowel resection. Has developed post-operative ileus.    Assessment & Plan:   Principal Problem:   Crohn's ileitis, with sticture & abscess s/p ileocectomy 04/11/2017 Active Problems:   GERD (gastroesophageal reflux disease)   Crohn disease ([employer])   1.  Crohn's flare with mesenteric abscess, with viridians strp. Bacteremia. Continue IV ceftriaxone/ metronidazole #3. Blood culture positive for streptococcus parasanguinos, follow up cultures no growth. Patient Burchinal RESECTION with post-operative ileus. HasNG tube to low intermittent suction, output 900 ml, over last 24 hours.  Continue IV fluids  with isotonic saline with dextrose. Continue imuran and famotidine. Wbc at 12.9 from 16.5   2. Hypokalemia and hypomagnesemia. Will continue k repletion with kcl IV, will give a total of 80 meq today plus 2 grams of magnesium sulfate, follow on renal panel in am, renal function is preserved with serum cr at 0,67, serum bicarbonate at 28.   3. History of alcohol abuse.  Neuro checks per unit protocol. No signs of withdrawal.  4. Hypogonadism. Continue testosterone, topical.   5. History of herpes. On valacyclovir, per home regimen.   DVT prophylaxis:enoxaparin  Code Status:full  Family Communication: Disposition Plan:home    Consultants:  Surgery  Gastroenterology   Procedures:   Antimicrobials:   Ceftriaxone  Metronidazole   Subjective: Patient with mild abdominal pain, no nausea or vomiting, no chest pain or dyspnea. No stool in ostomy bag.   Objective: Vitals:   04/13/17 1334 04/13/17 1755 04/13/17 2109 04/14/17 0553  BP: 115/71  122/87 123/80  Pulse: 92  100 87  Resp: 16  16 16   Temp: 98.7 F (37.1 C) 99.3 F (37.4 C) 99.3 F (37.4 C) 98.9 F (37.2 C)  TempSrc: Oral  Oral Oral  SpO2: 97%  98% 99%  Weight:      Height:        Intake/Output Summary (Last 24 hours) at 04/14/17 1043 Last data filed at 04/14/17 0954  Gross per 24 hour  Intake  2119.83 ml  Output             2500 ml  Net          -380.17 ml   Filed Weights   04/07/17 0115  Weight: 83.6 kg (184 lb 4.9 oz)    Examination:  General exam: deconditioned E ENT: NG tube in place, no  pallor, or icterus, oral mucosa moist.  Respiratory system: No wheezing, rales or rhonchi.  Respiratory effort normal. Cardiovascular system: S1 & S2 heard, RRR. No JVD, murmurs, rubs, gallops or clicks. No pedal edema. Gastrointestinal system: Abdomen is mild distended, soft. Tender to palpation. No organomegaly or masses felt. Normal bowel sounds heard. Central nervous system: Alert and oriented. No focal neurological deficits. Extremities: Symmetric 5 x 5 power. Skin: No rashes, lesions or ulcers    Data Reviewed: I have personally reviewed following labs and imaging studies  CBC:  Recent Labs Lab 04/08/17 0503 04/10/17 0533 04/11/17 0451 04/12/17 0459 04/14/17 0508  WBC 12.3* 6.0 7.1 16.5* 12.9*  NEUTROABS 9.0* 3.5 4.5  --  10.0*  HGB 14.9 12.7* 12.5* 11.4* 10.7*  HCT 43.9 39.0 38.1* 34.8* 32.4*  MCV 83.0 83.7 83.7 82.1 84.4  PLT 331 245 268 344 124   Basic Metabolic Panel:  Recent Labs Lab 04/08/17 0503 04/10/17 0533 04/11/17 0451 04/12/17 0459 04/13/17 0514 04/14/17 0508 04/14/17 0534  NA 135 142 140 139  --  141  --   K 4.2 3.6 3.2* 3.5  --  3.1*  --   CL 105 107 108 104  --  104  --   CO2 23 25 25 27   --  28  --   GLUCOSE 143* 97 95 151*  --  128*  --   BUN 6 <5* <5* <5*  --  <5*  --   CREATININE 0.87 0.89 0.85 0.83 0.76 0.67  --   CALCIUM 8.4* 8.3* 8.4* 7.9*  --  8.2*  --   MG  --   --   --   --   --   --  1.8   GFR: Estimated Creatinine Clearance: 107.2 mL/min (by C-G formula based on SCr of 0.67 mg/dL). Liver Function Tests: No results for input(s): AST, ALT, ALKPHOS, BILITOT, PROT, ALBUMIN in the last 168 hours. No results for input(s): LIPASE, AMYLASE in the last 168 hours. No results for input(s): AMMONIA in the last 168 hours. Coagulation Profile: No results for input(s): INR, PROTIME in the last 168 hours. Cardiac Enzymes: No results for input(s): CKTOTAL, CKMB, CKMBINDEX, TROPONINI in the last 168 hours. BNP (last 3 results) No results for  input(s): PROBNP in the last 8760 hours. HbA1C: No results for input(s): HGBA1C in the last 72 hours. CBG:  Recent Labs Lab 04/10/17 0709 04/11/17 0711 04/12/17 0738 04/13/17 0729 04/14/17 0738  GLUCAP 96 90 125* 144* 106*   Lipid Profile: No results for input(s): CHOL, HDL, LDLCALC, TRIG, CHOLHDL, LDLDIRECT in the last 72 hours. Thyroid Function Tests: No results for input(s): TSH, T4TOTAL, FREET4, T3FREE, THYROIDAB in the last 72 hours. Anemia Panel: No results for input(s): VITAMINB12, FOLATE, FERRITIN, TIBC, IRON, RETICCTPCT in the last 72 hours. Sepsis Labs: No results for input(s): PROCALCITON, LATICACIDVEN in the last 168 hours.  Recent Results (from the past 240 hour(s))  Culture, blood (routine x 2)     Status: Abnormal   Collection Time: 04/07/17  1:00 AM  Result Value Ref Range Status   Specimen Description BLOOD BLOOD LEFT HAND  Final   Special Requests   Final    BOTTLES DRAWN AEROBIC ONLY Blood Culture adequate volume   Culture  Setup Time   Final    GRAM POSITIVE COCCI IN CHAINS AEROBIC BOTTLE ONLY CRITICAL RESULT CALLED TO, READ BACK BY AND VERIFIED WITH: M LILLISTON,PHARMD AT 1023 04/08/17 BY L BENFIELD    Culture (A)  Final    STREPTOCOCCUS PARASANGUINIS THE SIGNIFICANCE OF ISOLATING THIS ORGANISM FROM A SINGLE SET OF BLOOD CULTURES WHEN MULTIPLE SETS ARE DRAWN IS UNCERTAIN. PLEASE NOTIFY THE MICROBIOLOGY DEPARTMENT WITHIN ONE WEEK IF SPECIATION AND SENSITIVITIES ARE REQUIRED. Performed at [facility], [place] [address], [place], [place] [address]    Report Status 04/10/2017 FINAL  Final  Culture, blood (routine x 2)     Status: Abnormal   Collection Time: 04/07/17  1:00 AM  Result Value Ref Range Status   Specimen Description BLOOD RIGHT ANTECUBITAL  Final   Special Requests   Final    BOTTLES DRAWN AEROBIC ONLY Blood Culture adequate volume   Culture  Setup Time   Final    GRAM POSITIVE COCCI IN CHAINS AEROBIC BOTTLE ONLY CRITICAL RESULT CALLED  TO, READ BACK BY AND VERIFIED WITH: M LILLISTON,PHARMD AT 1023 04/08/17 BY L BENFIELD    Culture (A)  Final    STREPTOCOCCUS CRISTATUS THE SIGNIFICANCE OF ISOLATING THIS ORGANISM FROM A SINGLE SET OF BLOOD CULTURES WHEN MULTIPLE SETS ARE DRAWN IS UNCERTAIN. PLEASE NOTIFY THE MICROBIOLOGY DEPARTMENT WITHIN ONE WEEK IF SPECIATION AND SENSITIVITIES ARE REQUIRED. Performed at [facility], [place] [address], [place], [place] [address]    Report Status 04/10/2017 FINAL  Final  Blood Culture ID Panel (Reflexed)     Status: Abnormal   Collection Time: 04/07/17  1:00 AM  Result Value Ref Range Status   Enterococcus species NOT DETECTED NOT DETECTED Final   Listeria monocytogenes NOT DETECTED NOT DETECTED Final   Staphylococcus species NOT DETECTED NOT DETECTED Final   Staphylococcus aureus NOT DETECTED NOT DETECTED Final   Streptococcus species DETECTED (A) NOT DETECTED Final    Comment: Not Enterococcus species, Streptococcus agalactiae, Streptococcus pyogenes, or Streptococcus pneumoniae. CRITICAL RESULT CALLED TO, READ BACK BY AND VERIFIED WITH: M LILLISTON,PHARMD AT 1023 04/08/17 BY L BENFIELD    Streptococcus agalactiae NOT DETECTED NOT DETECTED Final   Streptococcus pneumoniae NOT DETECTED NOT DETECTED Final   Streptococcus pyogenes NOT DETECTED NOT DETECTED Final   Acinetobacter baumannii NOT DETECTED NOT DETECTED Final   Enterobacteriaceae species NOT DETECTED NOT DETECTED Final   Enterobacter cloacae complex NOT DETECTED NOT DETECTED Final   Escherichia coli NOT DETECTED NOT DETECTED Final   Klebsiella oxytoca NOT DETECTED NOT DETECTED Final   Klebsiella pneumoniae NOT DETECTED NOT DETECTED Final   Proteus species NOT DETECTED NOT DETECTED Final   Serratia marcescens NOT DETECTED NOT DETECTED Final   Haemophilus influenzae NOT DETECTED NOT DETECTED Final   Neisseria meningitidis NOT DETECTED NOT DETECTED Final   Pseudomonas aeruginosa NOT DETECTED NOT DETECTED Final   Candida  albicans NOT DETECTED NOT DETECTED Final   Candida glabrata NOT DETECTED NOT DETECTED Final   Candida krusei NOT DETECTED NOT DETECTED Final   Candida parapsilosis NOT DETECTED NOT DETECTED Final   Candida tropicalis NOT DETECTED NOT DETECTED Final    Comment: Performed at [facility], [place], [place], [place] [address]  Culture, blood (Routine X 2) w Reflex to ID Panel     Status: None (Preliminary result)  Collection Time: 04/09/17  3:22 PM  Result Value Ref Range Status   Specimen Description BLOOD RIGHT HAND  Final   Special Requests   Final    BOTTLES DRAWN AEROBIC AND ANAEROBIC Blood Culture adequate volume   Culture   Final    NO GROWTH 4 DAYS Performed at [facility], [address], [place], [place] [address]    Report Status PENDING  Incomplete  Culture, blood (Routine X 2) w Reflex to ID Panel     Status: None (Preliminary result)   Collection Time: 04/09/17  5:33 PM  Result Value Ref Range Status   Specimen Description BLOOD LEFT HAND  Final   Special Requests   Final    BOTTLES DRAWN AEROBIC ONLY Blood Culture adequate volume   Culture   Final    NO GROWTH 4 DAYS Performed at [facility], [place] [address] [place],  [address]    Report Status PENDING  Incomplete  Surgical pcr screen     Status: None   Collection Time: 04/11/17 10:06 AM  Result Value Ref Range Status   MRSA, PCR NEGATIVE NEGATIVE Final   Staphylococcus aureus NEGATIVE NEGATIVE Final    Comment:        The Xpert SA Assay (FDA approved for NASAL specimens in patients over 85 years of age), is one component of a comprehensive surveillance program.  Test performance has been validated by [facility] for patients greater than or equal to 62 year old. It is not intended to diagnose infection nor to guide or monitor treatment.          Radiology Studies: No results found.      Scheduled Meds: . azaTHIOprine  100 mg Oral Daily  . bisacodyl  10 mg  Rectal Daily  . cholecalciferol  2,000 Units Oral Daily  . enoxaparin (LOVENOX) injection  40 mg Subcutaneous QHS  . lip balm  1 application Topical BID  . loratadine  10 mg Oral Daily  . mouth rinse  15 mL Mouth Rinse q12n4p  . omega-3 acid ethyl esters  1,000 mg Oral Daily  . testosterone  5 g Transdermal Daily   Continuous Infusions: . acyclovir Stopped (04/13/17 2250)  . cefTRIAXone (ROCEPHIN)  IV Stopped (04/13/17 1345)  . dextrose 5 % and 0.9% NaCl 1,000 mL (04/14/17 0050)  . famotidine (PEPCID) IV Stopped (04/13/17 1026)  . lactated ringers    . methocarbamol (ROBAXIN)  IV    . methocarbamol (ROBAXIN)  IV Stopped (04/13/17 2219)  . potassium chloride       LOS: 8 days     Tawni Millers, MD [facility] Pager [phone]  If 7PM-7AM, please contact night-coverage [email or web address] Password [facility] 04/14/2017, 10:43 AM

## 2017-04-14 NOTE — Progress Notes (Addendum)
IV site reddened and painful. IV called to restart new site. Pt has ambulated around the unit x3 twice with wife. He also sat up in the chair for several hours.

## 2017-04-15 LAB — BASIC METABOLIC PANEL
Anion gap: 10 (ref 5–15)
BUN: <5 mg/dL — ABNORMAL LOW (ref 6–20)
CO2: 26 mmol/L (ref 22–32)
Calcium: 8.1 mg/dL — ABNORMAL LOW (ref 8.9–10.3)
Chloride: 104 mmol/L (ref 101–111)
Creatinine, Ser: 0.7 mg/dL (ref 0.61–1.24)
GFR calc Af Amer: >60 mL/min (ref >60)
GFR calc non Af Amer: >60 mL/min (ref >60)
Glucose, Bld: 114 mg/dL — ABNORMAL HIGH (ref 65–99)
Potassium: 3.6 mmol/L (ref 3.5–5.1)
Sodium: 140 mmol/L (ref 135–145)

## 2017-04-15 LAB — CBC WITH DIFFERENTIAL/PLATELET
Basophils Absolute: 0.1 10*3/uL (ref 0.0–0.1)
Basophils Relative: 1 %
Eosinophils Absolute: 1.2 10*3/uL — ABNORMAL HIGH (ref 0.0–0.7)
Eosinophils Relative: 10 %
HCT: 33.2 % — ABNORMAL LOW (ref 39.0–52.0)
Hemoglobin: 11 g/dL — ABNORMAL LOW (ref 13.0–17.0)
Lymphocytes Relative: 12 %
Lymphs Abs: 1.4 10*3/uL (ref 0.7–4.0)
MCH: 27.8 pg (ref 26.0–34.0)
MCHC: 33.1 g/dL (ref 30.0–36.0)
MCV: 83.8 fL (ref 78.0–100.0)
Monocytes Absolute: 0.8 10*3/uL (ref 0.1–1.0)
Monocytes Relative: 7 %
Neutro Abs: 8.1 10*3/uL — ABNORMAL HIGH (ref 1.7–7.7)
Neutrophils Relative %: 70 %
Platelets: 340 10*3/uL (ref 150–400)
RBC: 3.96 MIL/uL — ABNORMAL LOW (ref 4.22–5.81)
RDW: 14 % (ref 11.5–15.5)
WBC: 11.6 10*3/uL — ABNORMAL HIGH (ref 4.0–10.5)

## 2017-04-15 LAB — GLUCOSE, CAPILLARY: Glucose-Capillary: 124 mg/dL — ABNORMAL HIGH (ref 65–99)

## 2017-04-15 MED ORDER — PANTOPRAZOLE SODIUM 40 MG IV SOLR
40.0000 mg | INTRAVENOUS | Status: DC
  Administered 2017-04-15 – 2017-04-16 (×2): 40 mg via INTRAVENOUS
  Filled 2017-04-15 (×2): qty 40

## 2017-04-15 NOTE — Progress Notes (Signed)
[facility] Progress Note  4 Days Post-Op  Subjective: CC:  Abdominal pain controlled with IV robaxin, requiring less morphine. Reports 2 episodes of flatus last night, one small BM after dulcolax suppository. Small amount of flatus this AM. Mobilizing in hallway. Pt reports that he is not eating very many ice chips.  Afebrile, VSS Objective: Vital signs in last 24 hours: Temp:  [97.8 F (36.6 C)-99.6 F (37.6 C)] 97.8 F (36.6 C) (07/16 0554) Pulse Rate:  [92-102] 92 (07/16 0554) Resp:  [14-16] 14 (07/16 0554) BP: (115-126)/(72-74) 115/74 (07/16 0554) SpO2:  [99 %] 99 % (07/16 0554) Last BM Date: 04/14/17  Intake/Output from previous day: 07/15 0701 - 07/16 0700 In: 2084.2 [I.V.:1079.2; NG/GT:700; IV Piggyback:305] Out: 1250 [Emesis/NG output:1250] Intake/Output this shift: No intake/output data recorded.  PE: Gen:  Alert, NAD, pleasant Pulm:  Normal effort Abd: Soft, non-tender, mild distention, incision c/d/i, hypoactive BS  NGT 1,250 cc/24 h, brown/bilious Skin: warm and dry, no rashes  Psych: A&Ox3   Lab Results:   Recent Labs  04/14/17 0508 04/15/17 0500  WBC 12.9* 11.6*  HGB 10.7* 11.0*  HCT 32.4* 33.2*  PLT 318 340   BMET  Recent Labs  04/14/17 0508 04/15/17 0500  NA 141 140  K 3.1* 3.6  CL 104 104  CO2 28 26  GLUCOSE 128* 114*  BUN <5* <5*  CREATININE 0.67 0.70  CALCIUM 8.2* 8.1*   PT/INR No results for input(s): LABPROT, INR in the last 72 hours. CMP     Component Value Date/Time   NA 140 04/15/2017 0500   K 3.6 04/15/2017 0500   CL 104 04/15/2017 0500   CO2 26 04/15/2017 0500   GLUCOSE 114 (H) 04/15/2017 0500   BUN <5 (L) 04/15/2017 0500   CREATININE 0.70 04/15/2017 0500   CALCIUM 8.1 (L) 04/15/2017 0500   PROT 6.7 04/06/2017 1756   ALBUMIN 2.8 (L) 04/06/2017 1756   AST 24 04/06/2017 1756   ALT 24 04/06/2017 1756   ALKPHOS 118 04/06/2017 1756   BILITOT 0.5 04/06/2017 1756   GFRNONAA >60 04/15/2017 0500   GFRAA  >60 04/15/2017 0500   Lipase     Component Value Date/Time   LIPASE 20 04/06/2017 1756       Studies/Results: No results found.  Anti-infectives: Anti-infectives    Start     Dose/Rate Route Frequency Ordered Stop   04/14/17 1200  metroNIDAZOLE (FLAGYL) IVPB 500 mg     500 mg 100 mL/hr over 60 Minutes Intravenous Every 8 hours 04/14/17 1045     04/12/17 1200  acyclovir (ZOVIRAX) 825 mg in dextrose 5 % 150 mL IVPB     825 mg 166.5 mL/hr over 60 Minutes Intravenous Every 8 hours 04/12/17 0953     04/11/17 1400  cefTRIAXone (ROCEPHIN) 2 g in dextrose 5 % 50 mL IVPB     2 g 100 mL/hr over 30 Minutes Intravenous Every 24 hours 04/11/17 1050     04/11/17 1400  metroNIDAZOLE (FLAGYL) IVPB 500 mg  Status:  Discontinued     500 mg 100 mL/hr over 60 Minutes Intravenous Every 8 hours 04/11/17 1050 04/13/17 1429   04/11/17 1215  piperacillin-tazobactam (ZOSYN) IVPB 3.375 g     3.375 g 100 mL/hr over 30 Minutes Intravenous  Once 04/11/17 1205 04/11/17 1301   04/11/17 1155  piperacillin-tazobactam (ZOSYN) 3.375 GM/50ML IVPB    Comments:  [place] Session   : cabinet override      04/11/17 1155 04/11/17 1201  04/07/17 1000  valACYclovir (VALTREX) tablet 500 mg  Status:  Discontinued     500 mg Oral Daily 04/06/17 2333 04/12/17 1002   04/07/17 0600  piperacillin-tazobactam (ZOSYN) IVPB 3.375 g  Status:  Discontinued     3.375 g 12.5 mL/hr over 240 Minutes Intravenous Every 8 hours 04/06/17 2353 04/11/17 1050   04/06/17 2115  piperacillin-tazobactam (ZOSYN) IVPB 3.375 g     3.375 g 100 mL/hr over 30 Minutes Intravenous  Once 04/06/17 2110 04/06/17 2220     Assessment/Plan Crohn's ileitis w/ small bowel stricture and mesenteric abscess POD#4 S/P LAPAROSCOPIC ILEOCECECTOMY AND SMALL BOWEL RESECTION 04/11/17. Dr. Leighton Ruff - afebrile, leukocytosis improving - ileus slowly improving, minimal flatus, small BM with dulcolax, hypoactive bowel sounds high NGT OP -- continue NG tube to LIWS  today,ok to repeat dulcolax.  - mobilize/IS  Crohn's - on imuran and humira  Hypogonadism - topical testosterone  PMH herpes simplex - Acyclovir FEN - NPO, IVF, NGT, BMET WNL, continue PPI  VTE - Lovenox, SCD's ID - WBC 11.6, Zosyn 7/7-7/12, Rocephin/Flagyl 7/13 >> (day #4); continue abx with plans to d/c in 24-48 hours.   LOS: 9 days    [facility] 04/15/2017, 7:57 AM Pager: [phone] Consults: [phone] Mon-Fri 7:00 am-4:30 pm Sat-Sun 7:00 am-11:30 am

## 2017-04-15 NOTE — Progress Notes (Signed)
Patient ID: Nathan Shaw, male   DOB: 08/26/1956, 61 y.o.   MRN: [id number]  PROGRESS NOTE    Amiere Cawley  CVE:[id number] DOB: [birth year] DOA: 04/06/2017 PCP: Hulan Fess, MD   Brief Narrative:  61 year old male presented with abdominal cramping. Patient is known to have Crohn's disease, GERD, hypogonadism. About 4 weeks ago he had a episode of Crohn's flare, treated with 6-MP, with improvement of his symptoms. The day of admission he had acute recurrence of abdominal pain. He was recently treated for an episode of prostatitis with ciprofloxacin. 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   Patient was admitted to medical floor with working diagnosis of acute Crohn's flare complicated by mesenteric abscess. Patient was placed on broad spectrum antibiotic therapy, GI and surgery consulted. Patient underwent laparoscopic ileocecectomy and small bowel resection on 04/11/2017. Has developed post-operative ileus and has NG tube.  Assessment & Plan:   Principal Problem:   Crohn's ileitis, with sticture & abscess s/p ileocectomy 04/11/2017 Active Problems:   GERD (gastroesophageal reflux disease)   Crohn disease ([place])    1. Crohn's flare with ileitis, mesenteric abscess and fistula, with viridians Streptococcus bacteremia  - Continue Rocephin and Flagyl till at least 5 days postop as per surgery recommendations. Follow cultures no growth.  - S/pLAPAROSCOPIC ILEOCECECTOMY AND SMALL BOWEL RESECTION on 04/11/2017 - Continue Imuran. Outpatient follow-up with GI for the need for resumption of Humira.  2. Post-operative ileus - Continue NG tube  - Continue IV fluids   - Follow surgery conditions  2. History of alcohol abuse.   no signs of withdrawal.  3. Hypogonadism. testosterone, topical.   4. History of herpes.  - Resume suppressive Valtrex on discharge. Discontinue IV  Cipro. For now  DVT prophylaxis:enoxaparin  Code Status:full  Family Communication:Discussed with patient and significant other present at bedside Disposition Plan:home in 2-3 days   Consultants:  Surgery  Gastroenterology   Procedures:   Antimicrobials:   Ceftriaxone from 04/12/2017 onwards  Metronidazole from 04/11/2017 onwards  Zosyn from 04/06/2017 to 04/11/2017  Acyclovir from 04/12/2017 onwards  Subjective: Patient seen and examined at bedside. He still has NG tube in place. He had 2 small bowel movements yesterday and passed gas. No bowel movements today. No overnight fever or vomiting.  Objective: Vitals:   04/14/17 0553 04/14/17 1442 04/14/17 2049 04/15/17 0554  BP: 123/80 124/72 126/74 115/74  Pulse: 87 92 (!) 102 92  Resp: 16 16 16 14   Temp: 98.9 F (37.2 C) 99.6 F (37.6 C) 99.1 F (37.3 C) 97.8 F (36.6 C)  TempSrc: Oral Oral Oral Oral  SpO2: 99% 99% 99% 99%  Weight:      Height:        Intake/Output Summary (Last 24 hours) at 04/15/17 0938 Last data filed at 04/15/17 0554  Gross per 24 hour  Intake          2084.17 ml  Output             1250 ml  Net           834.17 ml   Filed Weights   04/07/17 0115  Weight: 83.6 kg (184 lb 4.9 oz)    Examination:  General exam: Appears calm and comfortable  Respiratory system: Bilateral decreased breath sound at bases Cardiovascular system: S1 & S2 heard, Rate controlled  Gastrointestinal system: Abdomen is nondistended, soft and nontender. Bowel sounds sluggish  Extremities: No cyanosis, clubbing, edema    Data Reviewed: I have personally reviewed following labs and imaging studies  CBC:  Recent Labs Lab 04/10/17 0533 04/11/17 0451 04/12/17 0459 04/14/17 0508 04/15/17 0500  WBC 6.0 7.1 16.5* 12.9* 11.6*  NEUTROABS 3.5 4.5  --  10.0* 8.1*  HGB 12.7* 12.5* 11.4* 10.7* 11.0*  HCT 39.0 38.1* 34.8* 32.4* 33.2*  MCV 83.7 83.7 82.1 84.4 83.8  PLT 245 268 344 318 283   Basic  Metabolic Panel:  Recent Labs Lab 04/10/17 0533 04/11/17 0451 04/12/17 0459 04/13/17 0514 04/14/17 0508 04/14/17 0534 04/15/17 0500  NA 142 140 139  --  141  --  140  K 3.6 3.2* 3.5  --  3.1*  --  3.6  CL 107 108 104  --  104  --  104  CO2 25 25 27   --  28  --  26  GLUCOSE 97 95 151*  --  128*  --  114*  BUN <5* <5* <5*  --  <5*  --  <5*  CREATININE 0.89 0.85 0.83 0.76 0.67  --  0.70  CALCIUM 8.3* 8.4* 7.9*  --  8.2*  --  8.1*  MG  --   --   --   --   --  1.8  --    GFR: Estimated Creatinine Clearance: 107.2 mL/min (by C-G formula based on SCr of 0.7 mg/dL). Liver Function Tests: No results for input(s): AST, ALT, ALKPHOS, BILITOT, PROT, ALBUMIN in the last 168 hours. No results for input(s): LIPASE, AMYLASE in the last 168 hours. No results for input(s): AMMONIA in the last 168 hours. Coagulation Profile: No results for input(s): INR, PROTIME in the last 168 hours. Cardiac Enzymes: No results for input(s): CKTOTAL, CKMB, CKMBINDEX, TROPONINI in the last 168 hours. BNP (last 3 results) No results for input(s): PROBNP in the last 8760 hours. HbA1C: No results for input(s): HGBA1C in the last 72 hours. CBG:  Recent Labs Lab 04/11/17 0711 04/12/17 0738 04/13/17 0729 04/14/17 0738 04/15/17 0741  GLUCAP 90 125* 144* 106* 124*   Lipid Profile: No results for input(s): CHOL, HDL, LDLCALC, TRIG, CHOLHDL, LDLDIRECT in the last 72 hours. Thyroid Function Tests: No results for input(s): TSH, T4TOTAL, FREET4, T3FREE, THYROIDAB in the last 72 hours. Anemia Panel: No results for input(s): VITAMINB12, FOLATE, FERRITIN, TIBC, IRON, RETICCTPCT in the last 72 hours. Sepsis Labs: No results for input(s): PROCALCITON, LATICACIDVEN in the last 168 hours.  Recent Results (from the past 240 hour(s))  Culture, blood (routine x 2)     Status: Abnormal   Collection Time: 04/07/17  1:00 AM  Result Value Ref Range Status   Specimen Description BLOOD BLOOD LEFT HAND  Final   Special  Requests   Final    BOTTLES DRAWN AEROBIC ONLY Blood Culture adequate volume   Culture  Setup Time   Final    GRAM POSITIVE COCCI IN CHAINS AEROBIC BOTTLE ONLY CRITICAL RESULT CALLED TO, READ BACK BY AND VERIFIED WITH: M LILLISTON,PHARMD AT 1023 04/08/17 BY L BENFIELD    Culture (A)  Final    STREPTOCOCCUS PARASANGUINIS THE SIGNIFICANCE OF ISOLATING THIS ORGANISM FROM A SINGLE SET OF BLOOD CULTURES WHEN MULTIPLE SETS ARE DRAWN IS UNCERTAIN. PLEASE NOTIFY THE MICROBIOLOGY DEPARTMENT WITHIN ONE WEEK IF SPECIATION AND SENSITIVITIES ARE REQUIRED. Performed at [facility], [place] [address] [place], [place] [address]    Report Status 04/10/2017 FINAL  Final  Culture, blood (routine x 2)  Status: Abnormal   Collection Time: 04/07/17  1:00 AM  Result Value Ref Range Status   Specimen Description BLOOD RIGHT ANTECUBITAL  Final   Special Requests   Final    BOTTLES DRAWN AEROBIC ONLY Blood Culture adequate volume   Culture  Setup Time   Final    GRAM POSITIVE COCCI IN CHAINS AEROBIC BOTTLE ONLY CRITICAL RESULT CALLED TO, READ BACK BY AND VERIFIED WITH: M LILLISTON,PHARMD AT 1023 04/08/17 BY L BENFIELD    Culture (A)  Final    STREPTOCOCCUS CRISTATUS THE SIGNIFICANCE OF ISOLATING THIS ORGANISM FROM A SINGLE SET OF BLOOD CULTURES WHEN MULTIPLE SETS ARE DRAWN IS UNCERTAIN. PLEASE NOTIFY THE MICROBIOLOGY DEPARTMENT WITHIN ONE WEEK IF SPECIATION AND SENSITIVITIES ARE REQUIRED. Performed at [facility], [place] [address] [place], [place] [address]    Report Status 04/10/2017 FINAL  Final  Blood Culture ID Panel (Reflexed)     Status: Abnormal   Collection Time: 04/07/17  1:00 AM  Result Value Ref Range Status   Enterococcus species NOT DETECTED NOT DETECTED Final   Listeria monocytogenes NOT DETECTED NOT DETECTED Final   Staphylococcus species NOT DETECTED NOT DETECTED Final   Staphylococcus aureus NOT DETECTED NOT DETECTED Final   Streptococcus species DETECTED (A) NOT DETECTED Final      Comment: Not Enterococcus species, Streptococcus agalactiae, Streptococcus pyogenes, or Streptococcus pneumoniae. CRITICAL RESULT CALLED TO, READ BACK BY AND VERIFIED WITH: M LILLISTON,PHARMD AT 1023 04/08/17 BY L BENFIELD    Streptococcus agalactiae NOT DETECTED NOT DETECTED Final   Streptococcus pneumoniae NOT DETECTED NOT DETECTED Final   Streptococcus pyogenes NOT DETECTED NOT DETECTED Final   Acinetobacter baumannii NOT DETECTED NOT DETECTED Final   Enterobacteriaceae species NOT DETECTED NOT DETECTED Final   Enterobacter cloacae complex NOT DETECTED NOT DETECTED Final   Escherichia coli NOT DETECTED NOT DETECTED Final   Klebsiella oxytoca NOT DETECTED NOT DETECTED Final   Klebsiella pneumoniae NOT DETECTED NOT DETECTED Final   Proteus species NOT DETECTED NOT DETECTED Final   Serratia marcescens NOT DETECTED NOT DETECTED Final   Haemophilus influenzae NOT DETECTED NOT DETECTED Final   Neisseria meningitidis NOT DETECTED NOT DETECTED Final   Pseudomonas aeruginosa NOT DETECTED NOT DETECTED Final   Candida albicans NOT DETECTED NOT DETECTED Final   Candida glabrata NOT DETECTED NOT DETECTED Final   Candida krusei NOT DETECTED NOT DETECTED Final   Candida parapsilosis NOT DETECTED NOT DETECTED Final   Candida tropicalis NOT DETECTED NOT DETECTED Final    Comment: Performed at [facility], [place]. [address] [place], [place] [address]  Culture, blood (Routine X 2) w Reflex to ID Panel     Status: None   Collection Time: 04/09/17  3:22 PM  Result Value Ref Range Status   Specimen Description BLOOD RIGHT HAND  Final   Special Requests   Final    BOTTLES DRAWN AEROBIC AND ANAEROBIC Blood Culture adequate volume   Culture   Final    NO GROWTH 5 DAYS Performed at [facility], [place] [address], [place], [place] [address]    Report Status 04/14/2017 FINAL  Final  Culture, blood (Routine X 2) w Reflex to ID Panel     Status: None   Collection Time: 04/09/17  5:33 PM  Result  Value Ref Range Status   Specimen Description BLOOD LEFT HAND  Final   Special Requests   Final    BOTTLES DRAWN AEROBIC ONLY Blood Culture adequate volume   Culture  Final    NO GROWTH 5 DAYS Performed at [facility], [place] [address], [place], [place] [address]    Report Status 04/14/2017 FINAL  Final  Surgical pcr screen     Status: None   Collection Time: 04/11/17 10:06 AM  Result Value Ref Range Status   MRSA, PCR NEGATIVE NEGATIVE Final   Staphylococcus aureus NEGATIVE NEGATIVE Final    Comment:        The Xpert SA Assay (FDA approved for NASAL specimens in patients over 45 years of age), is one component of a comprehensive surveillance program.  Test performance has been validated by [facility] for patients greater than or equal to 46 year old. It is not intended to diagnose infection nor to guide or monitor treatment.          Radiology Studies: No results found.      Scheduled Meds: . azaTHIOprine  100 mg Oral Daily  . bisacodyl  10 mg Rectal Daily  . cholecalciferol  2,000 Units Oral Daily  . enoxaparin (LOVENOX) injection  40 mg Subcutaneous QHS  . lip balm  1 application Topical BID  . loratadine  10 mg Oral Daily  . mouth rinse  15 mL Mouth Rinse q12n4p  . omega-3 acid ethyl esters  1,000 mg Oral Daily  . pantoprazole (PROTONIX) IV  40 mg Intravenous Q24H  . testosterone  5 g Transdermal Daily   Continuous Infusions: . cefTRIAXone (ROCEPHIN)  IV Stopped (04/14/17 2011)  . dextrose 5 % and 0.9% NaCl 50 mL/hr at 04/14/17 2348  . lactated ringers    . methocarbamol (ROBAXIN)  IV    . methocarbamol (ROBAXIN)  IV Stopped (04/15/17 4469)  . metronidazole Stopped (04/15/17 0457)     LOS: 9 days        Aline August, MD [facility] Pager [phone]  If 7PM-7AM, please contact night-coverage [email or web address] Password [facility] 04/15/2017, 9:38 AM

## 2017-04-15 NOTE — Progress Notes (Signed)
NG tube canister had total of 750 ml of dark liquid when changed.from 6am to 1045 250 was noted of output from NG.

## 2017-04-16 LAB — GLUCOSE, CAPILLARY: Glucose-Capillary: 125 mg/dL — ABNORMAL HIGH (ref 65–99)

## 2017-04-16 LAB — CBC WITH DIFFERENTIAL/PLATELET
Basophils Absolute: 0 10*3/uL (ref 0.0–0.1)
Basophils Relative: 0 %
Eosinophils Absolute: 1 10*3/uL — ABNORMAL HIGH (ref 0.0–0.7)
Eosinophils Relative: 11 %
HCT: 30.7 % — ABNORMAL LOW (ref 39.0–52.0)
Hemoglobin: 10 g/dL — ABNORMAL LOW (ref 13.0–17.0)
Lymphocytes Relative: 16 %
Lymphs Abs: 1.5 10*3/uL (ref 0.7–4.0)
MCH: 27.5 pg (ref 26.0–34.0)
MCHC: 32.6 g/dL (ref 30.0–36.0)
MCV: 84.3 fL (ref 78.0–100.0)
Monocytes Absolute: 0.6 10*3/uL (ref 0.1–1.0)
Monocytes Relative: 6 %
Neutro Abs: 5.9 10*3/uL (ref 1.7–7.7)
Neutrophils Relative %: 67 %
Platelets: 375 10*3/uL (ref 150–400)
RBC: 3.64 MIL/uL — ABNORMAL LOW (ref 4.22–5.81)
RDW: 14.2 % (ref 11.5–15.5)
WBC: 8.9 10*3/uL (ref 4.0–10.5)

## 2017-04-16 LAB — BASIC METABOLIC PANEL
Anion gap: 8 (ref 5–15)
BUN: 7 mg/dL (ref 6–20)
CO2: 27 mmol/L (ref 22–32)
Calcium: 8.3 mg/dL — ABNORMAL LOW (ref 8.9–10.3)
Chloride: 105 mmol/L (ref 101–111)
Creatinine, Ser: 0.79 mg/dL (ref 0.61–1.24)
GFR calc Af Amer: >60 mL/min (ref >60)
GFR calc non Af Amer: >60 mL/min (ref >60)
Glucose, Bld: 108 mg/dL — ABNORMAL HIGH (ref 65–99)
Potassium: 3.2 mmol/L — ABNORMAL LOW (ref 3.5–5.1)
Sodium: 140 mmol/L (ref 135–145)

## 2017-04-16 LAB — MAGNESIUM: Magnesium: 2 mg/dL (ref 1.7–2.4)

## 2017-04-16 MED ORDER — POTASSIUM CHLORIDE 10 MEQ/100ML IV SOLN
10.0000 meq | INTRAVENOUS | Status: AC
  Administered 2017-04-16 (×3): 10 meq via INTRAVENOUS
  Filled 2017-04-16 (×3): qty 100

## 2017-04-16 NOTE — Progress Notes (Signed)
[facility] Progress Note  5 Days Post-Op  Subjective: CC:  Mild abdominal soreness. Denies distention. Feels hungry. Having minimal flatus. Had a BM yesterday after dulcolax suppository. Mobilizing. Still with 900 cc out of NG in 24, less dark, color changed to light green/bilious.  Objective: Vital signs in last 24 hours: Temp:  [98.4 F (36.9 C)-98.6 F (37 C)] 98.6 F (37 C) (07/17 0428) Pulse Rate:  [95-110] 95 (07/17 0428) Resp:  [15-17] 17 (07/17 0428) BP: (101-122)/(71-79) 101/71 (07/17 0428) SpO2:  [96 %-99 %] 96 % (07/17 0428) Last BM Date: 04/14/17  Intake/Output from previous day: 07/16 0701 - 07/17 0700 In: 1020 [P.O.:120; NG/GT:900] Out: -  Intake/Output this shift: No intake/output data recorded.  PE: Gen:  Alert, NAD, pleasant Pulm:  Normal effort Abd: Soft, appropriately tender, mild distention, incision c/d/i, +BS             NGT 900 cc/24 h, bilious Skin: warm and dry, no rashes  Psych: A&Ox3   Lab Results:   Recent Labs  04/15/17 0500 04/16/17 0521  WBC 11.6* 8.9  HGB 11.0* 10.0*  HCT 33.2* 30.7*  PLT 340 375   BMET  Recent Labs  04/15/17 0500 04/16/17 0521  NA 140 140  K 3.6 3.2*  CL 104 105  CO2 26 27  GLUCOSE 114* 108*  BUN <5* 7  CREATININE 0.70 0.79  CALCIUM 8.1* 8.3*   PT/INR No results for input(s): LABPROT, INR in the last 72 hours. CMP     Component Value Date/Time   NA 140 04/16/2017 0521   K 3.2 (L) 04/16/2017 0521   CL 105 04/16/2017 0521   CO2 27 04/16/2017 0521   GLUCOSE 108 (H) 04/16/2017 0521   BUN 7 04/16/2017 0521   CREATININE 0.79 04/16/2017 0521   CALCIUM 8.3 (L) 04/16/2017 0521   PROT 6.7 04/06/2017 1756   ALBUMIN 2.8 (L) 04/06/2017 1756   AST 24 04/06/2017 1756   ALT 24 04/06/2017 1756   ALKPHOS 118 04/06/2017 1756   BILITOT 0.5 04/06/2017 1756   GFRNONAA >60 04/16/2017 0521   GFRAA >60 04/16/2017 0521   Lipase     Component Value Date/Time   LIPASE 20 04/06/2017 1756        Studies/Results: No results found.  Anti-infectives: Anti-infectives    Start     Dose/Rate Route Frequency Ordered Stop   04/14/17 1200  metroNIDAZOLE (FLAGYL) IVPB 500 mg     500 mg 100 mL/hr over 60 Minutes Intravenous Every 8 hours 04/14/17 1045     04/12/17 1200  acyclovir (ZOVIRAX) 825 mg in dextrose 5 % 150 mL IVPB  Status:  Discontinued     825 mg 166.5 mL/hr over 60 Minutes Intravenous Every 8 hours 04/12/17 0953 04/15/17 0937   04/11/17 1400  cefTRIAXone (ROCEPHIN) 2 g in dextrose 5 % 50 mL IVPB     2 g 100 mL/hr over 30 Minutes Intravenous Every 24 hours 04/11/17 1050     04/11/17 1400  metroNIDAZOLE (FLAGYL) IVPB 500 mg  Status:  Discontinued     500 mg 100 mL/hr over 60 Minutes Intravenous Every 8 hours 04/11/17 1050 04/13/17 1429   04/11/17 1215  piperacillin-tazobactam (ZOSYN) IVPB 3.375 g     3.375 g 100 mL/hr over 30 Minutes Intravenous  Once 04/11/17 1205 04/11/17 1301   04/11/17 1155  piperacillin-tazobactam (ZOSYN) 3.375 GM/50ML IVPB    Comments:  [place] Session   : cabinet override      04/11/17  1155 04/11/17 1201   04/07/17 1000  valACYclovir (VALTREX) tablet 500 mg  Status:  Discontinued     500 mg Oral Daily 04/06/17 2333 04/12/17 1002   04/07/17 0600  piperacillin-tazobactam (ZOSYN) IVPB 3.375 g  Status:  Discontinued     3.375 g 12.5 mL/hr over 240 Minutes Intravenous Every 8 hours 04/06/17 2353 04/11/17 1050   04/06/17 2115  piperacillin-tazobactam (ZOSYN) IVPB 3.375 g     3.375 g 100 mL/hr over 30 Minutes Intravenous  Once 04/06/17 2110 04/06/17 2220       Assessment/Plan Crohn's ileitis w/ small bowel stricture and mesenteric abscess POD#5 S/P LAPAROSCOPIC ILEOCECECTOMY AND SMALL BOWEL RESECTION 04/11/17. Dr. Leighton Ruff - afebrile, VSS - ileus slowly improving, minimal flatus, +BM with dulcolax, bett bowel sounds - NGT clamping trial this morning, check residuals at 1:00 PM; may be able to d/c NG this afternoon and start clears -  mobilize/IS  Crohn's - on imuran and humira  Hypogonadism - topical testosterone  PMH herpes simplex - Acyclovir FEN - NPO, IVF, NGT, BMET WNL, continue PPI  VTE - Lovenox, SCD's ID - Leukocytosis resolved; Zosyn 7/7-7/12, Rocephin/Flagyl 7/13 >> (day #5); ok to discontinue antibiotics from a surgical perspective.    ADDENDUM - patient tolerated clamping trial. No residual after over 4 hours clamped. Had 2 large BMs. Will discontinue nasogastric tube and start clear liquids.  Jill Alexanders , [facility] 04/16/2017, 8:51 AM Pager: [phone] Consults: [phone] Mon-Fri 7:00 am-4:30 pm Sat-Sun 7:00 am-11:30 am

## 2017-04-16 NOTE — Progress Notes (Signed)
Patient ID: Nathan Shaw, male   DOB: October 03, 1955, 61 y.o.   MRN: [id number]  PROGRESS NOTE    Nathan Shaw  MWU:[id number] DOB: 04-Aug-1956 DOA: 04/06/2017 PCP: Hulan Fess, MD   Brief Narrative:  61 year old male presented with abdominal cramping. Patient is known to have Crohn's disease, GERD, hypogonadism. About 4 weeks ago he had a episode of Crohn's flare, treated with 6-MP, with improvement of his symptoms. The day of admission he had acute recurrence of abdominal pain. He was recently treated for an episode of prostatitis with ciprofloxacin. 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   Patient was admitted to medical floor with working diagnosis of acute Crohn's flare complicated by mesenteric abscess. Patient was placed on broad spectrum antibiotic therapy, GI and surgery consulted. Patient underwent laparoscopic ileocecectomy and small bowel resection on 04/11/2017. Has developed post-operative ileus and has NG tube.   Assessment & Plan:   Principal Problem:   Crohn's ileitis, with sticture & abscess s/p ileocectomy 04/11/2017 Active Problems:   GERD (gastroesophageal reflux disease)   Crohn disease ([place])   1. Crohn's flare with ileitis, mesenteric abscess and fistula, with viridians Streptococcus bacteremia  - Continue Rocephin and Flagyl till at least 5 days postop as per surgery recommendations. Follow cultures no growth.  - S/pLAPAROSCOPIC ILEOCECECTOMY AND SMALL BOWEL RESECTION on 04/11/2017 - Continue Imuran. Outpatient follow-up with GI for the need for resumption of Humira. - Repeat a.m. labs  2. Post-operative ileus - Continue NG tube  - Continue IV fluids  - Follow surgery conditions - Continue ambulation  2. History of alcohol abuse.  no signs of withdrawal.  3. Hypogonadism. testosterone, topical.   4. History of herpes.  - Resume  suppressive Valtrex on discharge. Discontinued iv Acyclovir   5. Hypokalemia - Replace intravenously. Repeat a.m. labs  DVT prophylaxis:enoxaparin  Code Status:full  Family Communication:Discussed with patient and significant other present at bedside Disposition Plan:home in 1-3 days   Consultants:  Surgery  Gastroenterology   [place] on 04/11/2017   Antimicrobials:   Ceftriaxone from 04/12/2017 onwards  Metronidazole from 04/11/2017 onwards  Zosyn from 04/06/2017 to 04/11/2017  Acyclovir from 04/12/2017 to 04/15/17  Subjective: Patient seen and examined at bedside. He denies any overnight fever, nausea or vomiting.  Objective: Vitals:   04/15/17 0554 04/15/17 1405 04/15/17 2032 04/16/17 0428  BP: 115/74 119/79 122/74 101/71  Pulse: 92 (!) 110 100 95  Resp: 14 15 16 17   Temp: 97.8 F (36.6 C) 98.5 F (36.9 C) 98.4 F (36.9 C) 98.6 F (37 C)  TempSrc: Oral Oral Oral Oral  SpO2: 99% 99% 98% 96%  Weight:      Height:        Intake/Output Summary (Last 24 hours) at 04/16/17 0935 Last data filed at 04/16/17 0428  Gross per 24 hour  Intake             1020 ml  Output                0 ml  Net             1020 ml   Filed Weights   04/07/17 0115  Weight: 83.6 kg (184 lb 4.9 oz)    Examination:  General exam: Appears calm and comfortable  Respiratory system: Bilateral decreased breath sound at bases Cardiovascular system: S1 & S2 heard, Rate controlled  Gastrointestinal system: Abdomen is nondistended, soft and nontender. Bowel sounds sluggish  Extremities: No cyanosis, clubbing, edema     Data Reviewed: I have personally reviewed following labs and imaging studies  CBC:  Recent Labs Lab 04/10/17 0533 04/11/17 0451 04/12/17 0459 04/14/17 0508 04/15/17 0500 04/16/17 0521  WBC 6.0 7.1 16.5* 12.9* 11.6* 8.9  NEUTROABS 3.5 4.5  --  10.0* 8.1* 5.9  HGB 12.7* 12.5* 11.4* 10.7*  11.0* 10.0*  HCT 39.0 38.1* 34.8* 32.4* 33.2* 30.7*  MCV 83.7 83.7 82.1 84.4 83.8 84.3  PLT 245 268 344 318 340 623   Basic Metabolic Panel:  Recent Labs Lab 04/11/17 0451 04/12/17 0459 04/13/17 0514 04/14/17 0508 04/14/17 0534 04/15/17 0500 04/16/17 0521  NA 140 139  --  141  --  140 140  K 3.2* 3.5  --  3.1*  --  3.6 3.2*  CL 108 104  --  104  --  104 105  CO2 25 27  --  28  --  26 27  GLUCOSE 95 151*  --  128*  --  114* 108*  BUN <5* <5*  --  <5*  --  <5* 7  CREATININE 0.85 0.83 0.76 0.67  --  0.70 0.79  CALCIUM 8.4* 7.9*  --  8.2*  --  8.1* 8.3*  MG  --   --   --   --  1.8  --  2.0   GFR: Estimated Creatinine Clearance: 107.2 mL/min (by C-G formula based on SCr of 0.79 mg/dL). Liver Function Tests: No results for input(s): AST, ALT, ALKPHOS, BILITOT, PROT, ALBUMIN in the last 168 hours. No results for input(s): LIPASE, AMYLASE in the last 168 hours. No results for input(s): AMMONIA in the last 168 hours. Coagulation Profile: No results for input(s): INR, PROTIME in the last 168 hours. Cardiac Enzymes: No results for input(s): CKTOTAL, CKMB, CKMBINDEX, TROPONINI in the last 168 hours. BNP (last 3 results) No results for input(s): PROBNP in the last 8760 hours. HbA1C: No results for input(s): HGBA1C in the last 72 hours. CBG:  Recent Labs Lab 04/12/17 0738 04/13/17 0729 04/14/17 0738 04/15/17 0741 04/16/17 0731  GLUCAP 125* 144* 106* 124* 125*   Lipid Profile: No results for input(s): CHOL, HDL, LDLCALC, TRIG, CHOLHDL, LDLDIRECT in the last 72 hours. Thyroid Function Tests: No results for input(s): TSH, T4TOTAL, FREET4, T3FREE, THYROIDAB in the last 72 hours. Anemia Panel: No results for input(s): VITAMINB12, FOLATE, FERRITIN, TIBC, IRON, RETICCTPCT in the last 72 hours. Sepsis Labs: No results for input(s): PROCALCITON, LATICACIDVEN in the last 168 hours.  Recent Results (from the past 240 hour(s))  Culture, blood (routine x 2)     Status: Abnormal    Collection Time: 04/07/17  1:00 AM  Result Value Ref Range Status   Specimen Description BLOOD BLOOD LEFT HAND  Final   Special Requests   Final    BOTTLES DRAWN AEROBIC ONLY Blood Culture adequate volume   Culture  Setup Time   Final    GRAM POSITIVE COCCI IN CHAINS AEROBIC BOTTLE ONLY CRITICAL RESULT CALLED TO, READ BACK BY AND VERIFIED WITH: M LILLISTON,PHARMD AT 1023 04/08/17 BY L BENFIELD    Culture (A)  Final    STREPTOCOCCUS PARASANGUINIS THE SIGNIFICANCE OF ISOLATING THIS ORGANISM FROM A SINGLE SET OF BLOOD CULTURES WHEN MULTIPLE SETS ARE DRAWN IS UNCERTAIN. PLEASE NOTIFY THE MICROBIOLOGY DEPARTMENT WITHIN ONE WEEK IF SPECIATION AND SENSITIVITIES ARE REQUIRED. Performed at [facility], [place],  [place] [address]    Report Status 04/10/2017 FINAL  Final  Culture, blood (routine x 2)     Status: Abnormal   Collection Time: 04/07/17  1:00 AM  Result Value Ref Range Status   Specimen Description BLOOD RIGHT ANTECUBITAL  Final   Special Requests   Final    BOTTLES DRAWN AEROBIC ONLY Blood Culture adequate volume   Culture  Setup Time   Final    GRAM POSITIVE COCCI IN CHAINS AEROBIC BOTTLE ONLY CRITICAL RESULT CALLED TO, READ BACK BY AND VERIFIED WITH: M LILLISTON,PHARMD AT 1023 04/08/17 BY L BENFIELD    Culture (A)  Final    STREPTOCOCCUS CRISTATUS THE SIGNIFICANCE OF ISOLATING THIS ORGANISM FROM A SINGLE SET OF BLOOD CULTURES WHEN MULTIPLE SETS ARE DRAWN IS UNCERTAIN. PLEASE NOTIFY THE MICROBIOLOGY DEPARTMENT WITHIN ONE WEEK IF SPECIATION AND SENSITIVITIES ARE REQUIRED. Performed at [facility], [place] [address] [place], [place] [address]    Report Status 04/10/2017 FINAL  Final  Blood Culture ID Panel (Reflexed)     Status: Abnormal   Collection Time: 04/07/17  1:00 AM  Result Value Ref Range Status   Enterococcus species NOT DETECTED NOT DETECTED Final   Listeria monocytogenes NOT DETECTED NOT DETECTED Final   Staphylococcus species NOT DETECTED  NOT DETECTED Final   Staphylococcus aureus NOT DETECTED NOT DETECTED Final   Streptococcus species DETECTED (A) NOT DETECTED Final    Comment: Not Enterococcus species, Streptococcus agalactiae, Streptococcus pyogenes, or Streptococcus pneumoniae. CRITICAL RESULT CALLED TO, READ BACK BY AND VERIFIED WITH: M LILLISTON,PHARMD AT 1023 04/08/17 BY L BENFIELD    Streptococcus agalactiae NOT DETECTED NOT DETECTED Final   Streptococcus pneumoniae NOT DETECTED NOT DETECTED Final   Streptococcus pyogenes NOT DETECTED NOT DETECTED Final   Acinetobacter baumannii NOT DETECTED NOT DETECTED Final   Enterobacteriaceae species NOT DETECTED NOT DETECTED Final   Enterobacter cloacae complex NOT DETECTED NOT DETECTED Final   Escherichia coli NOT DETECTED NOT DETECTED Final   Klebsiella oxytoca NOT DETECTED NOT DETECTED Final   Klebsiella pneumoniae NOT DETECTED NOT DETECTED Final   Proteus species NOT DETECTED NOT DETECTED Final   Serratia marcescens NOT DETECTED NOT DETECTED Final   Haemophilus influenzae NOT DETECTED NOT DETECTED Final   Neisseria meningitidis NOT DETECTED NOT DETECTED Final   Pseudomonas aeruginosa NOT DETECTED NOT DETECTED Final   Candida albicans NOT DETECTED NOT DETECTED Final   Candida glabrata NOT DETECTED NOT DETECTED Final   Candida krusei NOT DETECTED NOT DETECTED Final   Candida parapsilosis NOT DETECTED NOT DETECTED Final   Candida tropicalis NOT DETECTED NOT DETECTED Final    Comment: Performed at [facility], [place]. [address] [place], [place] [address]  Culture, blood (Routine X 2) w Reflex to ID Panel     Status: None   Collection Time: 04/09/17  3:22 PM  Result Value Ref Range Status   Specimen Description BLOOD RIGHT HAND  Final   Special Requests   Final    BOTTLES DRAWN AEROBIC AND ANAEROBIC Blood Culture adequate volume   Culture   Final    NO GROWTH 5 DAYS Performed at [facility], [place] [address], [place], [place] [address]    Report Status  04/14/2017 FINAL  Final  Culture, blood (Routine X 2) w Reflex to ID Panel     Status: None   Collection Time: 04/09/17  5:33 PM  Result Value Ref Range Status   Specimen Description BLOOD LEFT HAND  Final  Special Requests   Final    BOTTLES DRAWN AEROBIC ONLY Blood Culture adequate volume   Culture   Final    NO GROWTH 5 DAYS Performed at [facility], [place] [address], [place], [place] [address]    Report Status 04/14/2017 FINAL  Final  Surgical pcr screen     Status: None   Collection Time: 04/11/17 10:06 AM  Result Value Ref Range Status   MRSA, PCR NEGATIVE NEGATIVE Final   Staphylococcus aureus NEGATIVE NEGATIVE Final    Comment:        The Xpert SA Assay (FDA approved for NASAL specimens in patients over 98 years of age), is one component of a comprehensive surveillance program.  Test performance has been validated by [facility] for patients greater than or equal to 44 year old. It is not intended to diagnose infection nor to guide or monitor treatment.          Radiology Studies: No results found.      Scheduled Meds: . azaTHIOprine  100 mg Oral Daily  . bisacodyl  10 mg Rectal Daily  . cholecalciferol  2,000 Units Oral Daily  . enoxaparin (LOVENOX) injection  40 mg Subcutaneous QHS  . lip balm  1 application Topical BID  . loratadine  10 mg Oral Daily  . mouth rinse  15 mL Mouth Rinse q12n4p  . omega-3 acid ethyl esters  1,000 mg Oral Daily  . pantoprazole (PROTONIX) IV  40 mg Intravenous Q24H  . testosterone  5 g Transdermal Daily   Continuous Infusions: . cefTRIAXone (ROCEPHIN)  IV Stopped (04/15/17 1517)  . dextrose 5 % and 0.9% NaCl 50 mL/hr at 04/15/17 2318  . lactated ringers    . methocarbamol (ROBAXIN)  IV    . methocarbamol (ROBAXIN)  IV Stopped (04/16/17 3428)  . metronidazole Stopped (04/16/17 0414)     LOS: 10 days        Aline August, MD [facility] Pager [phone]  If 7PM-7AM, please contact  night-coverage [email or web address] Password [email or web address] 04/16/2017, 9:35 AM

## 2017-04-17 LAB — BASIC METABOLIC PANEL
Anion gap: 9 (ref 5–15)
BUN: 6 mg/dL (ref 6–20)
CO2: 25 mmol/L (ref 22–32)
Calcium: 8.2 mg/dL — ABNORMAL LOW (ref 8.9–10.3)
Chloride: 108 mmol/L (ref 101–111)
Creatinine, Ser: 0.8 mg/dL (ref 0.61–1.24)
GFR calc Af Amer: >60 mL/min (ref >60)
GFR calc non Af Amer: >60 mL/min (ref >60)
Glucose, Bld: 104 mg/dL — ABNORMAL HIGH (ref 65–99)
Potassium: 3.4 mmol/L — ABNORMAL LOW (ref 3.5–5.1)
Sodium: 142 mmol/L (ref 135–145)

## 2017-04-17 LAB — CBC WITH DIFFERENTIAL/PLATELET
Basophils Absolute: 0 10*3/uL (ref 0.0–0.1)
Basophils Relative: 1 %
Eosinophils Absolute: 0.7 10*3/uL (ref 0.0–0.7)
Eosinophils Relative: 10 %
HCT: 28.5 % — ABNORMAL LOW (ref 39.0–52.0)
Hemoglobin: 9.4 g/dL — ABNORMAL LOW (ref 13.0–17.0)
Lymphocytes Relative: 21 %
Lymphs Abs: 1.4 10*3/uL (ref 0.7–4.0)
MCH: 27.6 pg (ref 26.0–34.0)
MCHC: 33 g/dL (ref 30.0–36.0)
MCV: 83.6 fL (ref 78.0–100.0)
Monocytes Absolute: 0.6 10*3/uL (ref 0.1–1.0)
Monocytes Relative: 8 %
Neutro Abs: 4.2 10*3/uL (ref 1.7–7.7)
Neutrophils Relative %: 60 %
Platelets: 349 10*3/uL (ref 150–400)
RBC: 3.41 MIL/uL — ABNORMAL LOW (ref 4.22–5.81)
RDW: 14.1 % (ref 11.5–15.5)
WBC: 6.9 10*3/uL (ref 4.0–10.5)

## 2017-04-17 LAB — GLUCOSE, CAPILLARY: Glucose-Capillary: 114 mg/dL — ABNORMAL HIGH (ref 65–99)

## 2017-04-17 LAB — MAGNESIUM: Magnesium: 1.7 mg/dL (ref 1.7–2.4)

## 2017-04-17 MED ORDER — HYDROCODONE-ACETAMINOPHEN 5-325 MG PO TABS
1.0000 | ORAL_TABLET | ORAL | Status: DC | PRN
  Administered 2017-04-17: 2 via ORAL
  Filled 2017-04-17: qty 2

## 2017-04-17 MED ORDER — PANTOPRAZOLE SODIUM 40 MG PO TBEC
40.0000 mg | DELAYED_RELEASE_TABLET | Freq: Every day | ORAL | Status: DC
  Administered 2017-04-17: 40 mg via ORAL
  Filled 2017-04-17 (×2): qty 1

## 2017-04-17 MED ORDER — POTASSIUM CHLORIDE CRYS ER 20 MEQ PO TBCR
60.0000 meq | EXTENDED_RELEASE_TABLET | Freq: Once | ORAL | Status: AC
  Administered 2017-04-17: 60 meq via ORAL
  Filled 2017-04-17: qty 3

## 2017-04-17 NOTE — Progress Notes (Signed)
[facility] Progress Note  6 Days Post-Op  Subjective: CC:  Mild abdominal soreness. Tolerating clears Objective: Vital signs in last 24 hours: Temp:  [98.4 F (36.9 C)-98.8 F (37.1 C)] 98.8 F (37.1 C) (07/18 0516) Pulse Rate:  [76-89] 76 (07/18 0516) Resp:  [16-18] 16 (07/18 0516) BP: (108-113)/(69-76) 113/69 (07/18 0516) SpO2:  [96 %-99 %] 96 % (07/18 0516) Last BM Date: 04/17/17  Intake/Output from previous day: 07/17 0701 - 07/18 0700 In: 3119.2 [I.V.:2159.2; IV Piggyback:960] Out: 600 [Emesis/NG output:600] Intake/Output this shift: No intake/output data recorded.  PE: Gen:  Alert, NAD, pleasant Pulm:  Normal effort Abd: Soft, appropriately tender, mild distention, incision c/d/i             Skin: warm and dry, no rashes  Psych: A&Ox3   Lab Results:   Recent Labs  04/16/17 0521 04/17/17 0508  WBC 8.9 6.9  HGB 10.0* 9.4*  HCT 30.7* 28.5*  PLT 375 349   BMET  Recent Labs  04/16/17 0521 04/17/17 0508  NA 140 142  K 3.2* 3.4*  CL 105 108  CO2 27 25  GLUCOSE 108* 104*  BUN 7 6  CREATININE 0.79 0.80  CALCIUM 8.3* 8.2*   PT/INR No results for input(s): LABPROT, INR in the last 72 hours. CMP     Component Value Date/Time   NA 142 04/17/2017 0508   K 3.4 (L) 04/17/2017 0508   CL 108 04/17/2017 0508   CO2 25 04/17/2017 0508   GLUCOSE 104 (H) 04/17/2017 0508   BUN 6 04/17/2017 0508   CREATININE 0.80 04/17/2017 0508   CALCIUM 8.2 (L) 04/17/2017 0508   PROT 6.7 04/06/2017 1756   ALBUMIN 2.8 (L) 04/06/2017 1756   AST 24 04/06/2017 1756   ALT 24 04/06/2017 1756   ALKPHOS 118 04/06/2017 1756   BILITOT 0.5 04/06/2017 1756   GFRNONAA >60 04/17/2017 0508   GFRAA >60 04/17/2017 0508   Lipase     Component Value Date/Time   LIPASE 20 04/06/2017 1756       Studies/Results: No results found.  Anti-infectives: Anti-infectives    Start     Dose/Rate Route Frequency Ordered Stop   04/14/17 1200  metroNIDAZOLE (FLAGYL) IVPB 500  mg  Status:  Discontinued     500 mg 100 mL/hr over 60 Minutes Intravenous Every 8 hours 04/14/17 1045 04/17/17 0818   04/12/17 1200  acyclovir (ZOVIRAX) 825 mg in dextrose 5 % 150 mL IVPB  Status:  Discontinued     825 mg 166.5 mL/hr over 60 Minutes Intravenous Every 8 hours 04/12/17 0953 04/15/17 0937   04/11/17 1400  cefTRIAXone (ROCEPHIN) 2 g in dextrose 5 % 50 mL IVPB  Status:  Discontinued     2 g 100 mL/hr over 30 Minutes Intravenous Every 24 hours 04/11/17 1050 04/17/17 0818   04/11/17 1400  metroNIDAZOLE (FLAGYL) IVPB 500 mg  Status:  Discontinued     500 mg 100 mL/hr over 60 Minutes Intravenous Every 8 hours 04/11/17 1050 04/13/17 1429   04/11/17 1215  piperacillin-tazobactam (ZOSYN) IVPB 3.375 g     3.375 g 100 mL/hr over 30 Minutes Intravenous  Once 04/11/17 1205 04/11/17 1301   04/11/17 1155  piperacillin-tazobactam (ZOSYN) 3.375 GM/50ML IVPB    Comments:  Whitlow, Cheryl   : cabinet override      04/11/17 1155 04/11/17 1201   04/07/17 1000  valACYclovir (VALTREX) tablet 500 mg  Status:  Discontinued     500 mg Oral Daily 04/06/17  2333 04/12/17 1002   04/07/17 0600  piperacillin-tazobactam (ZOSYN) IVPB 3.375 g  Status:  Discontinued     3.375 g 12.5 mL/hr over 240 Minutes Intravenous Every 8 hours 04/06/17 2353 04/11/17 1050   04/06/17 2115  piperacillin-tazobactam (ZOSYN) IVPB 3.375 g     3.375 g 100 mL/hr over 30 Minutes Intravenous  Once 04/06/17 2110 04/06/17 2220       Assessment/Plan Crohn's ileitis w/ small bowel stricture and mesenteric abscess POD#6 S/P LAPAROSCOPIC ILEOCECECTOMY AND SMALL BOWEL RESECTION 04/11/17. Dr. Leighton Ruff - afebrile, VSS - ileus improving, minimal flatus, +BM  - NGT clamping trial this morning, check residuals at 1:00 PM; may be able to d/c NG this afternoon and start clears - mobilize/IS  Crohn's - on imuran and humira  Hypogonadism - topical testosterone  PMH herpes simplex - Acyclovir FEN - advance as tolerated, SL IVF,  BMET WNL, continue PPI  VTE - Lovenox, SCD's ID - Leukocytosis resolved; Zosyn 7/7-7/12, Rocephin/Flagyl 7/13 >> (day #5); ok to discontinue antibiotics from a surgical perspective.      Rosario Adie, MD  [facility]

## 2017-04-17 NOTE — Progress Notes (Signed)
Patient ID: Nathan Shaw, male   DOB: Jul 24, 1956, 61 y.o.   MRN: [id number]  PROGRESS NOTE    Rochelle Wenzlick  PPJ:[id number] DOB: [birth year] DOA: 04/06/2017 PCP: Hulan Fess, MD   Brief Narrative:  61 year old male presented with abdominal cramping. Patient is known to have Crohn's disease, GERD, hypogonadism. About 4 weeks ago he had a episode of Crohn's flare, treated with 6-MP, with improvement of his symptoms. The day of admission he had acute recurrence of abdominal pain. He was recently treated for an episode of prostatitis with ciprofloxacin. 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   Patient was admitted to medical floor with working diagnosis of acute Crohn's flare complicated by mesenteric abscess. Patient was placed on broad spectrum antibiotic therapy, GI and surgery consulted. Patient underwent laparoscopic ileocecectomy and small bowel resectionon 04/11/2017. Has developed post-operative ileus and has NG tube.   Assessment & Plan:   Principal Problem:   Crohn's ileitis, with sticture & abscess s/p ileocectomy 04/11/2017 Active Problems:   GERD (gastroesophageal reflux disease)   Crohn disease ([place])   1. Crohn's flare with ileitis, mesenteric abscessand fistula, with viridians Streptococcusbacteremia  - Status post Rocephin and Flagyl for 5 days postop. DC antibiotics today. Follow cultures no growth.  - S/pLAPAROSCOPIC ILEOCECECTOMY AND SMALL BOWEL RESECTION on 04/11/2017 - Continue Imuran. Outpatient follow-up with GI for the need for resumption of Humira. - Repeat a.m. labs  2.Post-operative ileus - NG tube has been removed by general surgery. Continue diet as per general surgery recommendations and advance as tolerated  - Continue ambulation  2. History of alcohol abuse. no signs of withdrawal.  3. Hypogonadism. testosterone, topical.   4.  History of herpes.  - Resume suppressive Valtrex on discharge. Discontinued iv Acyclovir   5. Hypokalemia - Replace orally. Repeat a.m. labs  DVT prophylaxis:enoxaparin  Code Status:full  Family Communication:Discussed with patient and significant other present at bedside Disposition Plan:home in 1- 2 days   Consultants:  Surgery  Gastroenterology   [place] on 04/11/2017   Antimicrobials:   Ceftriaxone from 04/12/2017 onwards  Metronidazolefrom 04/11/2017 onwards  Zosyn from 04/06/2017 to07/08/2017  Acyclovir from 04/12/2017 to 04/15/17  Subjective: Patient seen and examined at bedside. He feels better and is having bowel movements. No overnight fever, nausea or vomiting. NG tube has been removed  Objective: Vitals:   04/16/17 0428 04/16/17 1516 04/16/17 2119 04/17/17 0516  BP: 101/71 109/74 108/76 113/69  Pulse: 95 89 80 76  Resp: 17 16 18 16   Temp: 98.6 F (37 C) 98.4 F (36.9 C) 98.7 F (37.1 C) 98.8 F (37.1 C)  TempSrc: Oral Oral Oral Oral  SpO2: 96% 99% 99% 96%  Weight:      Height:        Intake/Output Summary (Last 24 hours) at 04/17/17 1330 Last data filed at 04/17/17 1021  Gross per 24 hour  Intake          3359.17 ml  Output                0 ml  Net          3359.17 ml   Filed Weights   04/07/17 0115  Weight: 83.6 kg (184 lb 4.9 oz)    Examination:  General exam: Appears calm and comfortable  Respiratory system: Bilateral decreased breath sound at bases Cardiovascular system: S1 & S2 heard,  Rate controlled  Gastrointestinal system: Abdomen is nondistended, soft and nontender. Bowel sounds positive  Extremities: No cyanosis, clubbing, edema    Data Reviewed: I have personally reviewed following labs and imaging studies  CBC:  Recent Labs Lab 04/11/17 0451 04/12/17 0459 04/14/17 0508 04/15/17 0500 04/16/17 0521 04/17/17 0508  WBC 7.1 16.5* 12.9* 11.6* 8.9  6.9  NEUTROABS 4.5  --  10.0* 8.1* 5.9 4.2  HGB 12.5* 11.4* 10.7* 11.0* 10.0* 9.4*  HCT 38.1* 34.8* 32.4* 33.2* 30.7* 28.5*  MCV 83.7 82.1 84.4 83.8 84.3 83.6  PLT 268 344 318 340 375 338   Basic Metabolic Panel:  Recent Labs Lab 04/12/17 0459 04/13/17 0514 04/14/17 0508 04/14/17 0534 04/15/17 0500 04/16/17 0521 04/17/17 0508  NA 139  --  141  --  140 140 142  K 3.5  --  3.1*  --  3.6 3.2* 3.4*  CL 104  --  104  --  104 105 108  CO2 27  --  28  --  26 27 25   GLUCOSE 151*  --  128*  --  114* 108* 104*  BUN <5*  --  <5*  --  <5* 7 6  CREATININE 0.83 0.76 0.67  --  0.70 0.79 0.80  CALCIUM 7.9*  --  8.2*  --  8.1* 8.3* 8.2*  MG  --   --   --  1.8  --  2.0 1.7   GFR: Estimated Creatinine Clearance: 107.2 mL/min (by C-G formula based on SCr of 0.8 mg/dL). Liver Function Tests: No results for input(s): AST, ALT, ALKPHOS, BILITOT, PROT, ALBUMIN in the last 168 hours. No results for input(s): LIPASE, AMYLASE in the last 168 hours. No results for input(s): AMMONIA in the last 168 hours. Coagulation Profile: No results for input(s): INR, PROTIME in the last 168 hours. Cardiac Enzymes: No results for input(s): CKTOTAL, CKMB, CKMBINDEX, TROPONINI in the last 168 hours. BNP (last 3 results) No results for input(s): PROBNP in the last 8760 hours. HbA1C: No results for input(s): HGBA1C in the last 72 hours. CBG:  Recent Labs Lab 04/13/17 0729 04/14/17 0738 04/15/17 0741 04/16/17 0731 04/17/17 0728  GLUCAP 144* 106* 124* 125* 114*   Lipid Profile: No results for input(s): CHOL, HDL, LDLCALC, TRIG, CHOLHDL, LDLDIRECT in the last 72 hours. Thyroid Function Tests: No results for input(s): TSH, T4TOTAL, FREET4, T3FREE, THYROIDAB in the last 72 hours. Anemia Panel: No results for input(s): VITAMINB12, FOLATE, FERRITIN, TIBC, IRON, RETICCTPCT in the last 72 hours. Sepsis Labs: No results for input(s): PROCALCITON, LATICACIDVEN in the last 168 hours.  Recent Results (from the  past 240 hour(s))  Culture, blood (Routine X 2) w Reflex to ID Panel     Status: None   Collection Time: 04/09/17  3:22 PM  Result Value Ref Range Status   Specimen Description BLOOD RIGHT HAND  Final   Special Requests   Final    BOTTLES DRAWN AEROBIC AND ANAEROBIC Blood Culture adequate volume   Culture   Final    NO GROWTH 5 DAYS Performed at [facility], [address], [place], [place] [address]    Report Status 04/14/2017 FINAL  Final  Culture, blood (Routine X 2) w Reflex to ID Panel     Status: None   Collection Time: 04/09/17  5:33 PM  Result Value Ref Range Status   Specimen Description BLOOD LEFT HAND  Final   Special Requests   Final    BOTTLES DRAWN AEROBIC ONLY Blood  Culture adequate volume   Culture   Final    NO GROWTH 5 DAYS Performed at [facility], [place] [address], [place], [place] [address]    Report Status 04/14/2017 FINAL  Final  Surgical pcr screen     Status: None   Collection Time: 04/11/17 10:06 AM  Result Value Ref Range Status   MRSA, PCR NEGATIVE NEGATIVE Final   Staphylococcus aureus NEGATIVE NEGATIVE Final    Comment:        The Xpert SA Assay (FDA approved for NASAL specimens in patients over 12 years of age), is one component of a comprehensive surveillance program.  Test performance has been validated by [facility] for patients greater than or equal to 29 year old. It is not intended to diagnose infection nor to guide or monitor treatment.          Radiology Studies: No results found.      Scheduled Meds: . azaTHIOprine  100 mg Oral Daily  . bisacodyl  10 mg Rectal Daily  . cholecalciferol  2,000 Units Oral Daily  . enoxaparin (LOVENOX) injection  40 mg Subcutaneous QHS  . lip balm  1 application Topical BID  . loratadine  10 mg Oral Daily  . mouth rinse  15 mL Mouth Rinse q12n4p  . omega-3 acid ethyl esters  1,000 mg Oral Daily  . pantoprazole  40 mg Oral Daily  . testosterone  5 g Transdermal Daily    Continuous Infusions: . methocarbamol (ROBAXIN)  IV    . methocarbamol (ROBAXIN)  IV Stopped (04/17/17 0813)     LOS: 11 days        Aline August, MD [facility] Pager [phone]  If 7PM-7AM, please contact night-coverage [email or web address] Password [email or web address] 04/17/2017, 1:30 PM

## 2017-04-18 LAB — MAGNESIUM: Magnesium: 1.7 mg/dL (ref 1.7–2.4)

## 2017-04-18 LAB — BASIC METABOLIC PANEL
Anion gap: 7 (ref 5–15)
BUN: 5 mg/dL — ABNORMAL LOW (ref 6–20)
CO2: 27 mmol/L (ref 22–32)
Calcium: 8.4 mg/dL — ABNORMAL LOW (ref 8.9–10.3)
Chloride: 108 mmol/L (ref 101–111)
Creatinine, Ser: 0.87 mg/dL (ref 0.61–1.24)
GFR calc Af Amer: >60 mL/min (ref >60)
GFR calc non Af Amer: >60 mL/min (ref >60)
Glucose, Bld: 102 mg/dL — ABNORMAL HIGH (ref 65–99)
Potassium: 3.8 mmol/L (ref 3.5–5.1)
Sodium: 142 mmol/L (ref 135–145)

## 2017-04-18 LAB — GLUCOSE, CAPILLARY: Glucose-Capillary: 94 mg/dL (ref 65–99)

## 2017-04-18 MED ORDER — METHOCARBAMOL 500 MG PO TABS: 500.0000 mg | ORAL_TABLET | Freq: Four times a day (QID) | ORAL | 0 refills | Status: DC | PRN

## 2017-04-18 MED ORDER — HYDROCODONE-ACETAMINOPHEN 5-325 MG PO TABS: 1.0000 | ORAL_TABLET | Freq: Four times a day (QID) | ORAL | 0 refills | Status: DC | PRN

## 2017-04-18 MED ORDER — BISACODYL 5 MG PO TBEC: 5.0000 mg | DELAYED_RELEASE_TABLET | Freq: Every day | ORAL | 0 refills | Status: DC | PRN

## 2017-04-18 MED ORDER — ONDANSETRON HCL 4 MG PO TABS: 4.0000 mg | ORAL_TABLET | Freq: Four times a day (QID) | ORAL | 0 refills | Status: DC | PRN

## 2017-04-18 NOTE — Discharge Summary (Signed)
Physician Discharge Summary  Pasco Marchitto QBH:[id number] DOB: 07-08-1956 DOA: 04/06/2017  PCP: Hulan Fess, MD  Admit date: 04/06/2017 Discharge date: 04/18/2017  Admitted From: Home Disposition:  Home  Recommendations for Outpatient Follow-up:  1. Follow up with PCP in 1week 2. Follow-up with surgery/Dr. Marcello Moores as scheduled. Wound care as per her recommendations. 3. Follow-up with Dr. Therisa Doyne in a week or as scheduled   Home Health: No  Equipment/Devices: None  Discharge Condition: Stable CODE STATUS: Full  Diet recommendation: Heart Healthy / soft diet   Brief/Interim Summary: 61 year old male presented with abdominal cramping. Patient is known to have Crohn's disease, GERD, hypogonadism. About 4 weeks ago he had a episode of Crohn's flare, treated with 6-MP, with improvement of his symptoms. The day of admission he had acute recurrence of abdominal pain. He was recently treated for an episode of prostatitis with ciprofloxacin. 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   Patient was admitted to medical floor with working diagnosis of acute Crohn's flare complicated by mesenteric abscess. Patient was placed on broad spectrum antibiotic therapy, GI and surgery consulted. Patient underwent laparoscopic ileocecectomy and small bowel resectionon 04/11/2017. Has developed post-operative ileus and had NG tube. NG tube has been removed, he is tolerating diet and having bowel movements. He's completed antibiotic treatment. Surgery has cleared the patient for discharge   Discharge Diagnoses:  Principal Problem:   Crohn's ileitis, with sticture & abscess s/p ileocectomy 04/11/2017 Active Problems:   GERD (gastroesophageal reflux disease)   Crohn disease ([place])   1. Crohn's flare with ileitis, mesenteric abscessand fistula, with viridians Streptococcusbacteremia  - Status post  Zosyn initially which was switched to Rocephin and Flagyl. Rocephin and Flagyl were discontinued after  5 days postop as per general surgery recommendations. Follow cultures no growth. No need for more antibiotics - S/pLAPAROSCOPIC ILEOCECECTOMY AND SMALL BOWEL RESECTION on 04/11/2017. Wound care as per general surgery recommendations. Outpatient follow-up with surgery as scheduled - Continue Imuran. Outpatient follow-up with GI at earliest convenience for the need for resumption of Humira.  2.Post-operative ileus - Status post NG tube which has been removed by general surgery. Patient is tolerating diet and having bowel movements.  -  2. History of alcohol abuse. no signs of withdrawal.  3. Hypogonadism. testosterone, topical.   4. History of herpes.  - Resume suppressive Valtrex on discharge. Discontinued iv Acyclovir   5. Hypokalemia Resolved   Discharge Instructions  Discharge Instructions    Call MD for:  difficulty breathing, headache or visual disturbances    Complete by:  As directed    Call MD for:  hives    Complete by:  As directed    Call MD for:  persistant dizziness or light-headedness    Complete by:  As directed    Call MD for:  persistant nausea and vomiting    Complete by:  As directed    Call MD for:  redness, tenderness, or signs of infection (pain, swelling, redness, odor or green/yellow discharge around incision site)    Complete by:  As directed    Call MD for:  severe uncontrolled pain    Complete by:  As directed    Call MD for:  temperature >100.4    Complete by:  As directed    Diet - low sodium heart healthy    Complete by:  As directed    Discharge instructions  Complete by:  As directed    Wound care as per Surgery recommendations Soft Diet   Increase activity slowly    Complete by:  As directed      Allergies as of 04/18/2017   No Known Allergies     Medication List    STOP taking these medications   ciprofloxacin 500 MG  tablet Commonly known as:  CIPRO   ibuprofen 200 MG tablet Commonly known as:  ADVIL,MOTRIN   metroNIDAZOLE 500 MG tablet Commonly known as:  FLAGYL   ondansetron 4 MG disintegrating tablet Commonly known as:  ZOFRAN-ODT   saccharomyces boulardii 250 MG capsule Commonly known as:  FLORASTOR   UNABLE TO FIND     TAKE these medications   aspirin EC 81 MG tablet Take 81 mg by mouth daily.   AZASAN 100 MG tablet Generic drug:  azathioprine Take 100 mg by mouth daily.   bisacodyl 5 MG EC tablet Commonly known as:  DULCOLAX Take 1 tablet (5 mg total) by mouth daily as needed for moderate constipation.   cetirizine 10 MG tablet Commonly known as:  ZYRTEC Take 10 mg by mouth daily.   CYANOCOBALAMIN PO Take 1 tablet by mouth daily. Strength unknown   fluticasone 50 MCG/ACT nasal spray Commonly known as:  FLONASE Place 1 spray into both nostrils daily.   GLUCOS-CHONDROIT-MSM COMPLEX PO Take 1 tablet by mouth daily.   HUMIRA PEN 40 MG/0.8ML Pnkt Generic drug:  Adalimumab Inject 40 mg into the skin every 14 (fourteen) days.   HYDROcodone-acetaminophen 5-325 MG tablet Commonly known as:  NORCO/VICODIN Take 1 tablet by mouth every 6 (six) hours as needed for moderate pain or severe pain. What changed:  how much to take   methocarbamol 500 MG tablet Commonly known as:  ROBAXIN Take 1 tablet (500 mg total) by mouth every 6 (six) hours as needed for muscle spasms.   multivitamin with minerals Tabs tablet Take 1 tablet by mouth daily.   Omega 3 1200 MG Caps Take 1,200 mg by mouth daily.   omeprazole 20 MG capsule Commonly known as:  PRILOSEC Take 20 mg by mouth daily.   ondansetron 4 MG tablet Commonly known as:  ZOFRAN Take 1 tablet (4 mg total) by mouth every 6 (six) hours as needed for nausea.   sildenafil 20 MG tablet Commonly known as:  REVATIO Take 20 mg by mouth daily as needed (erectile dysfunction).   testosterone 50 MG/5GM (1%) Gel Commonly known  as:  ANDROGEL Place 5 g onto the skin daily.   valACYclovir 500 MG tablet Commonly known as:  VALTREX Take 500 mg by mouth daily.   Vitamin D 2000 units Caps Take 2,000 Units by mouth daily.      Follow-up Information    Leighton Ruff, MD. Go on 6/94/8546.   Specialty:  General Surgery Why:  Your appointment is 04/29/17 at 9:30AM. Please arrive 30 minutes prior to your appointment to check in and fill out necessary paperwork. Contact information: [place] [address] [phone]        Ronnette Juniper, MD Follow up in 1 week(s).   Specialty:  Gastroenterology Contact information: [place] [address] [phone]        Hulan Fess, MD Follow up in 1 week(s).   Specialty:  Family Medicine Contact information: [place] [address] [phone]          No Known Allergies  Consultations:  General surgery and GI   Procedures/Studies: Ct Abdomen Pelvis W Contrast  Result Date: 04/10/2017 CLINICAL DATA:  Abdominal cramping Crohn's disease EXAM: CT ABDOMEN AND PELVIS WITH CONTRAST TECHNIQUE: Multidetector CT imaging of the abdomen and pelvis was performed using the standard protocol following bolus administration of intravenous contrast. CONTRAST:  41mL ISOVUE-300 IOPAMIDOL (ISOVUE-300) INJECTION 61%, 124mL ISOVUE-300 IOPAMIDOL (ISOVUE-300) INJECTION 61% COMPARISON:  04/06/2017, 03/19/2017 FINDINGS: Lower chest: Lung bases demonstrate no acute consolidation or pleural effusion. Nonenlarged heart. Hepatobiliary: Hepatic steatosis. No calcified gallstones or biliary dilatation. subcentimeter hypodensity in the dome of the liver. Pancreas: Unremarkable. No pancreatic ductal dilatation or surrounding inflammatory changes. Spleen: Normal in size without focal abnormality. Adrenals/Urinary Tract: Adrenal glands are unremarkable. Kidneys are normal, without renal calculi, focal lesion, or hydronephrosis.  Bladder is unremarkable. Stomach/Bowel: Stomach is nonenlarged. Prominent submucosal fat in the duodenum as noted previously, possibly related to prior bouts of inflammation. Long segment small bowel thickening of the distal ileum in the right lower quadrant and upper pelvis consistent with ileitis with moderate residual surrounding inflammation in the mesenteries he no overall decreased compared to the most recent prior CT. 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 This is contiguous with gas and fluid collection in the right lower quadrant mesentery measuring 3.9 x 1.9 cm consistent with abscess, and decreased compared to previous measurements of 4.9 x 4.7 by 3.4 cm. Contrast migrates beyond the thickens loop of ileum and there is contrast present within the colon. Additional thickened mid ileal bowel loop, series 2, image number 30. Vascular/Lymphatic: Multiple right lower quadrant lymph nodes. Atherosclerotic vascular disease of the aorta. Reproductive: Prostate is unremarkable. Other: No free air. Small free fluid in the pelvis and lower abdomen. Musculoskeletal: No acute or suspicious bone lesion. Stable wedging of L2. IMPRESSION: 1. Findings consistent with distal ileitis with overall decreased inflammation surrounding the thickened small bowel loops in the right lower quadrant. 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 Decreasing inflammation in the right lower quadrant mesentery compared to prior. 2. Small amount of ascites in the abdomen and pelvis, increased compared to prior Electronically Signed   By: Donavan Foil M.D.   On: 04/10/2017 22:31   Ct Abdomen Pelvis W Contrast  Result Date: 04/06/2017 CLINICAL DATA:  Crohn's disease with abdominal pain EXAM: CT ABDOMEN AND PELVIS WITH CONTRAST TECHNIQUE: Multidetector CT  imaging of the abdomen and pelvis was performed using the standard protocol following bolus administration of intravenous contrast. Oral contrast was also administered. CONTRAST:  <See Chart> ISOVUE-300 IOPAMIDOL (ISOVUE-300) INJECTION 61% COMPARISON:  March 19, 2017 as well as multiple prior studies FINDINGS: Lower chest: On axial slice 6 series 6, there is a stable 2 mm nodular opacity in the posterior segment of the left lobe of the liver. Lung bases otherwise are clear. Hepatobiliary: No focal liver lesions are evident. Gallbladder wall is not appreciably thickened. There is no biliary duct dilatation. Pancreas: No pancreatic mass or inflammatory focus. Spleen: Spleen is upper normal in size. No splenic lesions are evident. Adrenals/Urinary Tract: Adrenals appear normal bilaterally. There is a 7 mm cyst in the mid left kidney. There is no hydronephrosis on either side. There is no renal or ureteral calculus on either side. Urinary bladder is midline with wall thickness within normal limits. Stomach/Bowel: There is bowel wall thickening throughout most  of the ileum including the terminal ileum consistent with known Crohn's disease. There is a fistula between the distal ileum and mesentery in the right upper to mid pelvic region with focal areas of air consistent with mesenteric abscess in this area measuring 4.9 x 4.7 x 3.4 cm. This mesenteric abscess is larger than on recent prior study. No other abscess is seen. There is mesenteric stranding along much of the ileum, particularly in the right abdomen. There is mild loculated fluid in this area. There is enhancement of bowel wall in the mid to distal ileum, consistent with acute inflammation. In the proximal ileal region, there is localized bowel dilatation with attempted intussusception. Oral contrast does pass through this area. More proximally, fat is noted in the wall of the duodenum. No bowel inflammation is noted proximal to the ileum. There is no frank  bowel obstruction. There is no free air or portal venous air beyond the air which is seen within the area of the mesenteric abscess at the site of the fistula with a distal small bowel. Vascular/Lymphatic: There is atherosclerotic calcification in the aorta and iliac arteries. No evident aneurysm. Major mesenteric vessels appear patent. No adenopathy evident in the abdomen or pelvis. Reproductive: Prostate and seminal vesicles appear normal in size and contour. No pelvic mass beyond bowel inflammation. Other: There is mild loculated ascites in the dependent portion of the pelvis. Appendix not seen. Appendix may well be involved with inflammation from the terminal ileitis and nearby mesenteric abscess. Musculoskeletal: There is chronic anterior wedging of the L2 vertebral body. There are no blastic or lytic bone lesions. There is symmetric sacroiliitis consistent with the Crohn's disease. No blastic or lytic bone lesions. No intramuscular or abdominal wall lesion evident. IMPRESSION: 1. Extensive ileitis. 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 There is considerably more inflammation in the right lower quadrant involvement of the mid and distal ileum than on recent prior study. Attempted intussusception more proximally in the ileum without obstruction. 2. Fat in the duodenum wall, likely due to an active residua of Crohn's disease. 3. No inflammatory change noted in bowel proximal to the ileum currently. 4. No renal or ureteral calculus. No hydronephrosis. No biliary duct dilatation. 5.  Stable symmetric sacroiliitis consistent with Crohn's disease. 6.  Aortic atherosclerosis. 7. 2 mm nodular opacity posterior left base. No follow-up needed if patient is low-risk. Non-contrast chest CT can be considered in 12 months if patient is high-risk. This recommendation follows the consensus statement: Guidelines for  Management of Incidental Pulmonary Nodules Detected on CT Images: From the Fleischner Society 2017; Radiology 2017; 284:228-243. Aortic Atherosclerosis (ICD10-I70.0). Electronically Signed   By: Lowella Grip III M.D.   On: 04/06/2017 20:27   Ct Entero Abd/pelvis W Contast  Result Date: 03/19/2017 CLINICAL DATA:  Increasing abdominal cramping with vomiting and fever. Recently started antibiotics. History of Crohn's disease with bowel obstruction. EXAM: CT ABDOMEN AND PELVIS WITH CONTRAST (ENTEROGRAPHY) TECHNIQUE: Multidetector CT of the abdomen and pelvis during bolus administration of intravenous contrast. Negative oral contrast was given. CONTRAST:  184mL ISOVUE-300 IOPAMIDOL (ISOVUE-300) INJECTION 61% COMPARISON:  CT 12/22/2015 and 12/05/2015.  Radiographs 03/16/2017. FINDINGS: Lower chest: Clear lung bases. No significant pleural or pericardial effusion. Hepatobiliary: Stable probable tiny subcapsular cyst in the dome of the right hepatic lobe, best seen on images 17 and 18 of series 3. No suspicious hepatic findings. No evidence of gallstones,  gallbladder wall thickening or biliary dilatation. Pancreas: Unremarkable. No pancreatic ductal dilatation or surrounding inflammatory changes. Spleen: Normal in size without focal abnormality. Adrenals/Urinary Tract: Both adrenal glands appear normal. Stable ill-defined low-density lesions in both kidneys, too small to characterize. No evidence of urinary tract calculus or hydronephrosis. The bladder appears normal. Stomach/Bowel: The stomach and proximal small bowel demonstrate no acute findings. There is mild fat deposition within the duodenal wall. Again demonstrated is long segment inflammation of the terminal ileum with wall thickening, mucosal hyper enhancement and surrounding inflammation. The overall extent of this bowel inflammation is similar to the prior study. However, the surrounding inflammatory changes have significantly improved, and there is no  longer any residual fluid collection. There is a tiny dot of extraluminal air anterior to the terminal ileum (image 139 of series 3). The small bowel proximal to this inflammation is dilated to 4.9 cm, consistent with partial obstruction. There are suspected skip lesions with more proximal involvement of the ileum in the right mid abdomen (coronal images 40-49), not clearly seen previously. No colonic abnormality or fistula identified. Vascular/Lymphatic: There are no enlarged abdominal or pelvic lymph nodes. Mild aortoiliac atherosclerosis. Reproductive: The prostate gland and seminal vesicles appear unremarkable. Other: No evidence of abdominal wall mass or hernia. No ascites or free air. Musculoskeletal: No acute osseous findings. Suspected mild chronic sacroiliitis bilaterally. IMPRESSION: 1. Evidence of persistent active Crohn's disease involving the terminal ileum with resulting partial small bowel obstruction. Probable skip lesion involving proximal ileum. 2. The previously demonstrated pericolonic inflammatory changes from focal perforation have significantly improved. No residual focal extraluminal fluid collection. 3.  Aortic Atherosclerosis (ICD10-I70.0). Electronically Signed   By: Richardean Sale M.D.   On: 03/19/2017 15:49    LAPAROSCOPIC ILEOCECECTOMY AND SMALL BOWEL RESECTION on 04/11/2017   Subjective: Patient seen and examined at bedside. He denies any overnight fever, nausea vomiting. He is having bowel movements. He is tolerating diet  Discharge Exam: Vitals:   04/17/17 2108 04/18/17 0515  BP: 118/77 99/64  Pulse: 86 76  Resp: 16 16  Temp: 98.6 F (37 C) 98.1 F (36.7 C)   Vitals:   04/17/17 0516 04/17/17 1449 04/17/17 2108 04/18/17 0515  BP: 113/69 102/60 118/77 99/64  Pulse: 76 77 86 76  Resp: 16 16 16 16   Temp: 98.8 F (37.1 C) 98.5 F (36.9 C) 98.6 F (37 C) 98.1 F (36.7 C)  TempSrc: Oral Oral Oral Oral  SpO2: 96% 99% 100% 97%  Weight:      Height:         General: Pt is alert, awake, not in acute distress Cardiovascular: RRR, S1/S2 + Respiratory: CTA bilaterally, no wheezing, no rhonchi Abdominal: Soft, NT, ND, bowel sounds + Extremities: no edema, no cyanosis    The results of significant diagnostics from this hospitalization (including imaging, microbiology, ancillary and laboratory) are listed below for reference.     Microbiology: Recent Results (from the past 240 hour(s))  Culture, blood (Routine X 2) w Reflex to ID Panel     Status: None   Collection Time: 04/09/17  3:22 PM  Result Value Ref Range Status   Specimen Description BLOOD RIGHT HAND  Final   Special Requests   Final    BOTTLES DRAWN AEROBIC AND ANAEROBIC Blood Culture adequate volume   Culture   Final    NO GROWTH 5 DAYS Performed at [facility], [address], [place], [place] [address]    Report Status 04/14/2017 FINAL  Final  Culture, blood (Routine X 2) w Reflex to ID Panel     Status: None   Collection Time: 04/09/17  5:33 PM  Result Value Ref Range Status   Specimen Description BLOOD LEFT HAND  Final   Special Requests   Final    BOTTLES DRAWN AEROBIC ONLY Blood Culture adequate volume   Culture   Final    NO GROWTH 5 DAYS Performed at [facility], [place] [address] [place], [place] [address]    Report Status 04/14/2017 FINAL  Final  Surgical pcr screen     Status: None   Collection Time: 04/11/17 10:06 AM  Result Value Ref Range Status   MRSA, PCR NEGATIVE NEGATIVE Final   Staphylococcus aureus NEGATIVE NEGATIVE Final    Comment:        The Xpert SA Assay (FDA approved for NASAL specimens in patients over 74 years of age), is one component of a comprehensive surveillance program.  Test performance has been validated by [facility] for patients greater than or equal to 65 year old. It is not intended to diagnose infection nor to guide or monitor treatment.      Labs: BNP (last 3 results) No results for input(s): BNP in  the last 8760 hours. Basic Metabolic Panel:  Recent Labs Lab 04/14/17 0508 04/14/17 0534 04/15/17 0500 04/16/17 0521 04/17/17 0508 04/18/17 0513  NA 141  --  140 140 142 142  K 3.1*  --  3.6 3.2* 3.4* 3.8  CL 104  --  104 105 108 108  CO2 28  --  26 27 25 27   GLUCOSE 128*  --  114* 108* 104* 102*  BUN <5*  --  <5* 7 6 5*  CREATININE 0.67  --  0.70 0.79 0.80 0.87  CALCIUM 8.2*  --  8.1* 8.3* 8.2* 8.4*  MG  --  1.8  --  2.0 1.7 1.7   Liver Function Tests: No results for input(s): AST, ALT, ALKPHOS, BILITOT, PROT, ALBUMIN in the last 168 hours. No results for input(s): LIPASE, AMYLASE in the last 168 hours. No results for input(s): AMMONIA in the last 168 hours. CBC:  Recent Labs Lab 04/12/17 0459 04/14/17 0508 04/15/17 0500 04/16/17 0521 04/17/17 0508  WBC 16.5* 12.9* 11.6* 8.9 6.9  NEUTROABS  --  10.0* 8.1* 5.9 4.2  HGB 11.4* 10.7* 11.0* 10.0* 9.4*  HCT 34.8* 32.4* 33.2* 30.7* 28.5*  MCV 82.1 84.4 83.8 84.3 83.6  PLT 344 318 340 375 349   Cardiac Enzymes: No results for input(s): CKTOTAL, CKMB, CKMBINDEX, TROPONINI in the last 168 hours. BNP: Invalid input(s): POCBNP CBG:  Recent Labs Lab 04/14/17 0738 04/15/17 0741 04/16/17 0731 04/17/17 0728 04/18/17 0749  GLUCAP 106* 124* 125* 114* 94   D-Dimer No results for input(s): DDIMER in the last 72 hours. Hgb A1c No results for input(s): HGBA1C in the last 72 hours. Lipid Profile No results for input(s): CHOL, HDL, LDLCALC, TRIG, CHOLHDL, LDLDIRECT in the last 72 hours. Thyroid function studies No results for input(s): TSH, T4TOTAL, T3FREE, THYROIDAB in the last 72 hours.  Invalid input(s): FREET3 Anemia work up No results for input(s): VITAMINB12, FOLATE, FERRITIN, TIBC, IRON, RETICCTPCT in the last 72 hours. Urinalysis    Component Value Date/Time   COLORURINE YELLOW 04/06/2017 1902   APPEARANCEUR CLEAR 04/06/2017 1902   LABSPEC 1.012 04/06/2017 1902   PHURINE 7.0 04/06/2017 1902   GLUCOSEU  NEGATIVE 04/06/2017 1902   HGBUR NEGATIVE 04/06/2017 1902   BILIRUBINUR NEGATIVE 04/06/2017  [place] 04/06/2017 1902   PROTEINUR NEGATIVE 04/06/2017 1902   NITRITE NEGATIVE 04/06/2017 1902   LEUKOCYTESUR NEGATIVE 04/06/2017 1902   Sepsis Labs Invalid input(s): PROCALCITONIN,  WBC,  LACTICIDVEN Microbiology Recent Results (from the past 240 hour(s))  Culture, blood (Routine X 2) w Reflex to ID Panel     Status: None   Collection Time: 04/09/17  3:22 PM  Result Value Ref Range Status   Specimen Description BLOOD RIGHT HAND  Final   Special Requests   Final    BOTTLES DRAWN AEROBIC AND ANAEROBIC Blood Culture adequate volume   Culture   Final    NO GROWTH 5 DAYS Performed at [facility], [place] [address], [place], [place] [address]    Report Status 04/14/2017 FINAL  Final  Culture, blood (Routine X 2) w Reflex to ID Panel     Status: None   Collection Time: 04/09/17  5:33 PM  Result Value Ref Range Status   Specimen Description BLOOD LEFT HAND  Final   Special Requests   Final    BOTTLES DRAWN AEROBIC ONLY Blood Culture adequate volume   Culture   Final    NO GROWTH 5 DAYS Performed at [facility], [place] [address], [place], [place] [address]    Report Status 04/14/2017 FINAL  Final  Surgical pcr screen     Status: None   Collection Time: 04/11/17 10:06 AM  Result Value Ref Range Status   MRSA, PCR NEGATIVE NEGATIVE Final   Staphylococcus aureus NEGATIVE NEGATIVE Final    Comment:        The Xpert SA Assay (FDA approved for NASAL specimens in patients over 58 years of age), is one component of a comprehensive surveillance program.  Test performance has been validated by [facility] for patients greater than or equal to 71 year old. It is not intended to diagnose infection nor to guide or monitor treatment.      Time coordinating discharge: 35 minutes  SIGNED:   Aline August, MD  [facility] 04/18/2017, 10:26 AM Pager:  [phone]  If 7PM-7AM, please contact night-coverage [email or web address] Password [email or web address]

## 2017-04-18 NOTE — Progress Notes (Signed)
Patient ID: Nathan Shaw, male   DOB: 12-04-1955, 61 y.o.   MRN: [id number]  [facility] Surgery Progress Note  7 Days Post-Op  Subjective: CC- no complaints Sitting up in chair. Abdomen sore but pain well controlled. Tolerating soft diet. Had 3 soft BM's yesterday. Denies n/v. States that he is ready to go home.  Objective: Vital signs in last 24 hours: Temp:  [98.1 F (36.7 C)-98.6 F (37 C)] 98.1 F (36.7 C) (07/19 0515) Pulse Rate:  [76-86] 76 (07/19 0515) Resp:  [16] 16 (07/19 0515) BP: (99-118)/(60-77) 99/64 (07/19 0515) SpO2:  [97 %-100 %] 97 % (07/19 0515) Last BM Date: 04/17/17  Intake/Output from previous day: 07/18 0701 - 07/19 0700 In: 645 [P.O.:480; IV Piggyback:165] Out: -  Intake/Output this shift: No intake/output data recorded.  PE: Gen:  Alert, NAD, pleasant HEENT: EOM's intact, pupils equal  Card:  RRR, no M/G/R heard Pulm:  effort normal Abd: Soft, NT/ND, +BS, well healing midline and multiple lap incisions Ext:  No erythema, edema, or tenderness BUE/BLE  Psych: A&Ox3  Skin: no rashes noted, warm and dry  Lab Results:   Recent Labs  04/16/17 0521 04/17/17 0508  WBC 8.9 6.9  HGB 10.0* 9.4*  HCT 30.7* 28.5*  PLT 375 349   BMET  Recent Labs  04/17/17 0508 04/18/17 0513  NA 142 142  K 3.4* 3.8  CL 108 108  CO2 25 27  GLUCOSE 104* 102*  BUN 6 5*  CREATININE 0.80 0.87  CALCIUM 8.2* 8.4*   PT/INR No results for input(s): LABPROT, INR in the last 72 hours. CMP     Component Value Date/Time   NA 142 04/18/2017 0513   K 3.8 04/18/2017 0513   CL 108 04/18/2017 0513   CO2 27 04/18/2017 0513   GLUCOSE 102 (H) 04/18/2017 0513   BUN 5 (L) 04/18/2017 0513   CREATININE 0.87 04/18/2017 0513   CALCIUM 8.4 (L) 04/18/2017 0513   PROT 6.7 04/06/2017 1756   ALBUMIN 2.8 (L) 04/06/2017 1756   AST 24 04/06/2017 1756   ALT 24 04/06/2017 1756   ALKPHOS 118 04/06/2017 1756   BILITOT 0.5 04/06/2017 1756   GFRNONAA >60 04/18/2017 0513   GFRAA >60 04/18/2017 0513   Lipase     Component Value Date/Time   LIPASE 20 04/06/2017 1756       Studies/Results: No results found.  Anti-infectives: Anti-infectives    Start     Dose/Rate Route Frequency Ordered Stop   04/14/17 1200  metroNIDAZOLE (FLAGYL) IVPB 500 mg  Status:  Discontinued     500 mg 100 mL/hr over 60 Minutes Intravenous Every 8 hours 04/14/17 1045 04/17/17 0818   04/12/17 1200  acyclovir (ZOVIRAX) 825 mg in dextrose 5 % 150 mL IVPB  Status:  Discontinued     825 mg 166.5 mL/hr over 60 Minutes Intravenous Every 8 hours 04/12/17 0953 04/15/17 0937   04/11/17 1400  cefTRIAXone (ROCEPHIN) 2 g in dextrose 5 % 50 mL IVPB  Status:  Discontinued     2 g 100 mL/hr over 30 Minutes Intravenous Every 24 hours 04/11/17 1050 04/17/17 0818   04/11/17 1400  metroNIDAZOLE (FLAGYL) IVPB 500 mg  Status:  Discontinued     500 mg 100 mL/hr over 60 Minutes Intravenous Every 8 hours 04/11/17 1050 04/13/17 1429   04/11/17 1215  piperacillin-tazobactam (ZOSYN) IVPB 3.375 g     3.375 g 100 mL/hr over 30 Minutes Intravenous  Once 04/11/17 1205 04/11/17 1301   04/11/17  1155  piperacillin-tazobactam (ZOSYN) 3.375 GM/50ML IVPB    Comments:  Whitlow, Cheryl   : cabinet override      04/11/17 1155 04/11/17 1201   04/07/17 1000  valACYclovir (VALTREX) tablet 500 mg  Status:  Discontinued     500 mg Oral Daily 04/06/17 2333 04/12/17 1002   04/07/17 0600  piperacillin-tazobactam (ZOSYN) IVPB 3.375 g  Status:  Discontinued     3.375 g 12.5 mL/hr over 240 Minutes Intravenous Every 8 hours 04/06/17 2353 04/11/17 1050   04/06/17 2115  piperacillin-tazobactam (ZOSYN) IVPB 3.375 g     3.375 g 100 mL/hr over 30 Minutes Intravenous  Once 04/06/17 2110 04/06/17 2220       Assessment/Plan Crohn's ileitis w/ small bowel stricture and mesenteric abscess S/P LAPAROSCOPIC ILEOCECECTOMY AND SMALL BOWEL RESECTION 04/11/17. Dr. Leighton Ruff - POD 7 - afebrile, VSS - ileus resolved, having bowel  function, tolerating soft diet - ready for discharge from surgical standpoint. Pain med rx on chart. Patient will follow-up with Dr. Marcello Moores in 2-3 weeks.  Crohn's - on imuran and humira  Hypogonadism - topical testosterone  PMH herpes simplex - Acyclovir FEN - soft diet, continue PPI  VTE - Lovenox, SCD's ID - Leukocytosis resolved; Zosyn 7/7-7/12, Rocephin/Flagyl 7/13 >> 7/18   LOS: 12 days    Jerrye Beavers , [facility] Surgery 04/18/2017, 9:17 AM Pager: [phone] Consults: [phone] Mon-Fri 7:00 am-4:30 pm Sat-Sun 7:00 am-11:30 am

## 2017-04-18 NOTE — Progress Notes (Signed)
Pt tolerating diet, voiding, ambulating, no complaints of pain.  D/C instructions and prescription was given and understanding was verbalized.

## 2017-04-23 DIAGNOSIS — K50013 Crohn's disease of small intestine with fistula: Secondary | ICD-10-CM | POA: Diagnosis not present

## 2017-04-25 DIAGNOSIS — E531 Pyridoxine deficiency: Secondary | ICD-10-CM | POA: Diagnosis not present

## 2017-04-25 DIAGNOSIS — K50019 Crohn's disease of small intestine with unspecified complications: Secondary | ICD-10-CM | POA: Diagnosis not present

## 2017-04-25 DIAGNOSIS — E291 Testicular hypofunction: Secondary | ICD-10-CM | POA: Diagnosis not present

## 2017-04-25 DIAGNOSIS — E559 Vitamin D deficiency, unspecified: Secondary | ICD-10-CM | POA: Diagnosis not present

## 2017-04-28 DIAGNOSIS — K659 Peritonitis, unspecified: Secondary | ICD-10-CM | POA: Diagnosis not present

## 2017-04-30 DIAGNOSIS — H2513 Age-related nuclear cataract, bilateral: Secondary | ICD-10-CM | POA: Diagnosis not present

## 2017-04-30 DIAGNOSIS — H35371 Puckering of macula, right eye: Secondary | ICD-10-CM | POA: Diagnosis not present

## 2017-04-30 DIAGNOSIS — H43393 Other vitreous opacities, bilateral: Secondary | ICD-10-CM | POA: Diagnosis not present

## 2017-04-30 DIAGNOSIS — H40013 Open angle with borderline findings, low risk, bilateral: Secondary | ICD-10-CM | POA: Diagnosis not present

## 2017-05-08 DIAGNOSIS — K50818 Crohn's disease of both small and large intestine with other complication: Secondary | ICD-10-CM | POA: Diagnosis not present

## 2017-05-15 DIAGNOSIS — H5319 Other subjective visual disturbances: Secondary | ICD-10-CM | POA: Diagnosis not present

## 2017-05-15 DIAGNOSIS — H43393 Other vitreous opacities, bilateral: Secondary | ICD-10-CM | POA: Diagnosis not present

## 2017-05-15 DIAGNOSIS — H43812 Vitreous degeneration, left eye: Secondary | ICD-10-CM | POA: Diagnosis not present

## 2017-05-22 DIAGNOSIS — D649 Anemia, unspecified: Secondary | ICD-10-CM | POA: Diagnosis not present

## 2017-05-22 DIAGNOSIS — Z23 Encounter for immunization: Secondary | ICD-10-CM | POA: Diagnosis not present

## 2017-05-22 DIAGNOSIS — E559 Vitamin D deficiency, unspecified: Secondary | ICD-10-CM | POA: Diagnosis not present

## 2017-05-22 DIAGNOSIS — E785 Hyperlipidemia, unspecified: Secondary | ICD-10-CM | POA: Diagnosis not present

## 2017-05-22 DIAGNOSIS — Z0000 Encounter for general adult medical examination without abnormal findings: Secondary | ICD-10-CM | POA: Diagnosis not present

## 2017-05-22 DIAGNOSIS — E538 Deficiency of other specified B group vitamins: Secondary | ICD-10-CM | POA: Diagnosis not present

## 2017-05-28 DIAGNOSIS — M8589 Other specified disorders of bone density and structure, multiple sites: Secondary | ICD-10-CM | POA: Diagnosis not present

## 2017-05-29 DIAGNOSIS — H43812 Vitreous degeneration, left eye: Secondary | ICD-10-CM | POA: Diagnosis not present

## 2017-05-29 DIAGNOSIS — H5319 Other subjective visual disturbances: Secondary | ICD-10-CM | POA: Diagnosis not present

## 2017-05-29 DIAGNOSIS — H43393 Other vitreous opacities, bilateral: Secondary | ICD-10-CM | POA: Diagnosis not present

## 2017-06-27 DIAGNOSIS — H43812 Vitreous degeneration, left eye: Secondary | ICD-10-CM | POA: Diagnosis not present

## 2017-06-27 DIAGNOSIS — H43393 Other vitreous opacities, bilateral: Secondary | ICD-10-CM | POA: Diagnosis not present

## 2017-06-27 DIAGNOSIS — H5319 Other subjective visual disturbances: Secondary | ICD-10-CM | POA: Diagnosis not present

## 2017-08-09 ENCOUNTER — Ambulatory Visit
Admission: RE | Admit: 2017-08-09 | Discharge: 2017-08-09 | Disposition: A | Discharge status: Home / Self Care | Payer: BLUE CROSS/BLUE SHIELD | Source: Ambulatory Visit | Attending: Family Medicine | Admitting: Family Medicine

## 2017-08-09 ENCOUNTER — Other Ambulatory Visit: Payer: Self-pay | Admitting: Family Medicine

## 2017-08-09 DIAGNOSIS — N201 Calculus of ureter: Secondary | ICD-10-CM | POA: Diagnosis not present

## 2017-08-09 DIAGNOSIS — R109 Unspecified abdominal pain: Secondary | ICD-10-CM | POA: Diagnosis not present

## 2017-08-12 DIAGNOSIS — D649 Anemia, unspecified: Secondary | ICD-10-CM | POA: Diagnosis not present

## 2017-08-12 DIAGNOSIS — K5090 Crohn's disease, unspecified, without complications: Secondary | ICD-10-CM | POA: Diagnosis not present

## 2017-08-12 DIAGNOSIS — E291 Testicular hypofunction: Secondary | ICD-10-CM | POA: Diagnosis not present

## 2017-08-14 DIAGNOSIS — N201 Calculus of ureter: Secondary | ICD-10-CM | POA: Diagnosis not present

## 2017-08-16 DIAGNOSIS — N201 Calculus of ureter: Secondary | ICD-10-CM | POA: Diagnosis not present

## 2017-09-18 DIAGNOSIS — K5080 Crohn's disease of both small and large intestine without complications: Secondary | ICD-10-CM | POA: Diagnosis not present

## 2017-09-25 DIAGNOSIS — N201 Calculus of ureter: Secondary | ICD-10-CM | POA: Diagnosis not present

## 2017-10-02 DIAGNOSIS — Z23 Encounter for immunization: Secondary | ICD-10-CM | POA: Diagnosis not present

## 2017-12-02 DIAGNOSIS — H40013 Open angle with borderline findings, low risk, bilateral: Secondary | ICD-10-CM | POA: Diagnosis not present

## 2018-02-28 DIAGNOSIS — R21 Rash and other nonspecific skin eruption: Secondary | ICD-10-CM | POA: Diagnosis not present

## 2018-02-28 DIAGNOSIS — K5090 Crohn's disease, unspecified, without complications: Secondary | ICD-10-CM | POA: Diagnosis not present

## 2018-03-05 DIAGNOSIS — L821 Other seborrheic keratosis: Secondary | ICD-10-CM | POA: Diagnosis not present

## 2018-03-05 DIAGNOSIS — L309 Dermatitis, unspecified: Secondary | ICD-10-CM | POA: Diagnosis not present

## 2018-03-24 DIAGNOSIS — R748 Abnormal levels of other serum enzymes: Secondary | ICD-10-CM | POA: Diagnosis not present

## 2018-03-28 DIAGNOSIS — K649 Unspecified hemorrhoids: Secondary | ICD-10-CM | POA: Diagnosis not present

## 2018-03-28 DIAGNOSIS — K635 Polyp of colon: Secondary | ICD-10-CM | POA: Diagnosis not present

## 2018-03-28 DIAGNOSIS — Z980 Intestinal bypass and anastomosis status: Secondary | ICD-10-CM | POA: Diagnosis not present

## 2018-03-28 DIAGNOSIS — D126 Benign neoplasm of colon, unspecified: Secondary | ICD-10-CM | POA: Diagnosis not present

## 2018-03-28 DIAGNOSIS — K5010 Crohn's disease of large intestine without complications: Secondary | ICD-10-CM | POA: Diagnosis not present

## 2018-03-28 DIAGNOSIS — K5289 Other specified noninfective gastroenteritis and colitis: Secondary | ICD-10-CM | POA: Diagnosis not present

## 2018-03-31 DIAGNOSIS — K5289 Other specified noninfective gastroenteritis and colitis: Secondary | ICD-10-CM | POA: Diagnosis not present

## 2018-03-31 DIAGNOSIS — D126 Benign neoplasm of colon, unspecified: Secondary | ICD-10-CM | POA: Diagnosis not present

## 2018-03-31 DIAGNOSIS — K635 Polyp of colon: Secondary | ICD-10-CM | POA: Diagnosis not present

## 2018-04-21 DIAGNOSIS — R748 Abnormal levels of other serum enzymes: Secondary | ICD-10-CM | POA: Diagnosis not present

## 2018-05-01 DIAGNOSIS — H43393 Other vitreous opacities, bilateral: Secondary | ICD-10-CM | POA: Diagnosis not present

## 2018-05-01 DIAGNOSIS — H43812 Vitreous degeneration, left eye: Secondary | ICD-10-CM | POA: Diagnosis not present

## 2018-05-01 DIAGNOSIS — H5319 Other subjective visual disturbances: Secondary | ICD-10-CM | POA: Diagnosis not present

## 2018-05-01 DIAGNOSIS — H40013 Open angle with borderline findings, low risk, bilateral: Secondary | ICD-10-CM | POA: Diagnosis not present

## 2018-05-01 DIAGNOSIS — H2513 Age-related nuclear cataract, bilateral: Secondary | ICD-10-CM | POA: Diagnosis not present

## 2018-07-16 DIAGNOSIS — Z0000 Encounter for general adult medical examination without abnormal findings: Secondary | ICD-10-CM | POA: Diagnosis not present

## 2018-07-16 DIAGNOSIS — M8580 Other specified disorders of bone density and structure, unspecified site: Secondary | ICD-10-CM | POA: Diagnosis not present

## 2018-07-16 DIAGNOSIS — E538 Deficiency of other specified B group vitamins: Secondary | ICD-10-CM | POA: Diagnosis not present

## 2018-07-16 DIAGNOSIS — Z23 Encounter for immunization: Secondary | ICD-10-CM | POA: Diagnosis not present

## 2018-07-16 DIAGNOSIS — K50919 Crohn's disease, unspecified, with unspecified complications: Secondary | ICD-10-CM | POA: Diagnosis not present

## 2018-07-18 DIAGNOSIS — E291 Testicular hypofunction: Secondary | ICD-10-CM | POA: Diagnosis not present

## 2018-07-18 DIAGNOSIS — N400 Enlarged prostate without lower urinary tract symptoms: Secondary | ICD-10-CM | POA: Diagnosis not present

## 2018-07-18 DIAGNOSIS — E538 Deficiency of other specified B group vitamins: Secondary | ICD-10-CM | POA: Diagnosis not present

## 2018-07-25 DIAGNOSIS — Z79899 Other long term (current) drug therapy: Secondary | ICD-10-CM | POA: Diagnosis not present

## 2018-09-16 DIAGNOSIS — N23 Unspecified renal colic: Secondary | ICD-10-CM | POA: Diagnosis not present

## 2018-09-19 DIAGNOSIS — N132 Hydronephrosis with renal and ureteral calculous obstruction: Secondary | ICD-10-CM | POA: Diagnosis not present

## 2018-09-19 DIAGNOSIS — N23 Unspecified renal colic: Secondary | ICD-10-CM | POA: Diagnosis not present

## 2018-09-19 DIAGNOSIS — N201 Calculus of ureter: Secondary | ICD-10-CM | POA: Diagnosis not present

## 2018-10-08 DIAGNOSIS — N201 Calculus of ureter: Secondary | ICD-10-CM | POA: Diagnosis not present

## 2018-10-13 DIAGNOSIS — N132 Hydronephrosis with renal and ureteral calculous obstruction: Secondary | ICD-10-CM | POA: Diagnosis not present

## 2018-10-13 DIAGNOSIS — R8271 Bacteriuria: Secondary | ICD-10-CM | POA: Diagnosis not present

## 2018-10-13 DIAGNOSIS — N201 Calculus of ureter: Secondary | ICD-10-CM | POA: Diagnosis not present

## 2018-10-14 ENCOUNTER — Other Ambulatory Visit: Payer: Self-pay | Admitting: Urology

## 2018-10-15 ENCOUNTER — Encounter (HOSPITAL_BASED_OUTPATIENT_CLINIC_OR_DEPARTMENT_OTHER): Payer: Self-pay | Admitting: *Deleted

## 2018-10-15 ENCOUNTER — Other Ambulatory Visit: Payer: Self-pay

## 2018-10-15 NOTE — Progress Notes (Signed)
Spoke w/ pt via phone for pre-op interview.  Npo after mn.  Arrive at 0830.  Will take am meds w/ sips of water dos, may take oxycodone if needed.

## 2018-10-16 DIAGNOSIS — K5010 Crohn's disease of large intestine without complications: Secondary | ICD-10-CM | POA: Diagnosis not present

## 2018-10-16 DIAGNOSIS — D126 Benign neoplasm of colon, unspecified: Secondary | ICD-10-CM | POA: Diagnosis not present

## 2018-10-20 ENCOUNTER — Encounter (HOSPITAL_BASED_OUTPATIENT_CLINIC_OR_DEPARTMENT_OTHER): Payer: Self-pay | Admitting: Anesthesiology

## 2018-10-20 ENCOUNTER — Ambulatory Visit (HOSPITAL_BASED_OUTPATIENT_CLINIC_OR_DEPARTMENT_OTHER)
Admission: RE | Admit: 2018-10-20 | Discharge: 2018-10-20 | Disposition: A | Discharge status: Home / Self Care | Payer: BLUE CROSS/BLUE SHIELD | Attending: Urology | Admitting: Urology

## 2018-10-20 ENCOUNTER — Ambulatory Visit (HOSPITAL_BASED_OUTPATIENT_CLINIC_OR_DEPARTMENT_OTHER): Payer: BLUE CROSS/BLUE SHIELD | Admitting: Anesthesiology

## 2018-10-20 ENCOUNTER — Encounter (HOSPITAL_BASED_OUTPATIENT_CLINIC_OR_DEPARTMENT_OTHER)
Admission: RE | Disposition: A | Discharge status: Home / Self Care | Payer: Self-pay | Source: Home / Self Care | Attending: Urology

## 2018-10-20 DIAGNOSIS — K5090 Crohn's disease, unspecified, without complications: Secondary | ICD-10-CM | POA: Insufficient documentation

## 2018-10-20 DIAGNOSIS — K219 Gastro-esophageal reflux disease without esophagitis: Secondary | ICD-10-CM | POA: Insufficient documentation

## 2018-10-20 DIAGNOSIS — Z7982 Long term (current) use of aspirin: Secondary | ICD-10-CM | POA: Diagnosis not present

## 2018-10-20 DIAGNOSIS — N132 Hydronephrosis with renal and ureteral calculous obstruction: Secondary | ICD-10-CM | POA: Diagnosis not present

## 2018-10-20 DIAGNOSIS — F17210 Nicotine dependence, cigarettes, uncomplicated: Secondary | ICD-10-CM | POA: Insufficient documentation

## 2018-10-20 DIAGNOSIS — Z79899 Other long term (current) drug therapy: Secondary | ICD-10-CM | POA: Insufficient documentation

## 2018-10-20 DIAGNOSIS — N201 Calculus of ureter: Secondary | ICD-10-CM | POA: Diagnosis not present

## 2018-10-20 HISTORY — DX: Calculus of ureter: N20.1

## 2018-10-20 HISTORY — PX: HOLMIUM LASER APPLICATION: SHX5852

## 2018-10-20 HISTORY — DX: Benign prostatic hyperplasia with lower urinary tract symptoms: N40.1

## 2018-10-20 HISTORY — DX: Nocturia: R35.1

## 2018-10-20 HISTORY — DX: Personal history of other diseases of male genital organs: Z87.438

## 2018-10-20 HISTORY — PX: CYSTOSCOPY WITH RETROGRADE PYELOGRAM, URETEROSCOPY AND STENT PLACEMENT: SHX5789

## 2018-10-20 HISTORY — DX: Personal history of urinary calculi: Z87.442

## 2018-10-20 HISTORY — DX: Presence of external hearing-aid: Z97.4

## 2018-10-20 SURGERY — CYSTOURETEROSCOPY, WITH RETROGRADE PYELOGRAM AND STENT INSERTION
Anesthesia: General | Site: Renal | Laterality: Right

## 2018-10-20 MED ORDER — CEFAZOLIN SODIUM-DEXTROSE 2-4 GM/100ML-% IV SOLN
INTRAVENOUS | Status: AC
  Filled 2018-10-20: qty 100

## 2018-10-20 MED ORDER — CEFAZOLIN SODIUM-DEXTROSE 2-4 GM/100ML-% IV SOLN
2.0000 g | INTRAVENOUS | Status: AC
  Administered 2018-10-20: 2 g via INTRAVENOUS
  Filled 2018-10-20: qty 100

## 2018-10-20 MED ORDER — MIDAZOLAM HCL 2 MG/2ML IJ SOLN
INTRAMUSCULAR | Status: AC
  Filled 2018-10-20: qty 2

## 2018-10-20 MED ORDER — IOHEXOL 300 MG/ML  SOLN
INTRAMUSCULAR | Status: DC | PRN
  Administered 2018-10-20: 10 mL

## 2018-10-20 MED ORDER — DEXAMETHASONE SODIUM PHOSPHATE 10 MG/ML IJ SOLN
INTRAMUSCULAR | Status: AC
  Filled 2018-10-20: qty 1

## 2018-10-20 MED ORDER — DIPHENHYDRAMINE HCL 50 MG/ML IJ SOLN
INTRAMUSCULAR | Status: AC
  Filled 2018-10-20: qty 1

## 2018-10-20 MED ORDER — FENTANYL CITRATE (PF) 100 MCG/2ML IJ SOLN
25.0000 ug | INTRAMUSCULAR | Status: DC | PRN
  Filled 2018-10-20: qty 1

## 2018-10-20 MED ORDER — ONDANSETRON HCL 4 MG/2ML IJ SOLN
INTRAMUSCULAR | Status: DC | PRN
  Administered 2018-10-20: 4 mg via INTRAVENOUS

## 2018-10-20 MED ORDER — LIDOCAINE HCL 1 % IJ SOLN
INTRAMUSCULAR | Status: DC | PRN
  Administered 2018-10-20: 30 mg via INTRADERMAL

## 2018-10-20 MED ORDER — KETOROLAC TROMETHAMINE 30 MG/ML IJ SOLN
30.0000 mg | Freq: Once | INTRAMUSCULAR | Status: DC | PRN
  Filled 2018-10-20: qty 1

## 2018-10-20 MED ORDER — ONDANSETRON HCL 4 MG/2ML IJ SOLN
INTRAMUSCULAR | Status: AC
  Filled 2018-10-20: qty 2

## 2018-10-20 MED ORDER — HYDROCODONE-ACETAMINOPHEN 5-325 MG PO TABS: 1.0000 | ORAL_TABLET | ORAL | 0 refills | Status: DC | PRN

## 2018-10-20 MED ORDER — LIDOCAINE 2% (20 MG/ML) 5 ML SYRINGE
INTRAMUSCULAR | Status: AC
  Filled 2018-10-20: qty 5

## 2018-10-20 MED ORDER — FENTANYL CITRATE (PF) 250 MCG/5ML IJ SOLN
INTRAMUSCULAR | Status: AC
  Filled 2018-10-20: qty 5

## 2018-10-20 MED ORDER — LACTATED RINGERS IV SOLN
INTRAVENOUS | Status: DC
  Administered 2018-10-20: 09:00:00 via INTRAVENOUS
  Filled 2018-10-20: qty 1000

## 2018-10-20 MED ORDER — DEXAMETHASONE SODIUM PHOSPHATE 10 MG/ML IJ SOLN
INTRAMUSCULAR | Status: DC | PRN
  Administered 2018-10-20: 10 mg via INTRAVENOUS

## 2018-10-20 MED ORDER — SODIUM CHLORIDE 0.9 % IR SOLN
Status: DC | PRN
  Administered 2018-10-20: 3000 mL

## 2018-10-20 MED ORDER — MIDAZOLAM HCL 5 MG/5ML IJ SOLN
INTRAMUSCULAR | Status: DC | PRN
  Administered 2018-10-20: 2 mg via INTRAVENOUS

## 2018-10-20 MED ORDER — PROPOFOL 10 MG/ML IV BOLUS
INTRAVENOUS | Status: AC
  Filled 2018-10-20: qty 20

## 2018-10-20 MED ORDER — FENTANYL CITRATE (PF) 100 MCG/2ML IJ SOLN
INTRAMUSCULAR | Status: DC | PRN
  Administered 2018-10-20 (×3): 50 ug via INTRAVENOUS

## 2018-10-20 MED ORDER — PROMETHAZINE HCL 25 MG/ML IJ SOLN
6.2500 mg | INTRAMUSCULAR | Status: DC | PRN
  Filled 2018-10-20: qty 1

## 2018-10-20 MED ORDER — PROPOFOL 10 MG/ML IV BOLUS
INTRAVENOUS | Status: DC | PRN
  Administered 2018-10-20: 200 mg via INTRAVENOUS

## 2018-10-20 MED ORDER — DIPHENHYDRAMINE HCL 50 MG/ML IJ SOLN
INTRAMUSCULAR | Status: DC | PRN
  Administered 2018-10-20: 25 mg via INTRAVENOUS

## 2018-10-20 SURGICAL SUPPLY — 20 items
BAG DRAIN URO-CYSTO SKYTR STRL (DRAIN) ×3 IMPLANT
BAG DRN UROCATH (DRAIN) ×2
CATH INTERMIT  6FR 70CM (CATHETERS) ×1 IMPLANT
CLOTH BEACON ORANGE TIMEOUT ST (SAFETY) ×3 IMPLANT
FIBER LASER FLEXIVA 365 (UROLOGICAL SUPPLIES) IMPLANT
FIBER LASER TRAC TIP (UROLOGICAL SUPPLIES) IMPLANT
GLOVE BIO SURGEON STRL SZ7.5 (GLOVE) ×3 IMPLANT
GOWN STRL REUS W/TWL XL LVL3 (GOWN DISPOSABLE) ×2 IMPLANT
GUIDEWIRE STR DUAL SENSOR (WIRE) ×4 IMPLANT
INFUSOR MANOMETER BAG 3000ML (MISCELLANEOUS) ×2 IMPLANT
IV NS 1000ML (IV SOLUTION)
IV NS 1000ML BAXH (IV SOLUTION) ×2 IMPLANT
IV NS IRRIG 3000ML ARTHROMATIC (IV SOLUTION) ×3 IMPLANT
KIT TURNOVER CYSTO (KITS) ×3 IMPLANT
MANIFOLD NEPTUNE II (INSTRUMENTS) ×3 IMPLANT
NS IRRIG 500ML POUR BTL (IV SOLUTION) ×5 IMPLANT
PACK CYSTO (CUSTOM PROCEDURE TRAY) ×3 IMPLANT
STENT URET 6FRX26 CONTOUR (STENTS) ×1 IMPLANT
TUBE CONNECTING 12X1/4 (SUCTIONS) ×1 IMPLANT
TUBING UROLOGY SET (TUBING) ×1 IMPLANT

## 2018-10-20 NOTE — H&P (Signed)
CC/HPI: CC: History of right renal calculi  HPI:  10/08/2018  Patient is a patient of mine that I have performed a left ureteroscopy with laser lithotripsy on in the past. The stones were not well visualized on plain film in the past. Most recently, he saw one of our extenders Fritz Pickerel on 09/19/2018. At that time he had a CT scan performed which revealed a 4.2 mm right distal ureteral calculus. There is upstream hydronephrosis. Urinalysis is negative today. He has not seen a stone pass. He has not had any pain in quite some time.    10/13/2018: He returns for f/u. He's has had increased right lower back pain radiating into the right lower quadrant of the abdomen. Denies any interval stone material passage. He has not had burning or painful urination, gross hematuria. Denies fevers/chills or nausea/vomiting. Ultrasound today shows hydronephrosis on the right but no clearly defined ureteral calculus on KUB.     ALLERGIES: No Known Drug Allergies    MEDICATIONS: Androgel 50 mg/5 gram (1 %) gel in packet  Omeprazole 20 mg tablet, delayed release  Tamsulosin Hcl 0.4 mg capsule  Adult Aspirin 81 mg tablet, delayed release  Azathioprine 50 mg tablet  Cetirizine Hcl 10 mg tablet  Fluticasone Propionate 50 mcg/actuation spray, suspension  Glucosamine-Chondroitin-Msm  Humira Pen 40 mg/0.8 ml pen injector kit  Hydrocodone-Acetaminophen 5 mg-325 mg tablet 1 tablet PO Q 4 H PRN For pain  Omega-3  Oxycodone-Acetaminophen 5 mg-325 mg tablet 1-2 tablet PO Q 6 H PRN  Sildenafil Citrate 20 mg tablet  Valacyclovir 1,000 mg tablet  Vitamin B-12 1,000 mcg capsule  Vitamin D3 2,000 unit tablet     GU PSH: Ureteroscopic laser litho, Left - 08/16/2017 Vasectomy      PSH Notes: -lung biopsy   -laparoscopic ileocecectomy with small bowel resection   -colon surgery x2   NON-GU PSH: Dental Surgery Procedure    GU PMH: Ureteral obstruction secondary to calculous - 01/04/6598 Renal colic -  35/70/1779 Ureteral calculus - 09/25/2017, - 08/14/2017 Renal calculus    NON-GU PMH: Crohn''s disease of both small and large intestine with abscess 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 Has not smoked since 08/01/2002. Smoked for 16 years. Smoked 1 pack per day.   Tobacco Use Assessment Completed: Used Tobacco in last 30 days? Drinks 2 drinks per day.  Drinks 3 caffeinated drinks per day. Patient's occupation Chartered certified accountant.    REVIEW OF SYSTEMS:    GU Review Male:   Patient reports get up at night to urinate, stream starts and stops, and erection problems. Patient denies frequent urination, hard to postpone urination, burning/ pain with urination, leakage of urine, trouble starting your stream, have to strain to urinate , and penile pain.  Gastrointestinal (Upper):   Patient denies nausea, vomiting, and indigestion/ heartburn.  Gastrointestinal (Lower):   Patient denies diarrhea and constipation.  Constitutional:   Patient denies night sweats, fever, weight loss, and fatigue.  Skin:   Patient denies skin rash/ lesion and itching.  Eyes:   Patient denies blurred vision and double vision.  Ears/ Nose/ Throat:   Patient denies sore throat and sinus problems.  Hematologic/Lymphatic:   Patient denies swollen  glands and easy bruising.  Cardiovascular:   Patient denies leg swelling and chest pains.  Respiratory:   Patient denies cough and shortness of breath.  Endocrine:   Patient denies excessive thirst.  Musculoskeletal:   Patient reports back pain. Patient denies joint pain.  Neurological:   Patient denies headaches and dizziness.   Psychologic:   Patient denies depression and anxiety.   VITAL SIGNS:      10/13/2018 09:27 AM  BP 144/79 mmHg  Pulse 83 /min  Temperature 98.6 F / 37 C   MULTI-SYSTEM PHYSICAL EXAMINATION:    Constitutional: Well-nourished. No physical deformities. Normally developed. Good grooming.  Neck: Neck symmetrical, not swollen. Normal tracheal position.  Respiratory: No labored breathing, no use of accessory muscles.   Cardiovascular: Normal temperature, adequate perfusion of extremities  Skin: No paleness, no jaundice, no cyanosis. No lesion, no ulcer, no rash.  Neurologic / Psychiatric: Oriented to time, oriented to place, oriented to person. No depression, no anxiety, no agitation.  Gastrointestinal: No mass, no tenderness, no rigidity, non obese abdomen. Mild RLQ tenderness with palpation.  Musculoskeletal: Normal gait and station of head and neck.     PAST DATA REVIEWED:  Source Of History:  Patient  Records Review:   Previous Patient Records  Urine Test Review:   Urinalysis  X-Ray Review: KUB: Reviewed Films. Discussed With Patient.  Renal Ultrasound: Reviewed Films. Discussed With Patient.     10/13/18  Urinalysis  Urine Appearance Clear   Urine Color Yellow   Urine Glucose Neg mg/dL  Urine Bilirubin Neg mg/dL  Urine Ketones Trace mg/dL  Urine Specific Gravity 1.015   Urine Blood Neg ery/uL  Urine pH 7.0   Urine Protein Trace mg/dL  Urine Urobilinogen 0.2 mg/dL  Urine Nitrites Neg   Urine Leukocyte Esterase Neg leu/uL   PROCEDURES:         KUB - 16109  A single view of the abdomen is obtained. Both renal shadows appear free of any obvious opacities to suggest renal calculi. Anatomical expected tracts of both ureters appeared clear. There appears to be an artifact streak along the anatomical expected tract of the left proximal ureter. Prominent bowel gas and stool pattern within the pelvic rim but no obvious distal ureteral calculi identified. Bladder grossly appears free  of obstruction.                Renal Ultrasound - [id number]  Right Kidney: Length:12.5 cm Depth: 6.0 cm Cortical Width: 1.2 cm Width: 5.8 cm  Left Kidney: Length: 11.7 cm Depth: 4.7 cm Cortical Width: 1.2 cm Width: 5.5 cm  Left Kidney/Ureter:  Appears WNL.   Right Kidney/Ureter:  Hydronephrosis noted.   Bladder:  PVR = 44.9 ml. Bilateral jets were not visualized.                Urinalysis Dipstick Dipstick Cont'd  Color: Yellow Bilirubin: Neg mg/dL  Appearance: Clear Ketones: Trace mg/dL  Specific Gravity: 1.015 Blood: Neg ery/uL  pH: 7.0 Protein: Trace mg/dL  Glucose: Neg mg/dL Urobilinogen: 0.2 mg/dL    Nitrites: Neg    Leukocyte Esterase: Neg leu/uL    ASSESSMENT:      ICD-10 Details  1 GU:   Ureteral calculus - N20.1   2   Ureteral obstruction secondary to calculous - N13.2 Right   PLAN:           Orders Labs Urine Culture          Schedule Return Visit/Planned Activity: Next Available Appointment -  Schedule Surgery          Document Letter(s):  Created for Patient: Clinical Summary         Notes:   He has hydro on the right today. I could not see a ureteral stone clearly via KUB. He has also had increased pain/discomfort over the weekend. He will be set up for URS at Dr Purvis Sheffield recommendation.  For ureteroscopy I described the risks which include heart attack, stroke, pulmonary embolus, death, bleeding, infection, damage to contiguous structures, positioning injury, ureteral stricture, ureteral avulsion, ureteral injury, need for ureteral stent, inability to perform ureteroscopy, need for an interval procedure, inability to clear stone burden, stent discomfort and pain.   A urine c/s will be sent. He was instructed to telephone with worsening symptomology including uncontrollable pain, painful inability to void, severely worsening lower urinary tract symptoms, fevers. If he passes a stone in the interval he will let us know that as well. All the a lot of  scheduling sheet in turn into the scheduler today and he will be contacted to set things up as soon as possible.    Signed by Jiles Crocker on 10/13/18 at 9:54 AM (EST

## 2018-10-20 NOTE — Anesthesia Preprocedure Evaluation (Signed)
Anesthesia Evaluation  Patient identified by MRN, date of birth, ID band Patient awake    Reviewed: Allergy & Precautions, NPO status , Patient's Chart, lab work & pertinent test results  Airway Mallampati: II  TM Distance: >3 FB Neck ROM: Full    Dental no notable dental hx.    Pulmonary neg pulmonary ROS, former smoker,    Pulmonary exam normal breath sounds clear to auscultation       Cardiovascular negative cardio ROS Normal cardiovascular exam Rhythm:Regular Rate:Normal     Neuro/Psych negative neurological ROS  negative psych ROS   GI/Hepatic negative GI ROS, Neg liver ROS,   Endo/Other  negative endocrine ROS  Renal/GU negative Renal ROS  negative genitourinary   Musculoskeletal negative musculoskeletal ROS (+)   Abdominal   Peds negative pediatric ROS (+)  Hematology negative hematology ROS (+)   Anesthesia Other Findings   Reproductive/Obstetrics negative OB ROS                             Anesthesia Physical Anesthesia Plan  ASA: II  Anesthesia Plan: General   Post-op Pain Management:    Induction: Intravenous  PONV Risk Score and Plan: 2 and Ondansetron, Dexamethasone and Treatment may vary due to age or medical condition  Airway Management Planned: LMA  Additional Equipment:   Intra-op Plan:   Post-operative Plan: Extubation in OR  Informed Consent: I have reviewed the patients History and Physical, chart, labs and discussed the procedure including the risks, benefits and alternatives for the proposed anesthesia with the patient or authorized representative who has indicated his/her understanding and acceptance.     Dental advisory given  Plan Discussed with: CRNA and Surgeon  Anesthesia Plan Comments:         Anesthesia Quick Evaluation

## 2018-10-20 NOTE — Transfer of Care (Signed)
Immediate Anesthesia Transfer of Care Note  Patient: Nathan Shaw  Procedure(s) Performed: CYSTOSCOPY WITH RIGHT RETROGRADE PYELOGRAM, URETEROSCOPY HOLMIUM LASER AND STENT PLACEMENT (Right Renal) HOLMIUM LASER APPLICATION (N/A Renal)  Patient Location: PACU  Anesthesia Type:General  Level of Consciousness: awake, oriented and patient cooperative  Airway & Oxygen Therapy: Patient Spontanous Breathing and Patient connected to face mask oxygen  Post-op Assessment: Report given to RN and Post -op Vital signs reviewed and stable  Post vital signs: Reviewed and stable  Last Vitals:  Vitals Value Taken Time  BP    Temp    Pulse 62 10/20/2018 10:47 AM  Resp 9 10/20/2018 10:47 AM  SpO2 100 % 10/20/2018 10:47 AM  Vitals shown include unvalidated device data.  Last Pain:  Vitals:   10/20/18 0857  TempSrc: Oral  PainSc: 0-No pain      Patients Stated Pain Goal: 5 (22/33/61 2244)  Complications: No apparent anesthesia complications

## 2018-10-20 NOTE — Op Note (Signed)
Operative Note  Preoperative diagnosis:  1.  Right ureteral calculus  Postoperative diagnosis: 1.  Right ureteral calculus  Procedure(s): 1.  Cystoscopy with right retrograde pyelogram, right ureteroscopy with laser lithotripsy and ureteral stent placement  Surgeon: Link Snuffer, MD  Assistants: None  Anesthesia: General  Complications: None immediate  EBL: Minimal  Specimens: 1.  None  Drains/Catheters: 1.  6 x 26 double-J ureteral stent  Intraoperative findings: 1.  Normal urethra and bladder 2.  Impacted distal right ureteral calculus about 5 mm in size fragmented to tiny pieces.  There was periureteral edema from the stone.  Retrograde pyelogram was normal  Indication: 63 year old male who failed trial passage of a distal right ureteral calculus presents for the previously mentioned operation  Description of procedure:  The patient was identified and consent was obtained.  The patient was taken to the operating room and placed in the supine position.  The patient was placed under general anesthesia.  Perioperative antibiotics were administered.  The patient was placed in dorsal lithotomy.  Patient was prepped and draped in a standard sterile fashion and a timeout was performed.  A 21 French rigid cystoscope was advanced into the urethra and into the bladder.  Complete cystoscopy was performed with no abnormal findings.  The right ureter was cannulated with a sensor wire which was advanced up to the kidney under fluoroscopic guidance.  A semirigid ureteroscope was advanced alongside the wire.  The distal ureter was tight so I passed a second wire through the scope and was able to navigate carefully over the wire up to the stone of interest.  There was upstream hydronephrosis from the stone.  I removed the wire within the scope and keeping the other one in place as a safety wire.  Identified the stone of interest and fragmented it to tiny fragments.  I then advanced the scope up  to the renal pelvis and no other stones were seen.  I shot a retrograde pyelogram through the scope with the findings noted above.  I then carefully withdrew the scope visualizing the entire ureter upon removal.  There was significant periureteral inflammation from the impacted stone but otherwise there was no injury and no other significant stone fragments.  I then backloaded the wire onto a rigid cystoscope and advanced that into the bladder followed by routine placement of a 6 x 26 double-J ureteral stent followed by removal of the wire.  Fluoroscopy confirmed proximal placement and direct visualization confirmed a good coil within the bladder.  I then drained the bladder and withdrew the scope.  The patient tolerated the procedure well and was stable postoperatively.  Plan: Return in 1 week for ureteral stent removal.

## 2018-10-20 NOTE — Anesthesia Procedure Notes (Signed)
Procedure Name: LMA Insertion Date/Time: 10/20/2018 10:08 AM Performed by: Garrel Ridgel, CRNA Pre-anesthesia Checklist: Patient identified, Emergency Drugs available, Suction available, Patient being monitored and Timeout performed Patient Re-evaluated:Patient Re-evaluated prior to induction Oxygen Delivery Method: Circle system utilized Preoxygenation: Pre-oxygenation with 100% oxygen Induction Type: IV induction Ventilation: Mask ventilation without difficulty LMA: LMA inserted LMA Size: 4.0 Number of attempts: 1 Tube secured with: Tape Dental Injury: Teeth and Oropharynx as per pre-operative assessment

## 2018-10-20 NOTE — Discharge Instructions (Signed)
[facility] °[phone] °Post Ureteroscopy With or Without Stent Instructions ° °Definitions: ° °Ureter: The duct that transports urine from the kidney to the bladder. °Stent:   A plastic hollow tube that is placed into the ureter, from the kidney to the                 bladder to prevent the ureter from swelling shut. ° °GENERAL INSTRUCTIONS: ° °Despite the fact that no skin incisions were used, the area around the ureter and bladder is raw and irritated. The stent is a foreign body which will further irritate the bladder wall. This irritation is manifested by increased frequency of urination, both day and night, and by an increase in the urge to urinate. In some, the urge to urinate is present almost always. Sometimes the urge is strong enough that you may not be able to stop yourself from urinating. The only real cure is to remove the stent and then give time for the bladder wall to heal which can't be done until the danger of the ureter swelling shut has passed, which varies. ° °You may see some blood in your urine while the stent is in place and a few days afterwards. Do not be alarmed, even if the urine was clear for a while. Get off your feet and drink lots of fluids until clearing occurs. If you start to pass clots or don't improve, call us. ° °DIET: °You may return to your normal diet immediately. Because of the raw surface of your bladder, alcohol, spicy foods, acid type foods and drinks with caffeine may cause irritation or frequency and should be used in moderation. To keep your urine flowing freely and to avoid constipation, drink plenty of fluids during the day ( 8-10 glasses ). °Tip: Avoid cranberry juice because it is very acidic. ° °ACTIVITY: °Your physical activity doesn't need to be restricted. However, if you are very active, you may see some blood in your urine. We suggest that you reduce your activity under these circumstances until the bleeding has stopped. ° °BOWELS: °It is  important to keep your bowels regular during the postoperative period. Straining with bowel movements can cause bleeding. A bowel movement every other day is reasonable. Use a mild laxative if needed, such as Milk of Magnesia 2-3 tablespoons, or 2 Dulcolax tablets. Call if you continue to have problems. If you have been taking narcotics for pain, before, during or after your surgery, you may be constipated. Take a laxative if necessary. ° ° °MEDICATION: °You should resume your pre-surgery medications unless told not to. °You may take oxybutynin or flomax if prescribed for bladder spasms or discomfort from the stent °Take pain medication as directed for pain refractory to conservative management ° °PROBLEMS YOU SHOULD REPORT TO US: °· Fevers over 100.5 Fahrenheit. °· Heavy bleeding, or clots ( See above notes about blood in urine ). °· Inability to urinate. °· Drug reactions ( hives, rash, nausea, vomiting, diarrhea ). °· Severe burning or pain with urination that is not improving. ° ° °Post Anesthesia Home Care Instructions ° °Activity: °Get plenty of rest for the remainder of the day. A responsible individual must stay with you for 24 hours following the procedure.  °For the next 24 hours, DO NOT: °-Drive a car °-Operate machinery °-Drink alcoholic beverages °-Take any medication unless instructed by your physician °-Make any legal decisions or sign important papers. ° °Meals: °Start with liquid foods such as gelatin or soup. Progress to regular foods   as tolerated. Avoid greasy, spicy, heavy foods. If nausea and/or vomiting occur, drink only clear liquids until the nausea and/or vomiting subsides. Call your physician if vomiting continues. ° °Special Instructions/Symptoms: °Your throat may feel dry or sore from the anesthesia or the breathing tube placed in your throat during surgery. If this causes discomfort, gargle with warm salt water. The discomfort should disappear within 24 hours. ° °If you had a scopolamine  patch placed behind your ear for the management of post- operative nausea and/or vomiting: ° °1. The medication in the patch is effective for 72 hours, after which it should be removed.  Wrap patch in a tissue and discard in the trash. Wash hands thoroughly with soap and water. °2. You may remove the patch earlier than 72 hours if you experience unpleasant side effects which may include dry mouth, dizziness or visual disturbances. °3. Avoid touching the patch. Wash your hands with soap and water after contact with the patch. °  ° ° °

## 2018-10-20 NOTE — Anesthesia Postprocedure Evaluation (Signed)
Anesthesia Post Note  Patient: Guerino Caporale  Procedure(s) Performed: CYSTOSCOPY WITH RIGHT RETROGRADE PYELOGRAM, URETEROSCOPY HOLMIUM LASER AND STENT PLACEMENT (Right Renal) HOLMIUM LASER APPLICATION (N/A Renal)     Patient location during evaluation: PACU Anesthesia Type: General Level of consciousness: awake and alert Pain management: pain level controlled Vital Signs Assessment: post-procedure vital signs reviewed and stable Respiratory status: spontaneous breathing, nonlabored ventilation, respiratory function stable and patient connected to nasal cannula oxygen Cardiovascular status: blood pressure returned to baseline and stable Postop Assessment: no apparent nausea or vomiting Anesthetic complications: no    Last Vitals:  Vitals:   10/20/18 1045 10/20/18 1100  BP: (!) 153/89 (!) 147/79  Pulse: 62 60  Resp: 12 13  Temp: 36.5 C   SpO2: 100% 100%    Last Pain:  Vitals:   10/20/18 1130  TempSrc:   PainSc: 0-No pain                 Lashunda Greis S

## 2018-10-21 ENCOUNTER — Encounter (HOSPITAL_BASED_OUTPATIENT_CLINIC_OR_DEPARTMENT_OTHER): Payer: Self-pay | Admitting: Urology

## 2018-10-22 ENCOUNTER — Other Ambulatory Visit: Payer: Self-pay | Admitting: Urology

## 2018-10-27 DIAGNOSIS — N201 Calculus of ureter: Secondary | ICD-10-CM | POA: Diagnosis not present

## 2018-11-21 DIAGNOSIS — Z980 Intestinal bypass and anastomosis status: Secondary | ICD-10-CM | POA: Diagnosis not present

## 2018-11-21 DIAGNOSIS — K635 Polyp of colon: Secondary | ICD-10-CM | POA: Diagnosis not present

## 2018-11-21 DIAGNOSIS — D123 Benign neoplasm of transverse colon: Secondary | ICD-10-CM | POA: Diagnosis not present

## 2018-11-21 DIAGNOSIS — K6389 Other specified diseases of intestine: Secondary | ICD-10-CM | POA: Diagnosis not present

## 2018-11-21 DIAGNOSIS — K5190 Ulcerative colitis, unspecified, without complications: Secondary | ICD-10-CM | POA: Diagnosis not present

## 2018-11-21 DIAGNOSIS — K633 Ulcer of intestine: Secondary | ICD-10-CM | POA: Diagnosis not present

## 2018-11-27 DIAGNOSIS — K5190 Ulcerative colitis, unspecified, without complications: Secondary | ICD-10-CM | POA: Diagnosis not present

## 2018-11-27 DIAGNOSIS — K6389 Other specified diseases of intestine: Secondary | ICD-10-CM | POA: Diagnosis not present

## 2018-12-11 DIAGNOSIS — N200 Calculus of kidney: Secondary | ICD-10-CM | POA: Diagnosis not present

## 2019-01-15 DIAGNOSIS — D126 Benign neoplasm of colon, unspecified: Secondary | ICD-10-CM | POA: Diagnosis not present

## 2019-01-15 DIAGNOSIS — K50818 Crohn's disease of both small and large intestine with other complication: Secondary | ICD-10-CM | POA: Diagnosis not present

## 2019-01-18 DIAGNOSIS — R102 Pelvic and perineal pain: Secondary | ICD-10-CM | POA: Diagnosis not present

## 2019-01-18 DIAGNOSIS — R39198 Other difficulties with micturition: Secondary | ICD-10-CM | POA: Diagnosis not present

## 2019-01-18 DIAGNOSIS — E538 Deficiency of other specified B group vitamins: Secondary | ICD-10-CM | POA: Diagnosis not present

## 2019-02-02 DIAGNOSIS — D485 Neoplasm of uncertain behavior of skin: Secondary | ICD-10-CM | POA: Diagnosis not present

## 2019-02-02 DIAGNOSIS — L820 Inflamed seborrheic keratosis: Secondary | ICD-10-CM | POA: Diagnosis not present

## 2019-02-02 DIAGNOSIS — L821 Other seborrheic keratosis: Secondary | ICD-10-CM | POA: Diagnosis not present

## 2019-04-17 DIAGNOSIS — Z6826 Body mass index (BMI) 26.0-26.9, adult: Secondary | ICD-10-CM | POA: Diagnosis not present

## 2019-04-17 DIAGNOSIS — K50814 Crohn's disease of both small and large intestine with abscess: Secondary | ICD-10-CM | POA: Diagnosis not present

## 2019-04-17 DIAGNOSIS — D126 Benign neoplasm of colon, unspecified: Secondary | ICD-10-CM | POA: Diagnosis not present

## 2019-05-05 DIAGNOSIS — H40013 Open angle with borderline findings, low risk, bilateral: Secondary | ICD-10-CM | POA: Diagnosis not present

## 2019-05-05 DIAGNOSIS — H35371 Puckering of macula, right eye: Secondary | ICD-10-CM | POA: Diagnosis not present

## 2019-05-05 DIAGNOSIS — H25013 Cortical age-related cataract, bilateral: Secondary | ICD-10-CM | POA: Diagnosis not present

## 2019-05-05 DIAGNOSIS — H2513 Age-related nuclear cataract, bilateral: Secondary | ICD-10-CM | POA: Diagnosis not present

## 2019-06-23 DIAGNOSIS — Z23 Encounter for immunization: Secondary | ICD-10-CM | POA: Diagnosis not present

## 2019-07-13 DIAGNOSIS — Z20828 Contact with and (suspected) exposure to other viral communicable diseases: Secondary | ICD-10-CM | POA: Diagnosis not present

## 2019-07-13 DIAGNOSIS — Z01812 Encounter for preprocedural laboratory examination: Secondary | ICD-10-CM | POA: Diagnosis not present

## 2019-07-13 DIAGNOSIS — Z7189 Other specified counseling: Secondary | ICD-10-CM | POA: Diagnosis not present

## 2019-07-15 DIAGNOSIS — Z8719 Personal history of other diseases of the digestive system: Secondary | ICD-10-CM | POA: Diagnosis not present

## 2019-07-15 DIAGNOSIS — K5080 Crohn's disease of both small and large intestine without complications: Secondary | ICD-10-CM | POA: Diagnosis not present

## 2019-07-15 DIAGNOSIS — D126 Benign neoplasm of colon, unspecified: Secondary | ICD-10-CM | POA: Diagnosis not present

## 2019-07-15 DIAGNOSIS — Z79899 Other long term (current) drug therapy: Secondary | ICD-10-CM | POA: Diagnosis not present

## 2019-07-15 DIAGNOSIS — K50814 Crohn's disease of both small and large intestine with abscess: Secondary | ICD-10-CM | POA: Diagnosis not present

## 2019-07-15 DIAGNOSIS — K50818 Crohn's disease of both small and large intestine with other complication: Secondary | ICD-10-CM | POA: Diagnosis not present

## 2019-08-04 DIAGNOSIS — Z09 Encounter for follow-up examination after completed treatment for conditions other than malignant neoplasm: Secondary | ICD-10-CM | POA: Diagnosis not present

## 2019-08-04 DIAGNOSIS — K50814 Crohn's disease of both small and large intestine with abscess: Secondary | ICD-10-CM | POA: Diagnosis not present

## 2019-08-05 DIAGNOSIS — Z932 Ileostomy status: Secondary | ICD-10-CM | POA: Diagnosis not present

## 2019-08-14 ENCOUNTER — Other Ambulatory Visit: Payer: Self-pay | Admitting: Gastroenterology

## 2019-08-14 DIAGNOSIS — R9389 Abnormal findings on diagnostic imaging of other specified body structures: Secondary | ICD-10-CM

## 2019-08-14 DIAGNOSIS — K50919 Crohn's disease, unspecified, with unspecified complications: Secondary | ICD-10-CM | POA: Diagnosis not present

## 2019-08-14 DIAGNOSIS — R634 Abnormal weight loss: Secondary | ICD-10-CM

## 2019-08-14 DIAGNOSIS — R899 Unspecified abnormal finding in specimens from other organs, systems and tissues: Secondary | ICD-10-CM | POA: Diagnosis not present

## 2019-08-18 ENCOUNTER — Ambulatory Visit
Admission: RE | Admit: 2019-08-18 | Discharge: 2019-08-18 | Disposition: A | Discharge status: Home / Self Care | Payer: BLUE CROSS/BLUE SHIELD | Source: Ambulatory Visit | Attending: Gastroenterology | Admitting: Gastroenterology

## 2019-08-18 DIAGNOSIS — R9389 Abnormal findings on diagnostic imaging of other specified body structures: Secondary | ICD-10-CM

## 2019-08-18 DIAGNOSIS — R634 Abnormal weight loss: Secondary | ICD-10-CM

## 2019-08-18 DIAGNOSIS — K7689 Other specified diseases of liver: Secondary | ICD-10-CM | POA: Diagnosis not present

## 2019-08-21 DIAGNOSIS — K50814 Crohn's disease of both small and large intestine with abscess: Secondary | ICD-10-CM | POA: Diagnosis not present

## 2019-08-21 DIAGNOSIS — Z09 Encounter for follow-up examination after completed treatment for conditions other than malignant neoplasm: Secondary | ICD-10-CM | POA: Diagnosis not present

## 2019-08-25 DIAGNOSIS — Z09 Encounter for follow-up examination after completed treatment for conditions other than malignant neoplasm: Secondary | ICD-10-CM | POA: Diagnosis not present

## 2019-08-25 DIAGNOSIS — K50814 Crohn's disease of both small and large intestine with abscess: Secondary | ICD-10-CM | POA: Diagnosis not present

## 2019-09-03 DIAGNOSIS — Z9049 Acquired absence of other specified parts of digestive tract: Secondary | ICD-10-CM | POA: Diagnosis not present

## 2019-09-03 DIAGNOSIS — K5090 Crohn's disease, unspecified, without complications: Secondary | ICD-10-CM | POA: Diagnosis not present

## 2019-09-03 DIAGNOSIS — K760 Fatty (change of) liver, not elsewhere classified: Secondary | ICD-10-CM | POA: Diagnosis not present

## 2019-09-03 DIAGNOSIS — Z932 Ileostomy status: Secondary | ICD-10-CM | POA: Diagnosis not present

## 2019-09-14 DIAGNOSIS — Z932 Ileostomy status: Secondary | ICD-10-CM | POA: Diagnosis not present

## 2019-09-16 DIAGNOSIS — Z0000 Encounter for general adult medical examination without abnormal findings: Secondary | ICD-10-CM | POA: Diagnosis not present

## 2019-09-22 DIAGNOSIS — Z125 Encounter for screening for malignant neoplasm of prostate: Secondary | ICD-10-CM | POA: Diagnosis not present

## 2019-09-22 DIAGNOSIS — E538 Deficiency of other specified B group vitamins: Secondary | ICD-10-CM | POA: Diagnosis not present

## 2019-09-22 DIAGNOSIS — E785 Hyperlipidemia, unspecified: Secondary | ICD-10-CM | POA: Diagnosis not present

## 2019-10-22 DIAGNOSIS — Z932 Ileostomy status: Secondary | ICD-10-CM | POA: Diagnosis not present

## 2019-11-24 DIAGNOSIS — Z932 Ileostomy status: Secondary | ICD-10-CM | POA: Diagnosis not present

## 2019-11-29 ENCOUNTER — Ambulatory Visit: Payer: BC Managed Care – PPO | Attending: Internal Medicine

## 2019-11-29 DIAGNOSIS — Z23 Encounter for immunization: Secondary | ICD-10-CM | POA: Insufficient documentation

## 2019-11-29 NOTE — Progress Notes (Signed)
   Covid-19 Vaccination Clinic  Name:  Nathan Shaw    MRN: [id number] DOB: November 06, 1955  11/29/2019  Mr. Nathan Shaw was observed post Covid-19 immunization for 15 minutes without incidence. He was provided with Vaccine Information Sheet and instruction to access the V-Safe system.   Mr. Nathan Shaw was instructed to call 911 with any severe reactions post vaccine: Marland Kitchen Difficulty breathing  . Swelling of your face and throat  . A fast heartbeat  . A bad rash all over your body  . Dizziness and weakness    Immunizations Administered    Name Date Dose VIS Date Route   Pfizer COVID-19 Vaccine 11/29/2019  9:22 AM 0.3 mL 09/11/2019 Intramuscular   Manufacturer: [place]   Lot: [id number]   [place]: [id number]

## 2019-12-23 ENCOUNTER — Ambulatory Visit: Payer: BC Managed Care – PPO | Attending: Internal Medicine

## 2019-12-23 DIAGNOSIS — Z23 Encounter for immunization: Secondary | ICD-10-CM

## 2019-12-23 NOTE — Progress Notes (Signed)
   Covid-19 Vaccination Clinic  Name:  Nathan Shaw    MRN: [id number] DOB: [birth year]  12/23/2019  Mr. Nathan Shaw was observed post Covid-19 immunization for 15 minutes without incident. He was provided with Vaccine Information Sheet and instruction to access the V-Safe system.   Mr. Nathan Shaw was instructed to call 911 with any severe reactions post vaccine: Marland Kitchen Difficulty breathing  . Swelling of face and throat  . A fast heartbeat  . A bad rash all over body  . Dizziness and weakness   Immunizations Administered    Name Date Dose VIS Date Route   Pfizer COVID-19 Vaccine 12/23/2019 11:44 AM 0.3 mL 09/11/2019 Intramuscular   Manufacturer: [place]   Lot: [id number]   [place]: [id number]

## 2019-12-28 DIAGNOSIS — Z932 Ileostomy status: Secondary | ICD-10-CM | POA: Diagnosis not present

## 2020-01-14 DIAGNOSIS — K5000 Crohn's disease of small intestine without complications: Secondary | ICD-10-CM | POA: Diagnosis not present

## 2020-01-14 DIAGNOSIS — Z932 Ileostomy status: Secondary | ICD-10-CM | POA: Diagnosis not present

## 2020-01-20 DIAGNOSIS — H9201 Otalgia, right ear: Secondary | ICD-10-CM | POA: Diagnosis not present

## 2020-01-26 DIAGNOSIS — K1379 Other lesions of oral mucosa: Secondary | ICD-10-CM | POA: Diagnosis not present

## 2020-01-29 DIAGNOSIS — Z932 Ileostomy status: Secondary | ICD-10-CM | POA: Diagnosis not present

## 2020-02-23 DIAGNOSIS — Z6825 Body mass index (BMI) 25.0-25.9, adult: Secondary | ICD-10-CM | POA: Diagnosis not present

## 2020-02-23 DIAGNOSIS — K50814 Crohn's disease of both small and large intestine with abscess: Secondary | ICD-10-CM | POA: Diagnosis not present

## 2020-03-01 DIAGNOSIS — Z932 Ileostomy status: Secondary | ICD-10-CM | POA: Diagnosis not present

## 2020-03-07 DIAGNOSIS — Z980 Intestinal bypass and anastomosis status: Secondary | ICD-10-CM | POA: Diagnosis not present

## 2020-03-07 DIAGNOSIS — Z932 Ileostomy status: Secondary | ICD-10-CM | POA: Diagnosis not present

## 2020-03-07 DIAGNOSIS — K5090 Crohn's disease, unspecified, without complications: Secondary | ICD-10-CM | POA: Diagnosis not present

## 2020-03-07 DIAGNOSIS — K529 Noninfective gastroenteritis and colitis, unspecified: Secondary | ICD-10-CM | POA: Diagnosis not present

## 2020-03-08 DIAGNOSIS — Z01818 Encounter for other preprocedural examination: Secondary | ICD-10-CM | POA: Diagnosis not present

## 2020-03-08 DIAGNOSIS — K50814 Crohn's disease of both small and large intestine with abscess: Secondary | ICD-10-CM | POA: Diagnosis not present

## 2020-03-14 DIAGNOSIS — K5000 Crohn's disease of small intestine without complications: Secondary | ICD-10-CM | POA: Diagnosis not present

## 2020-03-23 DIAGNOSIS — K660 Peritoneal adhesions (postprocedural) (postinfection): Secondary | ICD-10-CM | POA: Diagnosis not present

## 2020-03-23 DIAGNOSIS — Z79899 Other long term (current) drug therapy: Secondary | ICD-10-CM | POA: Diagnosis not present

## 2020-03-23 DIAGNOSIS — Z932 Ileostomy status: Secondary | ICD-10-CM | POA: Insufficient documentation

## 2020-03-23 DIAGNOSIS — Z8719 Personal history of other diseases of the digestive system: Secondary | ICD-10-CM | POA: Diagnosis not present

## 2020-03-23 DIAGNOSIS — Z432 Encounter for attention to ileostomy: Secondary | ICD-10-CM | POA: Diagnosis not present

## 2020-03-23 DIAGNOSIS — Z9049 Acquired absence of other specified parts of digestive tract: Secondary | ICD-10-CM | POA: Diagnosis not present

## 2020-03-23 DIAGNOSIS — K50814 Crohn's disease of both small and large intestine with abscess: Secondary | ICD-10-CM | POA: Diagnosis not present

## 2020-03-23 DIAGNOSIS — Z87891 Personal history of nicotine dependence: Secondary | ICD-10-CM | POA: Diagnosis not present

## 2020-03-26 IMAGING — US US ABDOMEN LIMITED
1 series · 14 of 25 positions shown · non-contrast
Comparison: CT abdomen 08/09/2017, 04/10/2017.

CLINICAL DATA: Weight loss. Abnormal CT scan 9166. History of
Crohn's disease.

EXAM:
ULTRASOUND ABDOMEN LIMITED RIGHT UPPER QUADRANT

[Series 1: us abdomen limited · 0.14mm/px · 14 of 48 slices shown]
[im 1/48]
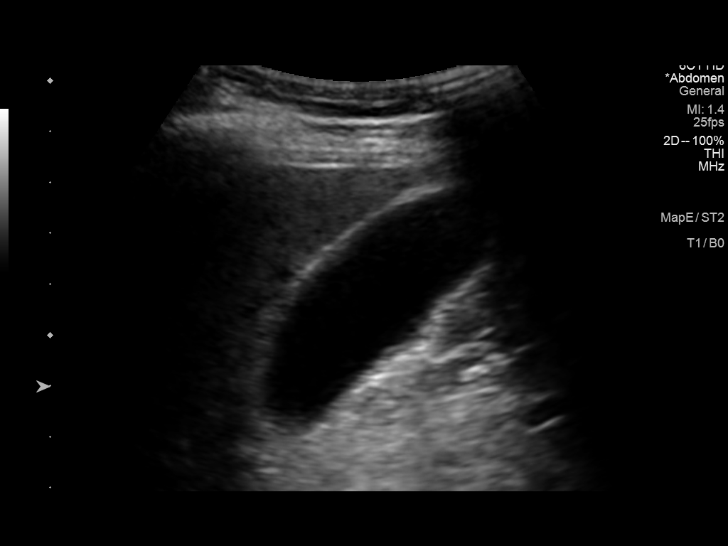
[im 4/48]
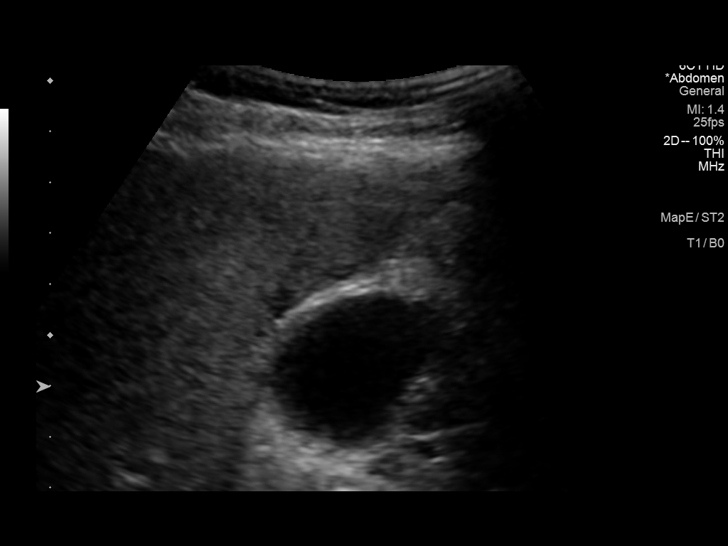
[im 8/48]
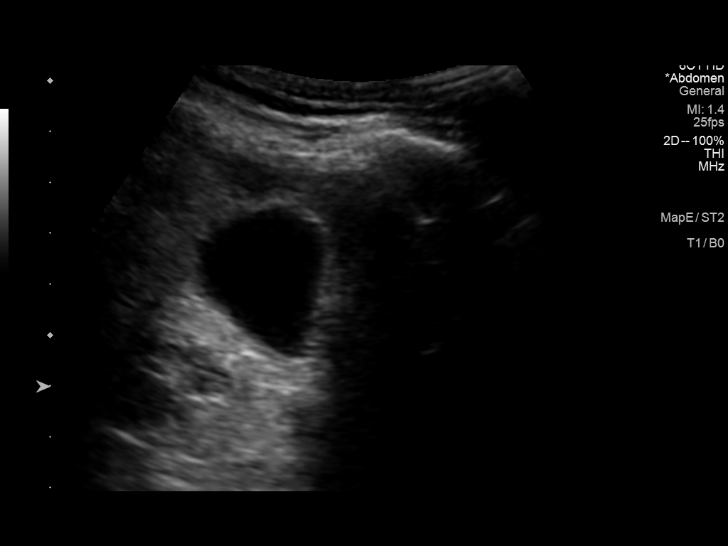
[im 12/48]
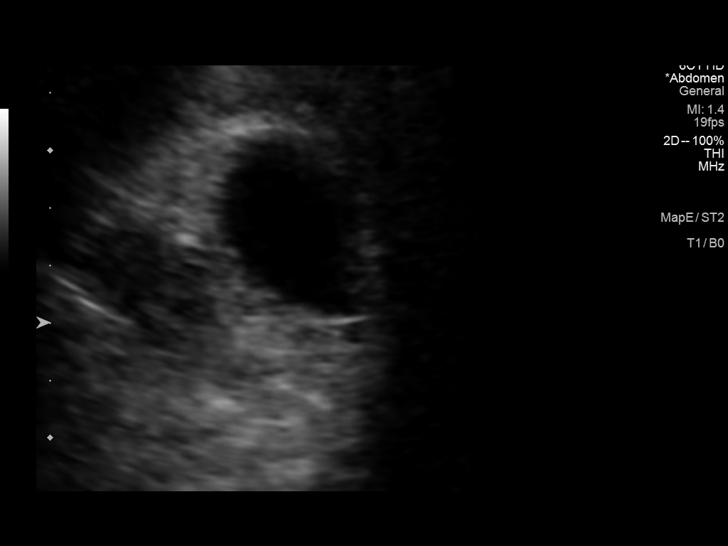
[im 16/48]
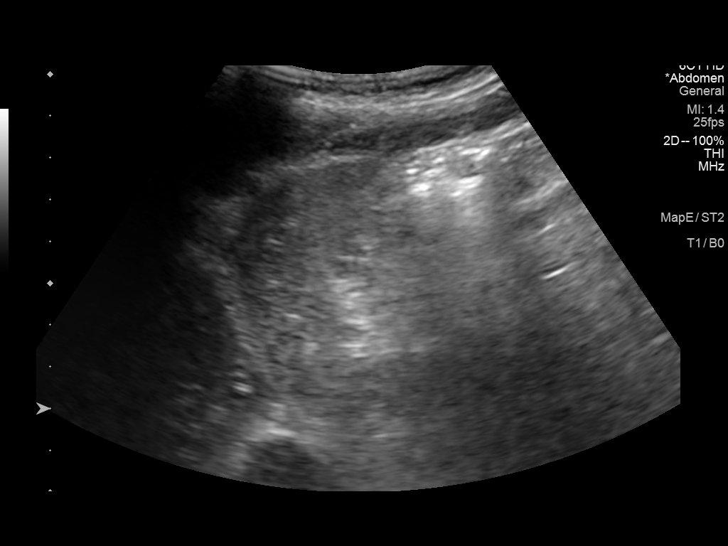
[im 18/48]
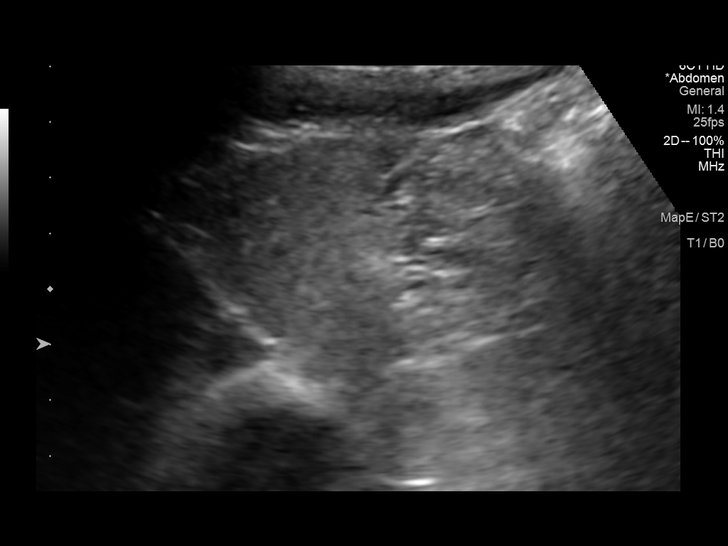
[im 22/48]
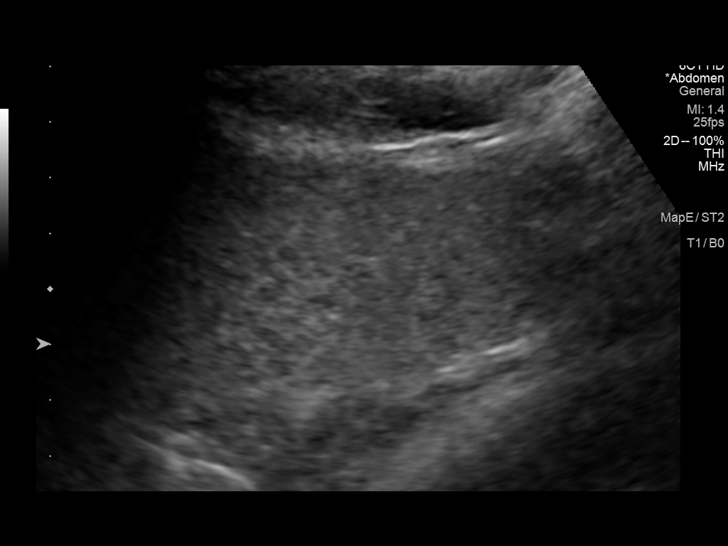
[im 26/48]
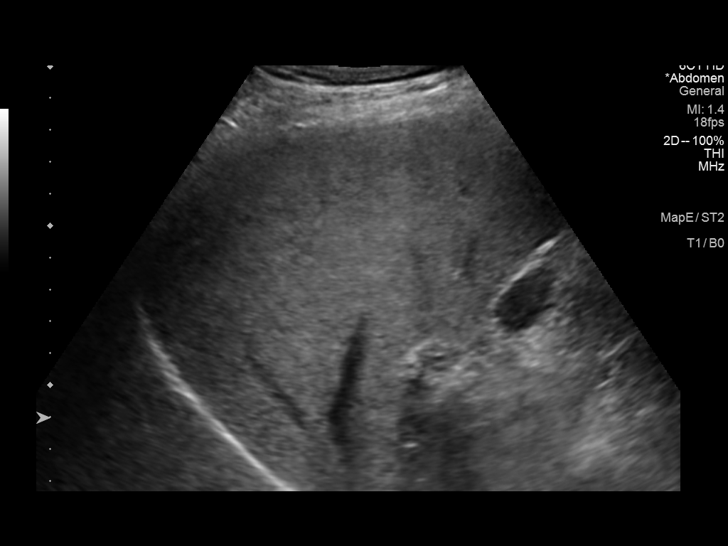
[im 30/48]
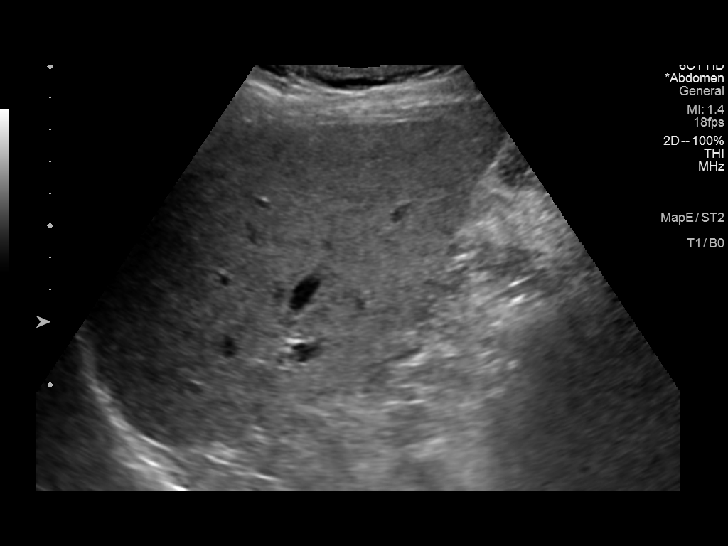
[im 32/48]
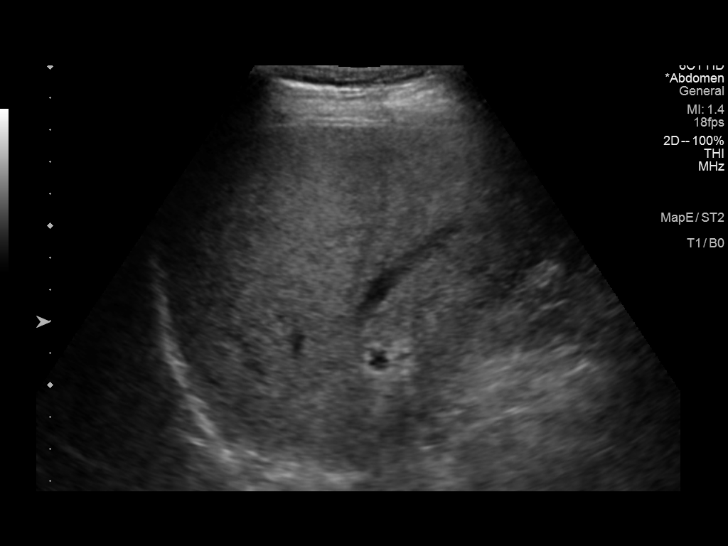
[im 36/48]
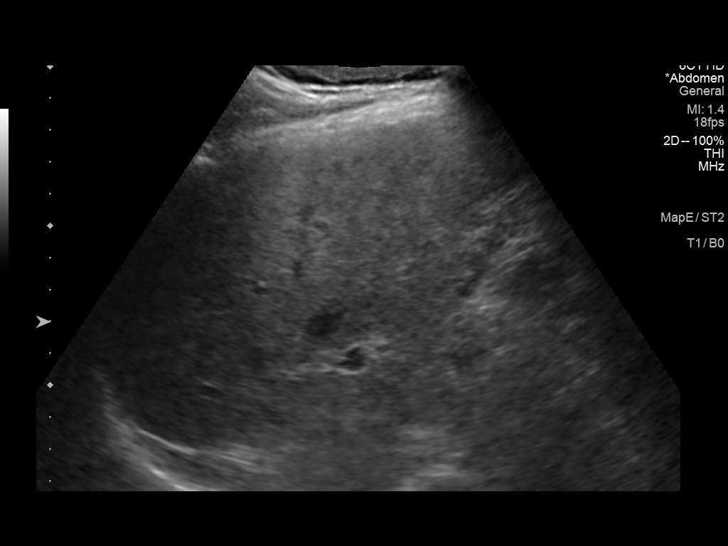
[im 40/48]
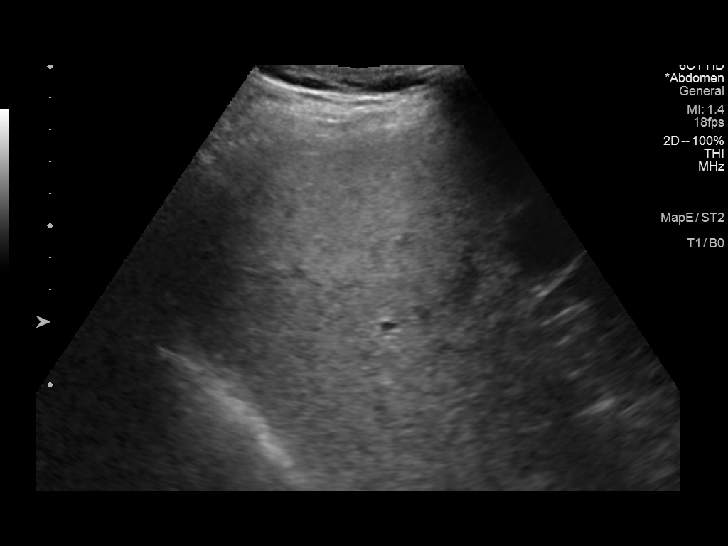
[im 44/48]
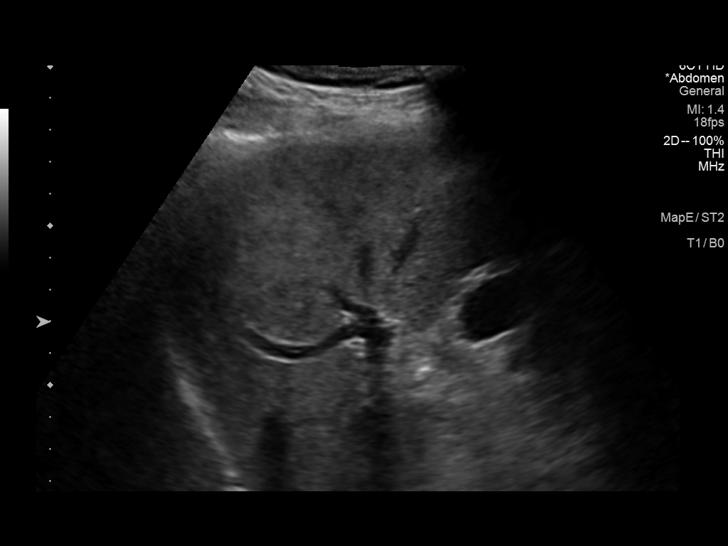
[im 48/48]
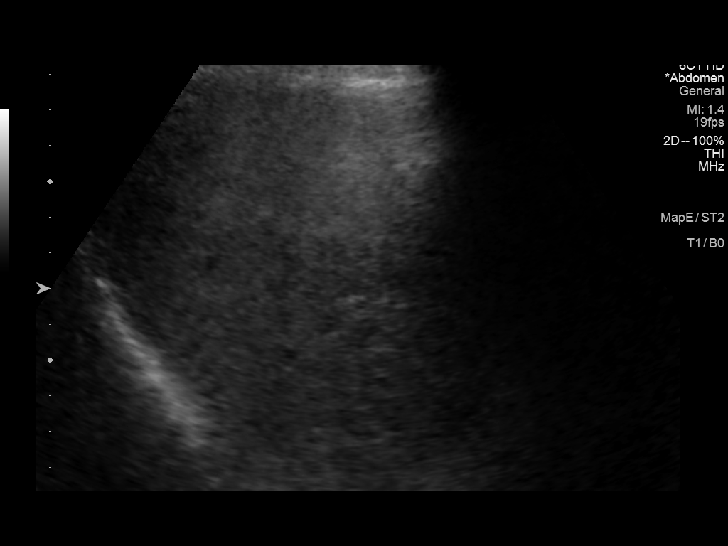

[14 of 25 positions shown; findings below may reference images not displayed]

FINDINGS: Gallbladder:

No gallstones or wall thickening visualized. No sonographic Murphy
sign noted by sonographer.

Common bile duct:

Diameter: 2.8 mm

Liver:

Diffusely increased echogenicity liver without focal lesion. Portal
vein is patent on color Doppler imaging with normal direction of
blood flow towards the liver.

Other: None.
IMPRESSION: Negative for gallstones

Echogenic liver compatible fatty infiltration as suggested on prior
CT. No focal liver lesion.

## 2020-03-31 DIAGNOSIS — B370 Candidal stomatitis: Secondary | ICD-10-CM | POA: Diagnosis not present

## 2020-04-12 DIAGNOSIS — K50119 Crohn's disease of large intestine with unspecified complications: Secondary | ICD-10-CM | POA: Diagnosis not present

## 2020-04-12 DIAGNOSIS — Z09 Encounter for follow-up examination after completed treatment for conditions other than malignant neoplasm: Secondary | ICD-10-CM | POA: Diagnosis not present

## 2020-05-05 DIAGNOSIS — H40013 Open angle with borderline findings, low risk, bilateral: Secondary | ICD-10-CM | POA: Diagnosis not present

## 2020-05-05 DIAGNOSIS — H2513 Age-related nuclear cataract, bilateral: Secondary | ICD-10-CM | POA: Diagnosis not present

## 2020-05-05 DIAGNOSIS — H524 Presbyopia: Secondary | ICD-10-CM | POA: Diagnosis not present

## 2020-05-05 DIAGNOSIS — H25013 Cortical age-related cataract, bilateral: Secondary | ICD-10-CM | POA: Diagnosis not present

## 2020-05-05 DIAGNOSIS — H35371 Puckering of macula, right eye: Secondary | ICD-10-CM | POA: Diagnosis not present

## 2020-05-11 DIAGNOSIS — L821 Other seborrheic keratosis: Secondary | ICD-10-CM | POA: Diagnosis not present

## 2020-05-11 DIAGNOSIS — D225 Melanocytic nevi of trunk: Secondary | ICD-10-CM | POA: Diagnosis not present

## 2020-05-11 DIAGNOSIS — L308 Other specified dermatitis: Secondary | ICD-10-CM | POA: Diagnosis not present

## 2020-05-11 DIAGNOSIS — D485 Neoplasm of uncertain behavior of skin: Secondary | ICD-10-CM | POA: Diagnosis not present

## 2020-05-31 DIAGNOSIS — H903 Sensorineural hearing loss, bilateral: Secondary | ICD-10-CM | POA: Diagnosis not present

## 2020-05-31 DIAGNOSIS — H6241 Otitis externa in other diseases classified elsewhere, right ear: Secondary | ICD-10-CM | POA: Diagnosis not present

## 2020-05-31 DIAGNOSIS — H61892 Other specified disorders of left external ear: Secondary | ICD-10-CM | POA: Diagnosis not present

## 2020-05-31 DIAGNOSIS — B369 Superficial mycosis, unspecified: Secondary | ICD-10-CM | POA: Diagnosis not present

## 2020-06-30 DIAGNOSIS — H6241 Otitis externa in other diseases classified elsewhere, right ear: Secondary | ICD-10-CM | POA: Diagnosis not present

## 2020-06-30 DIAGNOSIS — B369 Superficial mycosis, unspecified: Secondary | ICD-10-CM | POA: Diagnosis not present

## 2020-07-07 DIAGNOSIS — Z23 Encounter for immunization: Secondary | ICD-10-CM | POA: Diagnosis not present

## 2020-07-21 DIAGNOSIS — H748X1 Other specified disorders of right middle ear and mastoid: Secondary | ICD-10-CM | POA: Diagnosis not present

## 2020-07-21 DIAGNOSIS — H938X2 Other specified disorders of left ear: Secondary | ICD-10-CM | POA: Diagnosis not present

## 2020-07-21 DIAGNOSIS — H748X2 Other specified disorders of left middle ear and mastoid: Secondary | ICD-10-CM | POA: Diagnosis not present

## 2020-07-21 DIAGNOSIS — Z8669 Personal history of other diseases of the nervous system and sense organs: Secondary | ICD-10-CM | POA: Insufficient documentation

## 2020-10-03 DIAGNOSIS — K50919 Crohn's disease, unspecified, with unspecified complications: Secondary | ICD-10-CM | POA: Diagnosis not present

## 2020-10-03 DIAGNOSIS — E785 Hyperlipidemia, unspecified: Secondary | ICD-10-CM | POA: Diagnosis not present

## 2020-10-03 DIAGNOSIS — E559 Vitamin D deficiency, unspecified: Secondary | ICD-10-CM | POA: Diagnosis not present

## 2020-10-03 DIAGNOSIS — Z125 Encounter for screening for malignant neoplasm of prostate: Secondary | ICD-10-CM | POA: Diagnosis not present

## 2020-10-03 DIAGNOSIS — Z0000 Encounter for general adult medical examination without abnormal findings: Secondary | ICD-10-CM | POA: Diagnosis not present

## 2020-10-06 DIAGNOSIS — H6122 Impacted cerumen, left ear: Secondary | ICD-10-CM | POA: Diagnosis not present

## 2020-10-06 DIAGNOSIS — Z23 Encounter for immunization: Secondary | ICD-10-CM | POA: Diagnosis not present

## 2020-10-06 DIAGNOSIS — Z0000 Encounter for general adult medical examination without abnormal findings: Secondary | ICD-10-CM | POA: Diagnosis not present

## 2020-10-18 ENCOUNTER — Other Ambulatory Visit: Payer: Self-pay | Admitting: Family Medicine

## 2020-10-18 DIAGNOSIS — M8580 Other specified disorders of bone density and structure, unspecified site: Secondary | ICD-10-CM

## 2020-11-14 DIAGNOSIS — Z9049 Acquired absence of other specified parts of digestive tract: Secondary | ICD-10-CM | POA: Diagnosis not present

## 2020-11-14 DIAGNOSIS — Z8719 Personal history of other diseases of the digestive system: Secondary | ICD-10-CM | POA: Diagnosis not present

## 2020-11-15 DIAGNOSIS — N390 Urinary tract infection, site not specified: Secondary | ICD-10-CM | POA: Diagnosis not present

## 2021-01-24 DIAGNOSIS — M8589 Other specified disorders of bone density and structure, multiple sites: Secondary | ICD-10-CM | POA: Diagnosis not present

## 2021-01-31 DIAGNOSIS — E559 Vitamin D deficiency, unspecified: Secondary | ICD-10-CM | POA: Diagnosis not present

## 2021-01-31 DIAGNOSIS — E785 Hyperlipidemia, unspecified: Secondary | ICD-10-CM | POA: Diagnosis not present

## 2021-02-06 ENCOUNTER — Other Ambulatory Visit: Payer: Self-pay

## 2021-02-06 ENCOUNTER — Ambulatory Visit (INDEPENDENT_AMBULATORY_CARE_PROVIDER_SITE_OTHER): Payer: BC Managed Care – PPO

## 2021-02-06 ENCOUNTER — Ambulatory Visit (INDEPENDENT_AMBULATORY_CARE_PROVIDER_SITE_OTHER): Payer: BC Managed Care – PPO | Admitting: Podiatry

## 2021-02-06 DIAGNOSIS — M79671 Pain in right foot: Secondary | ICD-10-CM | POA: Diagnosis not present

## 2021-02-06 DIAGNOSIS — D3610 Benign neoplasm of peripheral nerves and autonomic nervous system, unspecified: Secondary | ICD-10-CM | POA: Diagnosis not present

## 2021-02-06 DIAGNOSIS — M79672 Pain in left foot: Secondary | ICD-10-CM

## 2021-02-06 NOTE — Patient Instructions (Signed)

## 2021-02-08 NOTE — Progress Notes (Signed)
Subjective:   Patient ID: Nathan Shaw, male   DOB: 65 y.o.   MRN: [id number]   HPI Patient states he has pain in both feet but the left is much worse and states that he has had shooting pains and numbness which has been present for a long time between the third and fourth toes left with gradual increase in pain over that period.  Patient does not smoke likes to be active if possible and this is causing trouble as far as that goes   Review of Systems  All other systems reviewed and are negative.       Objective:  Physical Exam Vitals and nursing note reviewed.  Constitutional:      Appearance: He is well-developed.  Pulmonary:     Effort: Pulmonary effort is normal.  Musculoskeletal:        General: Normal range of motion.  Skin:    General: Skin is warm.  Neurological:     Mental Status: He is alert.     Neurovascular status intact muscle strength found to be adequate range of motion adequate.  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  Patient is encouraged to call with questions concerns which may arise  X-rays indicate no signs of bony structural pathology appears to be a soft tissue phenomena

## 2021-02-20 ENCOUNTER — Other Ambulatory Visit: Payer: Self-pay

## 2021-02-20 ENCOUNTER — Ambulatory Visit (INDEPENDENT_AMBULATORY_CARE_PROVIDER_SITE_OTHER): Payer: BC Managed Care – PPO | Admitting: Podiatry

## 2021-02-20 ENCOUNTER — Encounter: Payer: Self-pay | Admitting: Podiatry

## 2021-02-20 DIAGNOSIS — D3610 Benign neoplasm of peripheral nerves and autonomic nervous system, unspecified: Secondary | ICD-10-CM

## 2021-02-20 NOTE — Progress Notes (Signed)
Subjective:   Patient ID: Nathan Shaw, male   DOB: 65 y.o.   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  At this point sterile prep and injected the third interspace with a purified alcohol Marcaine solution 1.5 cc applied sterile dressing reappoint to recheck

## 2021-03-02 DIAGNOSIS — K626 Ulcer of anus and rectum: Secondary | ICD-10-CM | POA: Diagnosis not present

## 2021-03-02 DIAGNOSIS — K5289 Other specified noninfective gastroenteritis and colitis: Secondary | ICD-10-CM | POA: Diagnosis not present

## 2021-03-02 DIAGNOSIS — K6289 Other specified diseases of anus and rectum: Secondary | ICD-10-CM | POA: Diagnosis not present

## 2021-03-02 DIAGNOSIS — Z8719 Personal history of other diseases of the digestive system: Secondary | ICD-10-CM | POA: Diagnosis not present

## 2021-03-06 ENCOUNTER — Encounter: Payer: Self-pay | Admitting: Podiatry

## 2021-03-06 ENCOUNTER — Other Ambulatory Visit: Payer: Self-pay

## 2021-03-06 ENCOUNTER — Ambulatory Visit (INDEPENDENT_AMBULATORY_CARE_PROVIDER_SITE_OTHER): Payer: BC Managed Care – PPO | Admitting: Podiatry

## 2021-03-06 DIAGNOSIS — D3610 Benign neoplasm of peripheral nerves and autonomic nervous system, unspecified: Secondary | ICD-10-CM

## 2021-03-06 DIAGNOSIS — G5762 Lesion of plantar nerve, left lower limb: Secondary | ICD-10-CM

## 2021-03-07 NOTE — Progress Notes (Signed)
Subjective:   Patient ID: Nathan Shaw, male   DOB: 65 y.o.   MRN: [id number]   HPI Patient presents stating I have had some improvement but still having a lot of pain in my left foot   ROS      Objective:  Physical Exam  Neurovascular status intact with continued exquisite discomfort in the third interspace left with radiating discomfort and pain with pressure.  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  Also utilize a small amount of steroid to try to reduce inflammation reappoint to recheck

## 2021-03-10 DIAGNOSIS — K50818 Crohn's disease of both small and large intestine with other complication: Secondary | ICD-10-CM | POA: Diagnosis not present

## 2021-04-10 ENCOUNTER — Encounter: Payer: Self-pay | Admitting: Podiatry

## 2021-04-10 ENCOUNTER — Ambulatory Visit: Payer: BC Managed Care – PPO | Admitting: Podiatry

## 2021-04-10 ENCOUNTER — Other Ambulatory Visit: Payer: Self-pay

## 2021-04-10 ENCOUNTER — Ambulatory Visit (INDEPENDENT_AMBULATORY_CARE_PROVIDER_SITE_OTHER): Payer: BC Managed Care – PPO | Admitting: Podiatry

## 2021-04-10 DIAGNOSIS — D3610 Benign neoplasm of peripheral nerves and autonomic nervous system, unspecified: Secondary | ICD-10-CM

## 2021-04-10 NOTE — Progress Notes (Signed)
Subjective:   Patient ID: Nathan Shaw, male   DOB: 65 y.o.   MRN: [id number]   HPI Patient states he is doing well with injection and probably could use 1 more but at this point states he is about 80% better   ROS      Objective:  Physical Exam  Neurovascular status intact with patient's left forefoot improving, positive Biagio Borg sign still noted but reduce discomfort     Assessment:  Improving from neuroma-like symptomatology left     Plan:  Sterile prep did 1 final injection consisting of steroid and purified dehydrated alcohol Marcaine solution.  Patient's discharge reappoint as needed with sterile dressing applied after procedure done

## 2021-05-08 DIAGNOSIS — H25013 Cortical age-related cataract, bilateral: Secondary | ICD-10-CM | POA: Diagnosis not present

## 2021-05-08 DIAGNOSIS — H35371 Puckering of macula, right eye: Secondary | ICD-10-CM | POA: Diagnosis not present

## 2021-05-08 DIAGNOSIS — H40013 Open angle with borderline findings, low risk, bilateral: Secondary | ICD-10-CM | POA: Diagnosis not present

## 2021-05-08 DIAGNOSIS — H2513 Age-related nuclear cataract, bilateral: Secondary | ICD-10-CM | POA: Diagnosis not present

## 2021-06-06 DIAGNOSIS — K633 Ulcer of intestine: Secondary | ICD-10-CM | POA: Diagnosis not present

## 2021-06-06 DIAGNOSIS — K6389 Other specified diseases of intestine: Secondary | ICD-10-CM | POA: Diagnosis not present

## 2021-06-06 DIAGNOSIS — K529 Noninfective gastroenteritis and colitis, unspecified: Secondary | ICD-10-CM | POA: Diagnosis not present

## 2021-06-06 DIAGNOSIS — K50019 Crohn's disease of small intestine with unspecified complications: Secondary | ICD-10-CM | POA: Diagnosis not present

## 2021-06-06 DIAGNOSIS — K5010 Crohn's disease of large intestine without complications: Secondary | ICD-10-CM | POA: Diagnosis not present

## 2021-06-06 DIAGNOSIS — K5289 Other specified noninfective gastroenteritis and colitis: Secondary | ICD-10-CM | POA: Diagnosis not present

## 2021-06-06 DIAGNOSIS — K625 Hemorrhage of anus and rectum: Secondary | ICD-10-CM | POA: Diagnosis not present

## 2021-06-06 DIAGNOSIS — K6289 Other specified diseases of anus and rectum: Secondary | ICD-10-CM | POA: Diagnosis not present

## 2021-06-06 DIAGNOSIS — K626 Ulcer of anus and rectum: Secondary | ICD-10-CM | POA: Diagnosis not present

## 2021-06-07 DIAGNOSIS — K50814 Crohn's disease of both small and large intestine with abscess: Secondary | ICD-10-CM | POA: Diagnosis not present

## 2021-06-22 DIAGNOSIS — H7201 Central perforation of tympanic membrane, right ear: Secondary | ICD-10-CM | POA: Diagnosis not present

## 2021-06-22 DIAGNOSIS — H9211 Otorrhea, right ear: Secondary | ICD-10-CM | POA: Diagnosis not present

## 2021-06-23 DIAGNOSIS — R109 Unspecified abdominal pain: Secondary | ICD-10-CM | POA: Diagnosis not present

## 2021-06-23 DIAGNOSIS — K50818 Crohn's disease of both small and large intestine with other complication: Secondary | ICD-10-CM | POA: Diagnosis not present

## 2021-07-06 DIAGNOSIS — H7201 Central perforation of tympanic membrane, right ear: Secondary | ICD-10-CM | POA: Diagnosis not present

## 2021-07-11 DIAGNOSIS — K50019 Crohn's disease of small intestine with unspecified complications: Secondary | ICD-10-CM | POA: Diagnosis not present

## 2021-07-12 DIAGNOSIS — L308 Other specified dermatitis: Secondary | ICD-10-CM | POA: Diagnosis not present

## 2021-07-12 DIAGNOSIS — L853 Xerosis cutis: Secondary | ICD-10-CM | POA: Diagnosis not present

## 2021-07-12 DIAGNOSIS — L570 Actinic keratosis: Secondary | ICD-10-CM | POA: Diagnosis not present

## 2021-07-12 DIAGNOSIS — D2262 Melanocytic nevi of left upper limb, including shoulder: Secondary | ICD-10-CM | POA: Diagnosis not present

## 2021-07-12 DIAGNOSIS — D225 Melanocytic nevi of trunk: Secondary | ICD-10-CM | POA: Diagnosis not present

## 2021-07-12 DIAGNOSIS — D0461 Carcinoma in situ of skin of right upper limb, including shoulder: Secondary | ICD-10-CM | POA: Diagnosis not present

## 2021-07-25 DIAGNOSIS — K50019 Crohn's disease of small intestine with unspecified complications: Secondary | ICD-10-CM | POA: Diagnosis not present

## 2021-08-22 DIAGNOSIS — K50019 Crohn's disease of small intestine with unspecified complications: Secondary | ICD-10-CM | POA: Diagnosis not present

## 2021-08-28 DIAGNOSIS — H7402 Tympanosclerosis, left ear: Secondary | ICD-10-CM | POA: Diagnosis not present

## 2021-08-28 DIAGNOSIS — H938X1 Other specified disorders of right ear: Secondary | ICD-10-CM | POA: Diagnosis not present

## 2021-09-11 ENCOUNTER — Other Ambulatory Visit: Payer: Self-pay | Admitting: Gastroenterology

## 2021-09-11 DIAGNOSIS — K50919 Crohn's disease, unspecified, with unspecified complications: Secondary | ICD-10-CM

## 2021-09-11 DIAGNOSIS — R112 Nausea with vomiting, unspecified: Secondary | ICD-10-CM

## 2021-09-11 DIAGNOSIS — R194 Change in bowel habit: Secondary | ICD-10-CM

## 2021-09-11 DIAGNOSIS — R142 Eructation: Secondary | ICD-10-CM

## 2021-09-12 ENCOUNTER — Other Ambulatory Visit: Payer: Self-pay

## 2021-09-12 ENCOUNTER — Ambulatory Visit
Admission: RE | Admit: 2021-09-12 | Discharge: 2021-09-12 | Disposition: A | Discharge status: Home / Self Care | Payer: BC Managed Care – PPO | Source: Ambulatory Visit | Attending: Gastroenterology | Admitting: Gastroenterology

## 2021-09-12 ENCOUNTER — Encounter: Payer: Self-pay | Admitting: Podiatry

## 2021-09-12 DIAGNOSIS — N281 Cyst of kidney, acquired: Secondary | ICD-10-CM | POA: Diagnosis not present

## 2021-09-12 DIAGNOSIS — K760 Fatty (change of) liver, not elsewhere classified: Secondary | ICD-10-CM | POA: Diagnosis not present

## 2021-09-12 DIAGNOSIS — K50919 Crohn's disease, unspecified, with unspecified complications: Secondary | ICD-10-CM

## 2021-09-12 DIAGNOSIS — K8020 Calculus of gallbladder without cholecystitis without obstruction: Secondary | ICD-10-CM | POA: Diagnosis not present

## 2021-09-12 DIAGNOSIS — R194 Change in bowel habit: Secondary | ICD-10-CM

## 2021-09-12 DIAGNOSIS — R142 Eructation: Secondary | ICD-10-CM

## 2021-09-12 DIAGNOSIS — R112 Nausea with vomiting, unspecified: Secondary | ICD-10-CM

## 2021-09-12 MED ORDER — IOPAMIDOL (ISOVUE-300) INJECTION 61%
100.0000 mL | Freq: Once | INTRAVENOUS | Status: AC | PRN
  Administered 2021-09-12: 100 mL via INTRAVENOUS

## 2021-10-04 DIAGNOSIS — K50919 Crohn's disease, unspecified, with unspecified complications: Secondary | ICD-10-CM | POA: Diagnosis not present

## 2021-10-04 DIAGNOSIS — E559 Vitamin D deficiency, unspecified: Secondary | ICD-10-CM | POA: Diagnosis not present

## 2021-10-04 DIAGNOSIS — E538 Deficiency of other specified B group vitamins: Secondary | ICD-10-CM | POA: Diagnosis not present

## 2021-10-04 DIAGNOSIS — Z125 Encounter for screening for malignant neoplasm of prostate: Secondary | ICD-10-CM | POA: Diagnosis not present

## 2021-10-04 DIAGNOSIS — Z0000 Encounter for general adult medical examination without abnormal findings: Secondary | ICD-10-CM | POA: Diagnosis not present

## 2021-10-04 DIAGNOSIS — E785 Hyperlipidemia, unspecified: Secondary | ICD-10-CM | POA: Diagnosis not present

## 2021-10-09 DIAGNOSIS — Z0000 Encounter for general adult medical examination without abnormal findings: Secondary | ICD-10-CM | POA: Diagnosis not present

## 2021-10-18 DIAGNOSIS — K50019 Crohn's disease of small intestine with unspecified complications: Secondary | ICD-10-CM | POA: Diagnosis not present

## 2021-11-02 DIAGNOSIS — L0882 Omphalitis not of newborn: Secondary | ICD-10-CM | POA: Diagnosis not present

## 2021-11-02 DIAGNOSIS — R1314 Dysphagia, pharyngoesophageal phase: Secondary | ICD-10-CM | POA: Diagnosis not present

## 2021-11-02 DIAGNOSIS — Z9049 Acquired absence of other specified parts of digestive tract: Secondary | ICD-10-CM | POA: Diagnosis not present

## 2021-11-02 DIAGNOSIS — K5000 Crohn's disease of small intestine without complications: Secondary | ICD-10-CM | POA: Diagnosis not present

## 2021-11-09 DIAGNOSIS — N529 Male erectile dysfunction, unspecified: Secondary | ICD-10-CM | POA: Insufficient documentation

## 2021-11-09 DIAGNOSIS — Z9049 Acquired absence of other specified parts of digestive tract: Secondary | ICD-10-CM | POA: Insufficient documentation

## 2021-11-09 DIAGNOSIS — R1310 Dysphagia, unspecified: Secondary | ICD-10-CM | POA: Insufficient documentation

## 2021-11-09 DIAGNOSIS — R198 Other specified symptoms and signs involving the digestive system and abdomen: Secondary | ICD-10-CM | POA: Insufficient documentation

## 2021-11-14 ENCOUNTER — Encounter: Payer: Self-pay | Admitting: Internal Medicine

## 2021-11-14 ENCOUNTER — Ambulatory Visit (INDEPENDENT_AMBULATORY_CARE_PROVIDER_SITE_OTHER): Payer: BC Managed Care – PPO | Admitting: Internal Medicine

## 2021-11-14 ENCOUNTER — Other Ambulatory Visit: Payer: Self-pay

## 2021-11-14 DIAGNOSIS — Z9049 Acquired absence of other specified parts of digestive tract: Secondary | ICD-10-CM | POA: Diagnosis not present

## 2021-11-14 DIAGNOSIS — R1310 Dysphagia, unspecified: Secondary | ICD-10-CM

## 2021-11-14 DIAGNOSIS — R198 Other specified symptoms and signs involving the digestive system and abdomen: Secondary | ICD-10-CM

## 2021-11-14 DIAGNOSIS — N529 Male erectile dysfunction, unspecified: Secondary | ICD-10-CM

## 2021-11-14 NOTE — Progress Notes (Signed)
[facility]  Reason for Consult: Umbilical drainage Referring Provider: Dr. Ronnette Juniper  Assessment: The salicylic acid caused irritation of his umbilicus and he probably developed a superficial infection, most likely with Staph bacteria.  It appears that he is healing with time.  I suggested that he use some topical Neosporin twice daily for the next week.  I doubt that this will lead to any systemic complications even though he is currently on Remicade.  He will follow-up in 1 week   Plan: Topical Neosporin twice daily  Patient Active Problem List   Diagnosis Date Noted   Umbilical discharge 63/14/9702    Priority: High   Dysphagia 11/09/2021   Erectile dysfunction 11/09/2021   History of ear infection 07/21/2020   Sensorineural hearing loss (SNHL), bilateral 05/31/2020   Ileostomy in place [facility]) 03/23/2020   Dysplasia of colon 01/15/2019   Crohn disease ([place])    Crohn's ileitis, with sticture & abscess s/p ileocectomy 04/11/2017 04/06/2017   GERD (gastroesophageal reflux disease) 12/03/2015   Thrombocytopenia ([place]) 12/03/2015    Patient's Medications  New Prescriptions   No medications on file  Previous Medications   ASPIRIN EC 81 MG TABLET    Take 81 mg by mouth daily.   CETIRIZINE (ZYRTEC) 10 MG TABLET    Take 10 mg by mouth daily as needed.    CETIRIZINE HCL 10 MG TBDP    Take by mouth.   CHOLECALCIFEROL (VITAMIN D) 2000 UNITS CAPS    Take 2,000 Units by mouth daily.   CHOLECALCIFEROL 50 MCG (2000 UT) TABS    Take by mouth.   CYANOCOBALAMIN 1000 MCG TABLET    Take by mouth.   CYANOCOBALAMIN PO    Take 1,000 mcg by mouth daily.    FLUTICASONE (FLONASE) 50 MCG/ACT NASAL SPRAY    Place 1 spray into both nostrils daily as needed.    FLUTICASONE (FLONASE) 50 MCG/ACT NASAL SPRAY    Place into the nose.   INFLIXIMAB-AXXQ (AVSOLA) 100 MG INJECTION    See admin instructions.   MELATONIN 10 MG TABS    Take by mouth.   MISC NATURAL PRODUCTS  (GLUCOS-CHONDROIT-MSM COMPLEX PO)    Take 1 tablet by mouth daily.    MULTIPLE VITAMIN (MULTIVITAMIN WITH MINERALS) TABS TABLET    Take 1 tablet by mouth daily.   OMEGA 3 1200 MG CAPS    Take 1,200 mg by mouth daily.   OMEPRAZOLE (PRILOSEC) 20 MG CAPSULE    Take 20 mg by mouth every morning.    OMEPRAZOLE 20 MG TBDD    Take by mouth.   OXYCODONE (OXY IR/ROXICODONE) 5 MG IMMEDIATE RELEASE TABLET    1-2 tabs every 4-6 hrs prn pain   SILDENAFIL (REVATIO) 20 MG TABLET    Take 20 mg by mouth daily as needed (erectile dysfunction).   SULFAMETHOXAZOLE-TRIMETHOPRIM (BACTRIM DS) 800-160 MG TABLET    Take 1 tablet by mouth 2 (two) times daily.   VALACYCLOVIR (VALTREX) 500 MG TABLET    Take 500 mg by mouth daily at 12 noon. midday   VALACYCLOVIR (VALTREX) 500 MG TABLET    Take by mouth.  Modified Medications   No medications on file  Discontinued Medications   HUMIRA PEN 40 MG/0.8ML PNKT    Inject 40 mg into the skin every 14 (fourteen) days.    HPI: Nathan Shaw is a 66 y.o. male with a longstanding history of Crohn's disease who is currently taking  Remicade.  In 2020 he underwent a total colectomy and ileostomy formation.  In June 2021 he underwent ileorectal anastomosis and takedown of his ileostomy.  His surgeries have changed the anatomy around his umbilicus.  He says that he recently got the notion to try to clean inside his navel.  Used a Q-tip soaked in salicylic acid.  The following day he could tell that there was irritation of the umbilicus.  Since that time he has had a few episodes of drainage of pus.  The last occurred about 1 week ago.  It has been scabbed over since that time.  He is not having any unusual pain, swelling or redness around his umbilicus.  He has not had any fever.  Review of Systems: Review of Systems  Constitutional:  Negative for chills, diaphoresis and fever.     Past Medical History:  Diagnosis Date   BPH associated with nocturia    Crohn disease ([place])    followed  by dr Therisa Doyne  [facility] dx 03/ 2010,   treated with Humira and Imuran (hx ileal perforation with abscess/ ileitis crohn's,  s/p  ileocecectomy and small bowel resection 04-11-2017)   History of kidney stones    History of prostatitis    Right ureteral stone    Wears contact lenses    Wears hearing aid in both ears     Social History   Tobacco Use   Smoking status: Former    Packs/day: 1.50    Years: 18.00    Pack years: 27.00    Types: Cigarettes    Quit date: 10/02/2003    Years since quitting: 18.1   Smokeless tobacco: Never  Vaping Use   Vaping Use: Never used  Substance Use Topics   Alcohol use: Yes    Comment: daily 1-2   Drug use: No    Family History  Problem Relation Age of Onset   Crohn's disease Sister    Lung cancer Mother        doing well on Keytruda   Hypertension Neg Hx    Diabetes Mellitus II Neg Hx    No Known Allergies  OBJECTIVE: Vitals:   11/14/21 1607  BP: (!) 144/86  Pulse: 83  Resp: 16  Temp: 98.3 F (36.8 C)  TempSrc: Oral  SpO2: 98%  Weight: 186 lb (84.4 kg)  Height: 5\' 11"  (1.803 m)   Body mass index is 25.94 kg/m.   Physical Exam Constitutional:      Appearance: Normal appearance.     Comments: He is calm and very pleasant.  Cardiovascular:     Rate and Rhythm: Normal rate.  Pulmonary:     Effort: Pulmonary effort is normal.  Abdominal:     Palpations: Abdomen is soft.     Tenderness: There is no abdominal tenderness.     Comments: He has healed midline incisions.  There is a 10 mm scab in the central portion of his umbilicus.  There is no surrounding redness, fluctuance or drainage.  Psychiatric:        Mood and Affect: Mood normal.    Microbiology: No results found for this or any previous visit (from the past 240 hour(s)).  Michel Bickers, MD [facility] [phone] pager   [phone] cell 11/14/2021, 4:21 PM

## 2021-11-14 NOTE — Assessment & Plan Note (Addendum)
The salicylic acid caused irritation of his umbilicus and he probably developed a superficial infection, most likely with Staph bacteria.  It appears that he is healing with time.  I suggested that he use some topical Neosporin twice daily for the next week.  I doubt that this will lead to any systemic complications even though he is currently on Remicade.  He will follow-up in 1 week

## 2021-11-21 ENCOUNTER — Encounter: Payer: Self-pay | Admitting: Internal Medicine

## 2021-11-21 ENCOUNTER — Ambulatory Visit (INDEPENDENT_AMBULATORY_CARE_PROVIDER_SITE_OTHER): Payer: BC Managed Care – PPO | Admitting: Internal Medicine

## 2021-11-21 ENCOUNTER — Other Ambulatory Visit: Payer: Self-pay

## 2021-11-21 DIAGNOSIS — R198 Other specified symptoms and signs involving the digestive system and abdomen: Secondary | ICD-10-CM

## 2021-11-21 NOTE — Progress Notes (Signed)
Virtual Visit via Telephone Note  I connected with Nathan Shaw on 11/21/21 at  1:45 PM EST by telephone and verified that I am speaking with the correct person using two identifiers.  Location: Patient: Home Provider: [facility]   I discussed the limitations, risks, security and privacy concerns of performing an evaluation and management service by telephone and the availability of in person appointments. I also discussed with the patient that there may be a patient responsible charge related to this service. The patient expressed understanding and agreed to proceed.   History of Present Illness: I spoke with her by phone today.  Over the past week he has been cleaning his umbilicus with soap and water then applying a little bit of Neosporin and a Band-Aid.  The small scab came off several days ago.  He has had no further drainage.  The area under the scab remains slightly erythematous but there is no unusual swelling or tenderness.   Observations/Objective:   Assessment and Plan: He developed a mild, superficial infection of his umbilicus after cleaning it with an astringent medication several weeks ago.  He is healing uneventfully now and I do not anticipate any chronic problems.  Follow Up Instructions: He will discontinue Neosporin and another 2 days Follow-up here as needed   I discussed the assessment and treatment plan with the patient. The patient was provided an opportunity to ask questions and all were answered. The patient agreed with the plan and demonstrated an understanding of the instructions.   The patient was advised to call back or seek an in-person evaluation if the symptoms worsen or if the condition fails to improve as anticipated.  I provided 14 minutes of non-face-to-face time during this encounter.   Michel Bickers, MD

## 2021-12-13 DIAGNOSIS — K50019 Crohn's disease of small intestine with unspecified complications: Secondary | ICD-10-CM | POA: Diagnosis not present

## 2022-02-06 DIAGNOSIS — N401 Enlarged prostate with lower urinary tract symptoms: Secondary | ICD-10-CM | POA: Diagnosis not present

## 2022-02-06 DIAGNOSIS — R3121 Asymptomatic microscopic hematuria: Secondary | ICD-10-CM | POA: Diagnosis not present

## 2022-02-06 DIAGNOSIS — R351 Nocturia: Secondary | ICD-10-CM | POA: Diagnosis not present

## 2022-02-06 DIAGNOSIS — R1084 Generalized abdominal pain: Secondary | ICD-10-CM | POA: Diagnosis not present

## 2022-02-07 DIAGNOSIS — K50019 Crohn's disease of small intestine with unspecified complications: Secondary | ICD-10-CM | POA: Diagnosis not present

## 2022-02-20 DIAGNOSIS — R351 Nocturia: Secondary | ICD-10-CM | POA: Diagnosis not present

## 2022-02-20 DIAGNOSIS — N132 Hydronephrosis with renal and ureteral calculous obstruction: Secondary | ICD-10-CM | POA: Diagnosis not present

## 2022-02-20 DIAGNOSIS — N401 Enlarged prostate with lower urinary tract symptoms: Secondary | ICD-10-CM | POA: Diagnosis not present

## 2022-04-04 DIAGNOSIS — K50019 Crohn's disease of small intestine with unspecified complications: Secondary | ICD-10-CM | POA: Diagnosis not present

## 2022-04-05 DIAGNOSIS — K5000 Crohn's disease of small intestine without complications: Secondary | ICD-10-CM | POA: Diagnosis not present

## 2022-04-21 IMAGING — CT CT ABD-PELV W/ CM
2 of 5 series · 14 of 46 positions shown, 16 images · IV contrast (iopamidol)
Comparison: 04/10/2017

CLINICAL DATA: Crohn disease. Nausea and vomiting. Change in bowel
habits. Previous ileocecectomy.

EXAM:
CT ABDOMEN AND PELVIS WITH CONTRAST
TECHNIQUE: Multidetector CT imaging of the abdomen and pelvis was performed
using the standard protocol following bolus administration of
intravenous contrast.
CONTRAST:  100mL [id number] IOPAMIDOL ([id number]) INJECTION 61%

[Series 2: abd pelvis 5.00 br40 s3 axial · axial · 0.67mm/px · z∈[+1183,+1593]mm · 11 of 100 slices shown, 13 images]
[im 9/100  soft-tissue]
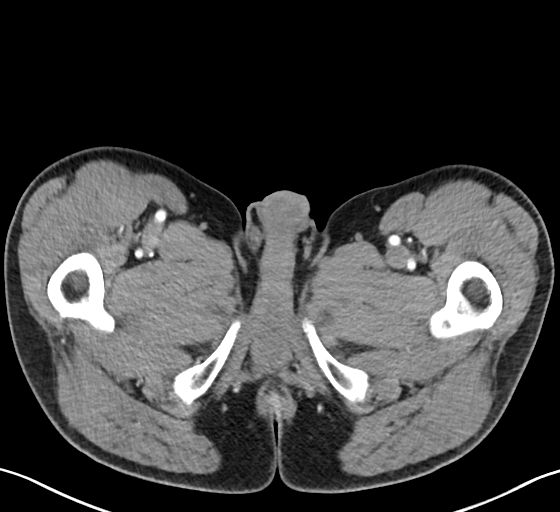
[im 9/100  bone]
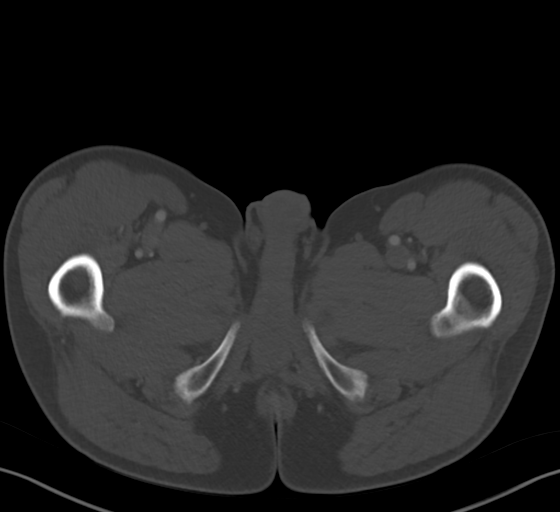
[im 17/100  soft-tissue]
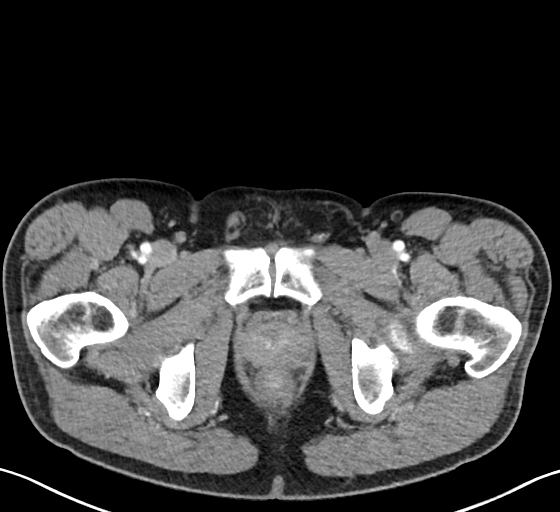
[im 25/100  soft-tissue]
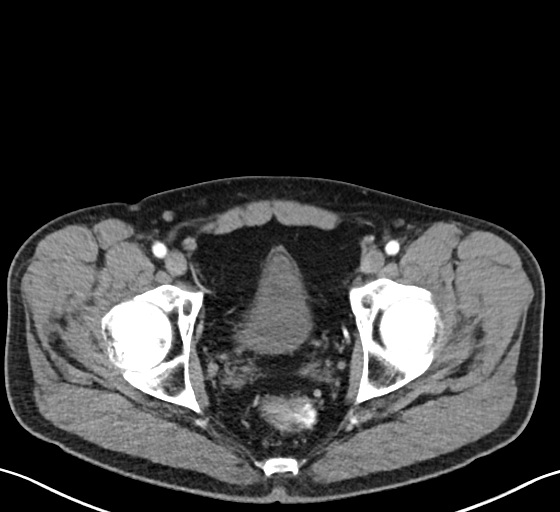
[im 34/100  soft-tissue]
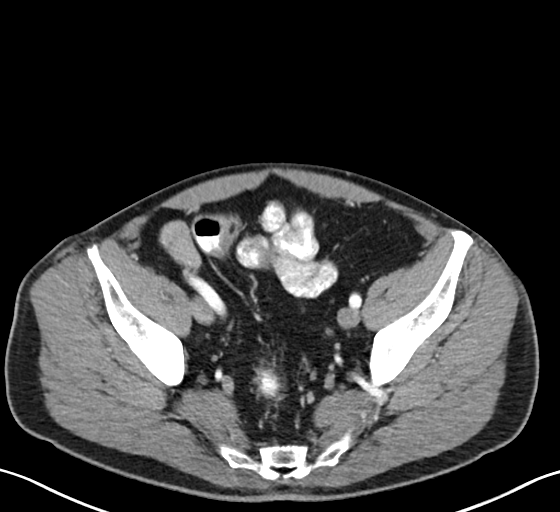
[im 42/100  soft-tissue]
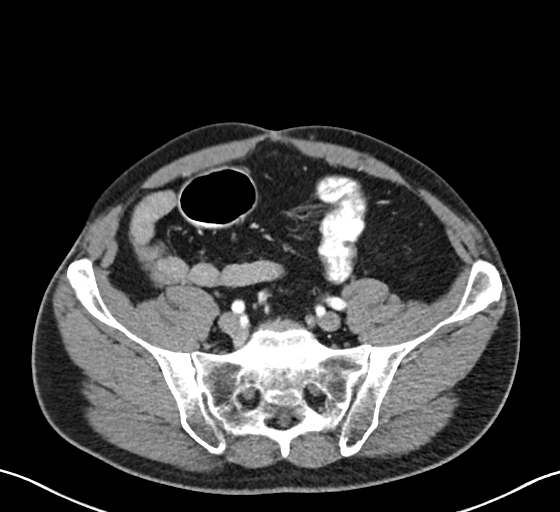
[im 50/100  soft-tissue]
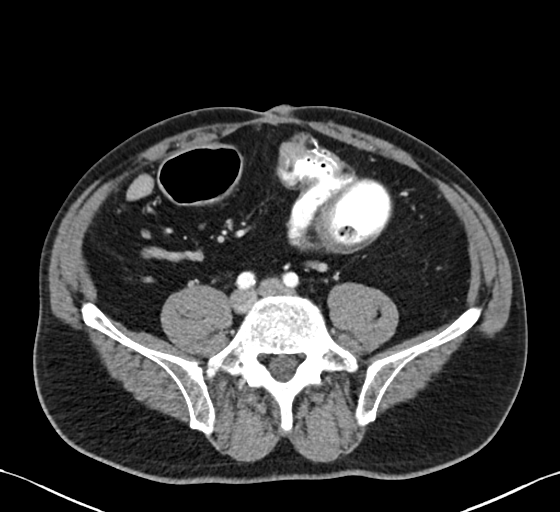
[im 58/100  soft-tissue]
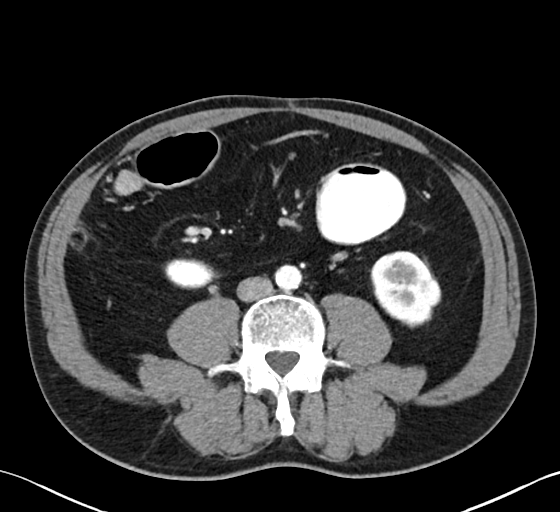
[im 67/100  soft-tissue]
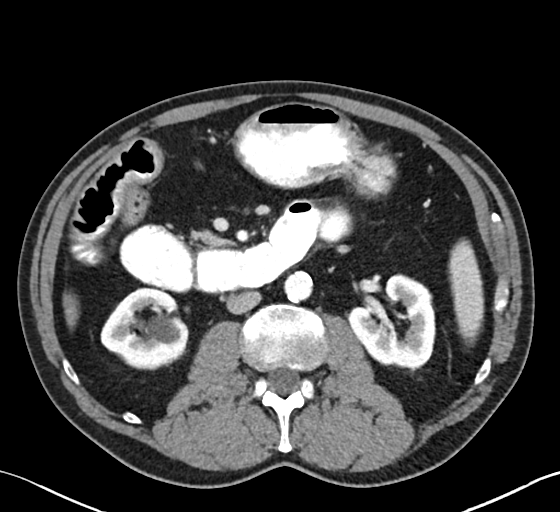
[im 75/100  soft-tissue]
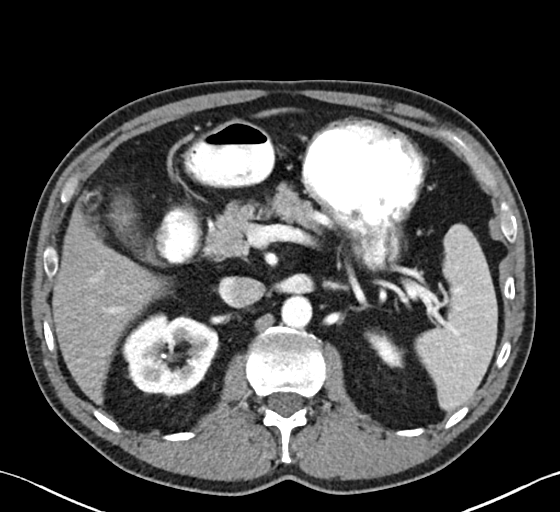
[im 75/100  bone]
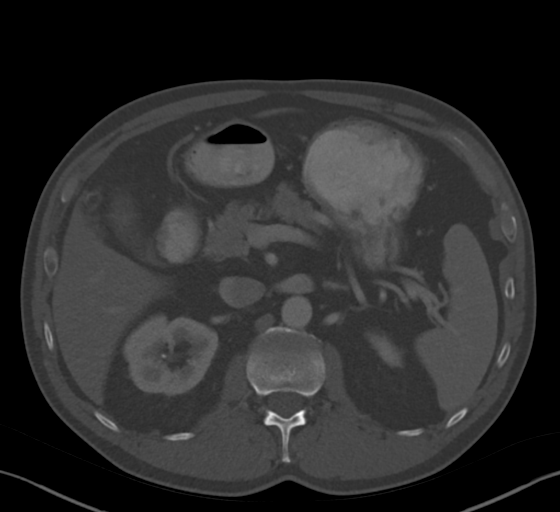
[im 83/100  soft-tissue]
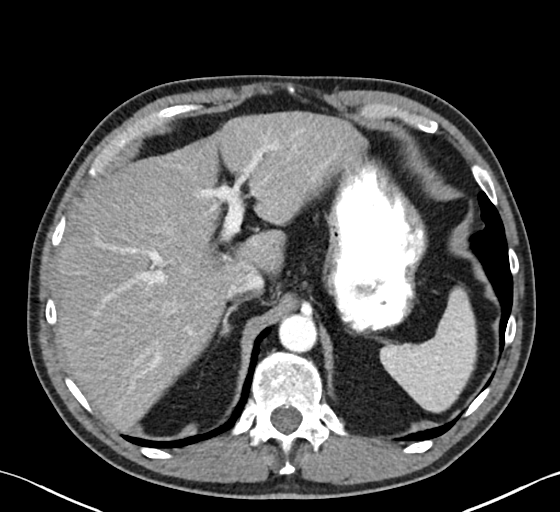
[im 91/100  soft-tissue]
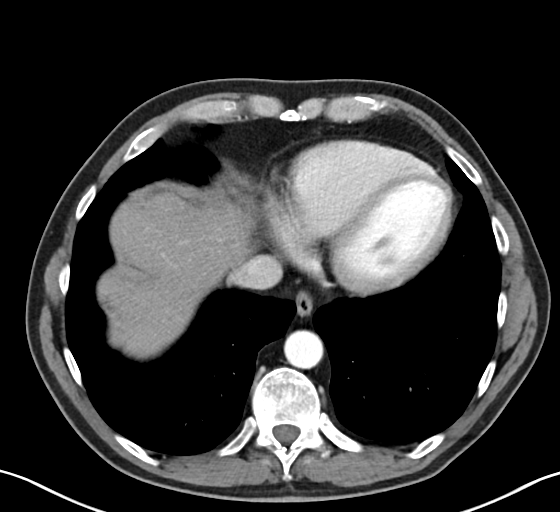

[Series 6: abd pelvis 2.00 br40 s3 cor · coronal · 0.76mm/px · 3 of 160 slices shown]
[im 54/160  soft-tissue]
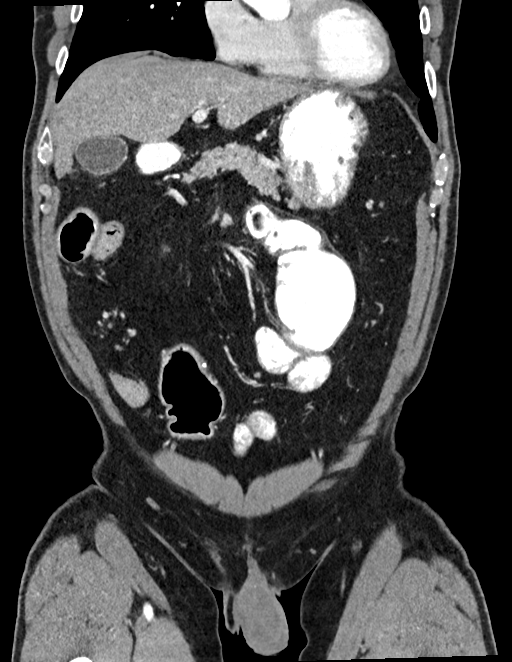
[im 71/160  soft-tissue]
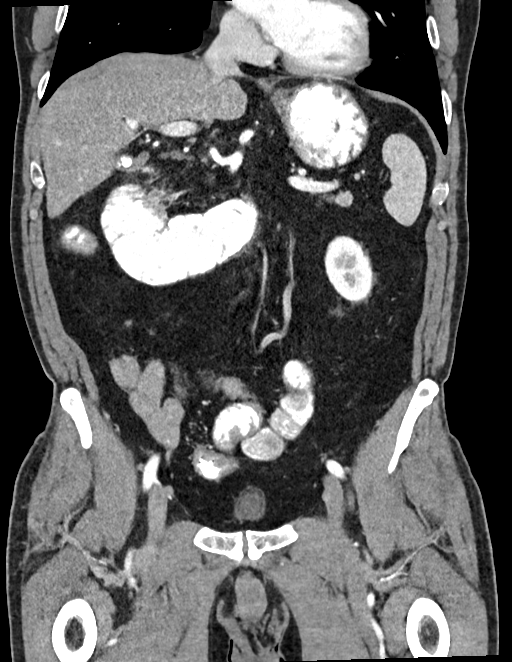
[im 89/160  soft-tissue]
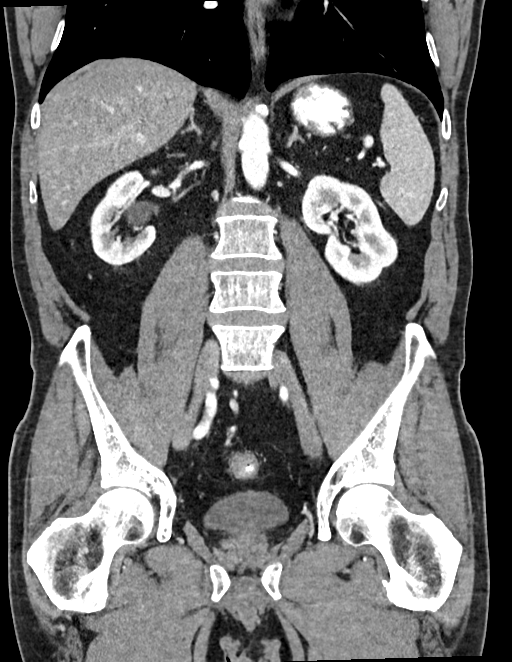

[14 of 46 positions shown; findings below may reference images not displayed]

FINDINGS: Lower Chest: No acute findings.

Hepatobiliary: No hepatic masses identified. Mild diffuse hepatic
steatosis noted. Several tiny less than 1 cm calcified gallstones
are seen, however there is no evidence of cholecystitis or biliary
ductal dilatation.

Pancreas:  No mass or inflammatory changes.

Spleen: Within normal limits in size and appearance.

Adrenals/Urinary Tract: No masses identified. Small bilateral renal
cysts noted. No evidence of ureteral calculi or hydronephrosis.

Stomach/Bowel: Postop changes are seen from subtotal colectomy and
small-bowel resection. No evidence of bowel obstruction, bowel wall
thickening, or other acute inflammatory changes. No evidence of
fistula or abscess.

Vascular/Lymphatic: No pathologically enlarged lymph nodes. No acute
vascular findings. Aortic atherosclerotic calcification noted.

Reproductive:  No mass or other significant abnormality.

Other:  None.

Musculoskeletal:  No suspicious bone lesions identified.
IMPRESSION: Prior subtotal colectomy and small-bowel resection. No evidence of
active Crohn's disease or other acute findings.

Mild hepatic steatosis.

Cholelithiasis. No radiographic evidence of cholecystitis.

Aortic Atherosclerosis ([id number].A).

## 2022-04-25 DIAGNOSIS — S299XXA Unspecified injury of thorax, initial encounter: Secondary | ICD-10-CM | POA: Diagnosis not present

## 2022-05-30 DIAGNOSIS — K50019 Crohn's disease of small intestine with unspecified complications: Secondary | ICD-10-CM | POA: Diagnosis not present

## 2022-06-27 DIAGNOSIS — K6389 Other specified diseases of intestine: Secondary | ICD-10-CM | POA: Diagnosis not present

## 2022-06-27 DIAGNOSIS — K633 Ulcer of intestine: Secondary | ICD-10-CM | POA: Diagnosis not present

## 2022-06-27 DIAGNOSIS — K3189 Other diseases of stomach and duodenum: Secondary | ICD-10-CM | POA: Diagnosis not present

## 2022-06-27 DIAGNOSIS — K626 Ulcer of anus and rectum: Secondary | ICD-10-CM | POA: Diagnosis not present

## 2022-06-27 DIAGNOSIS — K529 Noninfective gastroenteritis and colitis, unspecified: Secondary | ICD-10-CM | POA: Diagnosis not present

## 2022-06-27 DIAGNOSIS — K5289 Other specified noninfective gastroenteritis and colitis: Secondary | ICD-10-CM | POA: Diagnosis not present

## 2022-06-27 DIAGNOSIS — K2289 Other specified disease of esophagus: Secondary | ICD-10-CM | POA: Diagnosis not present

## 2022-06-27 DIAGNOSIS — K5090 Crohn's disease, unspecified, without complications: Secondary | ICD-10-CM | POA: Diagnosis not present

## 2022-06-27 DIAGNOSIS — R1310 Dysphagia, unspecified: Secondary | ICD-10-CM | POA: Diagnosis not present

## 2022-06-27 DIAGNOSIS — K2930 Chronic superficial gastritis without bleeding: Secondary | ICD-10-CM | POA: Diagnosis not present

## 2022-06-27 DIAGNOSIS — K649 Unspecified hemorrhoids: Secondary | ICD-10-CM | POA: Diagnosis not present

## 2022-07-12 DIAGNOSIS — L821 Other seborrheic keratosis: Secondary | ICD-10-CM | POA: Diagnosis not present

## 2022-07-12 DIAGNOSIS — D2239 Melanocytic nevi of other parts of face: Secondary | ICD-10-CM | POA: Diagnosis not present

## 2022-07-12 DIAGNOSIS — L570 Actinic keratosis: Secondary | ICD-10-CM | POA: Diagnosis not present

## 2022-07-12 DIAGNOSIS — D225 Melanocytic nevi of trunk: Secondary | ICD-10-CM | POA: Diagnosis not present

## 2022-07-12 DIAGNOSIS — L720 Epidermal cyst: Secondary | ICD-10-CM | POA: Diagnosis not present

## 2022-07-25 DIAGNOSIS — K50019 Crohn's disease of small intestine with unspecified complications: Secondary | ICD-10-CM | POA: Diagnosis not present

## 2022-09-12 DIAGNOSIS — H04123 Dry eye syndrome of bilateral lacrimal glands: Secondary | ICD-10-CM | POA: Diagnosis not present

## 2022-09-12 DIAGNOSIS — H35373 Puckering of macula, bilateral: Secondary | ICD-10-CM | POA: Diagnosis not present

## 2022-09-12 DIAGNOSIS — H524 Presbyopia: Secondary | ICD-10-CM | POA: Diagnosis not present

## 2022-09-12 DIAGNOSIS — H40013 Open angle with borderline findings, low risk, bilateral: Secondary | ICD-10-CM | POA: Diagnosis not present

## 2022-09-12 DIAGNOSIS — H2513 Age-related nuclear cataract, bilateral: Secondary | ICD-10-CM | POA: Diagnosis not present

## 2022-09-13 DIAGNOSIS — K50019 Crohn's disease of small intestine with unspecified complications: Secondary | ICD-10-CM | POA: Diagnosis not present

## 2022-10-17 DIAGNOSIS — Z0000 Encounter for general adult medical examination without abnormal findings: Secondary | ICD-10-CM | POA: Diagnosis not present

## 2022-10-17 DIAGNOSIS — E291 Testicular hypofunction: Secondary | ICD-10-CM | POA: Diagnosis not present

## 2022-10-17 DIAGNOSIS — N529 Male erectile dysfunction, unspecified: Secondary | ICD-10-CM | POA: Diagnosis not present

## 2022-10-17 DIAGNOSIS — Z125 Encounter for screening for malignant neoplasm of prostate: Secondary | ICD-10-CM | POA: Diagnosis not present

## 2022-10-17 DIAGNOSIS — K50919 Crohn's disease, unspecified, with unspecified complications: Secondary | ICD-10-CM | POA: Diagnosis not present

## 2022-10-17 DIAGNOSIS — E785 Hyperlipidemia, unspecified: Secondary | ICD-10-CM | POA: Diagnosis not present

## 2022-10-17 DIAGNOSIS — N400 Enlarged prostate without lower urinary tract symptoms: Secondary | ICD-10-CM | POA: Diagnosis not present

## 2022-10-17 DIAGNOSIS — A6000 Herpesviral infection of urogenital system, unspecified: Secondary | ICD-10-CM | POA: Diagnosis not present

## 2022-10-17 DIAGNOSIS — M8580 Other specified disorders of bone density and structure, unspecified site: Secondary | ICD-10-CM | POA: Diagnosis not present

## 2022-10-22 DIAGNOSIS — K50019 Crohn's disease of small intestine with unspecified complications: Secondary | ICD-10-CM | POA: Diagnosis not present

## 2022-11-08 DIAGNOSIS — K50019 Crohn's disease of small intestine with unspecified complications: Secondary | ICD-10-CM | POA: Diagnosis not present

## 2023-01-07 DIAGNOSIS — K50019 Crohn's disease of small intestine with unspecified complications: Secondary | ICD-10-CM | POA: Diagnosis not present

## 2023-01-09 DIAGNOSIS — K50019 Crohn's disease of small intestine with unspecified complications: Secondary | ICD-10-CM | POA: Diagnosis not present

## 2023-03-04 DIAGNOSIS — K50019 Crohn's disease of small intestine with unspecified complications: Secondary | ICD-10-CM | POA: Diagnosis not present

## 2023-03-13 DIAGNOSIS — K50019 Crohn's disease of small intestine with unspecified complications: Secondary | ICD-10-CM | POA: Diagnosis not present

## 2023-04-30 ENCOUNTER — Other Ambulatory Visit: Payer: Self-pay | Admitting: Gastroenterology

## 2023-04-30 DIAGNOSIS — K50918 Crohn's disease, unspecified, with other complication: Secondary | ICD-10-CM

## 2023-04-30 DIAGNOSIS — R634 Abnormal weight loss: Secondary | ICD-10-CM

## 2023-05-06 ENCOUNTER — Ambulatory Visit
Admission: RE | Admit: 2023-05-06 | Discharge: 2023-05-06 | Disposition: A | Discharge status: Home / Self Care | Payer: Medicare Other | Source: Ambulatory Visit | Attending: Gastroenterology | Admitting: Gastroenterology

## 2023-05-06 DIAGNOSIS — R634 Abnormal weight loss: Secondary | ICD-10-CM

## 2023-05-06 DIAGNOSIS — K50918 Crohn's disease, unspecified, with other complication: Secondary | ICD-10-CM

## 2023-05-06 MED ORDER — IOPAMIDOL (ISOVUE-300) INJECTION 61%
100.0000 mL | Freq: Once | INTRAVENOUS | Status: AC | PRN
  Administered 2023-05-06: 100 mL via INTRAVENOUS

## 2023-05-07 ENCOUNTER — Other Ambulatory Visit: Payer: BC Managed Care – PPO

## 2023-05-15 ENCOUNTER — Other Ambulatory Visit: Payer: BC Managed Care – PPO

## 2023-05-27 ENCOUNTER — Other Ambulatory Visit: Payer: Self-pay | Admitting: Medical Genetics

## 2023-05-27 DIAGNOSIS — Z006 Encounter for examination for normal comparison and control in clinical research program: Secondary | ICD-10-CM

## 2023-05-31 ENCOUNTER — Other Ambulatory Visit (HOSPITAL_COMMUNITY)
Admission: RE | Admit: 2023-05-31 | Discharge: 2023-05-31 | Disposition: A | Discharge status: Home / Self Care | Payer: Medicare Other | Attending: Oncology | Admitting: Oncology

## 2023-05-31 DIAGNOSIS — Z006 Encounter for examination for normal comparison and control in clinical research program: Secondary | ICD-10-CM

## 2023-06-11 LAB — GENECONNECT MOLECULAR SCREEN: Genetic Analysis Overall Interpretation: NEGATIVE

## 2023-10-25 ENCOUNTER — Ambulatory Visit: Payer: Self-pay | Admitting: Surgery

## 2023-10-25 NOTE — H&P (Signed)
Subjective   Chief Complaint: New Consultation     History of Present Illness: Nathan Shaw is a 68 y.o. male who is seen today as an office consultation at the request of Dr. Marca Ancona for evaluation of New Consultation .   This is a 68 year old male with Crohn's disease status post multiple previous abdominal surgeries including a subtotal colectomy with ileorectal anastomosis performed at [facility] in 2021.  His ileostomy has been reversed and converted to ileocolic anastomosis.  The patient has had known gallstones for several years.  However recently, he has noticed more frequent intermittent right upper quadrant abdominal pain.  His symptoms are still relatively mild but he has noticed increase in frequency.  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  He is on Remicade every other month as well as azathioprine for his Crohn's disease.  He recently had a liver function test performed that were normal.  CT scan revealed cholelithiasis with no sign of acute cholecystitis.  He presents now to discuss elective cholecystectomy.  Review of Systems: A complete review of systems was obtained from the patient.  I have reviewed this information and discussed as appropriate with the patient.  See HPI as well for other ROS.  Review of Systems  Constitutional: Negative.   HENT:  Positive for hearing loss.   Eyes: Negative.   Respiratory: Negative.    Cardiovascular: Negative.   Gastrointestinal:  Positive for abdominal pain, diarrhea and heartburn.  Genitourinary: Negative.   Musculoskeletal: Negative.   Skin: Negative.   Neurological: Negative.   Endo/Heme/Allergies: Negative.   Psychiatric/Behavioral: Negative.        Medical History: Past Medical History:  Diagnosis Date   GERD (gastroesophageal reflux disease)     Patient Active Problem List  Diagnosis   Calculus of gallbladder with chronic cholecystitis without obstruction   Crohn's disease  (CMS/HHS-HCC)   Dysplasia of colon   Erectile dysfunction   GERD (gastroesophageal reflux disease)   Sensorineural hearing loss (SNHL), bilateral   Thrombocytopenia (CMS-HCC)    Past Surgical History:  Procedure Laterality Date    Cystoscopy with retrograde pyelogram, ureteroscopy and stent placement (Right      CORE NEEDLE BIOPSY LUNG/MEDIASTINUM PERQ     ENDOSCOPIC CARPAL TUNNEL RELEASE Right    Holmium laser application      Laparoscopic ileocecectomy       No Known Allergies  Current Outpatient Medications on File Prior to Visit  Medication Sig Dispense Refill   aspirin 81 MG EC tablet Take 81 mg by mouth once daily     cetirizine (ZYRTEC) 10 MG tablet Take 10 mg by mouth     cholecalciferol (VITAMIN D3) 2,000 unit capsule Take 1 capsule by mouth once daily     cyanocobalamin (VITAMIN B12) 1000 MCG tablet Take by mouth     fluticasone propionate (FLONASE) 50 mcg/actuation nasal spray Place 1 spray into one nostril     inFLIXimab-axxq (AVSOLA) 100 mg SolR injection SEE ADMIN INSTRUCTIONS     melatonin 10 mg Tab Take 10 mg by mouth     omega-3 fatty acids-fish oil (ONE-PER-DAY OMEGA-3) 684-1,200 mg CpDR Take 1,200 mg by mouth once daily     omeprazole (PRILOSEC) 20 MG DR capsule Take 20 mg by mouth every morning     sildenafil (REVATIO) 20 mg tablet Take 20 mg by mouth     tamsulosin (FLOMAX) 0.4 mg capsule TAKE 1 CAPSULE BY MOUTH EVERY DAY Oral for 90  Days     valACYclovir (VALTREX) 500 MG tablet 1 tablet Orally Once a day     No current facility-administered medications on file prior to visit.    History reviewed. No pertinent family history.   Social History   Tobacco Use  Smoking Status Former   Types: Cigarettes   Start date: 1995  Smokeless Tobacco Never     Social History   Socioeconomic History   Marital status: Life Partner  Tobacco Use   Smoking status: Former    Types: Cigarettes    Start date: 1995   Smokeless tobacco: Never  Substance and Sexual  Activity   Alcohol use: Yes   Drug use: Never   Social Drivers of Architectural technologist Insecurity: No Food Insecurity (03/24/2020)   Received from [facility]   Hunger Vital Sign    Worried About Running Out of Food in the Last Year: Never true    Ran Out of Food in the Last Year: Never true  Housing Stability: Unknown (10/25/2023)   Housing Stability Vital Sign    Homeless in the Last Year: No    Objective:    Vitals:   10/25/23 0917 10/25/23 0918  BP: (!) 142/78   Pulse: 66   Temp: 36.5 C (97.7 F)   SpO2: 98%   Weight: 81.1 kg (178 lb 12.8 oz)   Height: 180.3 cm (5\' 11" )   PainSc:  0-No pain    Body mass index is 24.94 kg/m.  Physical Exam   Constitutional:  WDWN in NAD, conversant, no obvious deformities; lying in bed comfortably Eyes:  Pupils equal, round; sclera anicteric; moist conjunctiva; no lid lag HENT:  Oral mucosa moist; good dentition  Neck:  No masses palpated, trachea midline; no thyromegaly Lungs:  CTA bilaterally; normal respiratory effort CV:  Regular rate and rhythm; no murmurs; extremities well-perfused with no edema Abd:  +bowel sounds, soft, non-tender, no palpable organomegaly; no palpable hernias; well-healed midline incision extending just above the umbilicus Musc: Normal gait; no apparent clubbing or cyanosis in extremities Lymphatic:  No palpable cervical or axillary lymphadenopathy Skin:  Warm, dry; no sign of jaundice Psychiatric - alert and oriented x 4; calm mood and affect   Labs, Imaging and Diagnostic Testing: CLINICAL DATA:  Crohn disease.  Weight loss.   EXAM: CT ABDOMEN AND PELVIS WITH CONTRAST   TECHNIQUE: Multidetector CT imaging of the abdomen and pelvis was performed using the standard protocol following bolus administration of intravenous contrast.   RADIATION DOSE REDUCTION: This exam was performed according to the departmental dose-optimization program which includes automated exposure control, adjustment of the mA  and/or kV according to patient size and/or use of iterative reconstruction technique.   CONTRAST:  ISOVUE-300 IOPAMIDOL (ISOVUE-300) INJECTION 61%   COMPARISON:  09/12/2021   FINDINGS: Lower chest: Unremarkable.   Hepatobiliary: No suspicious focal abnormality within the liver parenchyma. Multiple calcified gallstones evident measuring up to 13 x 10 mm. No gallbladder wall thickening or pericholecystic fluid. No intrahepatic or extrahepatic biliary dilation.   Pancreas: No focal mass lesion. No dilatation of the main duct. No intraparenchymal cyst. No peripancreatic edema.   Spleen: No splenomegaly. No suspicious focal mass lesion.   Adrenals/Urinary Tract: No adrenal nodule or mass. Stable small cyst lower pole right kidney. No followup imaging is recommended. Left kidney unremarkable. No evidence for hydroureter. The urinary bladder appears normal for the degree of distention.   Stomach/Bowel: Stomach is unremarkable. No gastric wall thickening. No evidence  of outlet obstruction. Duodenum is normally positioned as is the ligament of Treitz. No small bowel wall thickening. No small bowel dilatation. Small bowel anastomosis identified left abdomen. Status post subtotal colectomy with ileo colic anastomosis in the anterior pelvis. There is some mild circumferential wall thickening in the distal small bowel just proximal to the anastomosis, but there is no associated perienteric edema or inflammation. Upper normal lymph nodes in the right mesentery are similar to prior.   Vascular/Lymphatic: There is mild atherosclerotic calcification of the abdominal aorta without aneurysm. There is no gastrohepatic or hepatoduodenal ligament lymphadenopathy. No retroperitoneal or mesenteric lymphadenopathy. No pelvic sidewall lymphadenopathy.   Reproductive: The prostate gland and seminal vesicles are unremarkable.   Other: No intraperitoneal free fluid.   Musculoskeletal: No  worrisome lytic or sclerotic osseous abnormality.   IMPRESSION: 1. Status post subtotal colectomy with ileo colic anastomosis in the anterior pelvis. There is some mild circumferential wall thickening in the distal small bowel just proximal to the anastomosis, but there is no associated perienteric edema or inflammation. No evidence for inflammatory or fibrous small bowel stricture. 2. Cholelithiasis without cholecystitis. 3.  Aortic Atherosclerosis (ICD10-I70.0).     Electronically Signed   By: Kennith Center M.D.   On: 05/11/2023 07:39  Assessment and Plan:  Diagnoses and all orders for this visit:  Calculus of gallbladder with chronic cholecystitis without obstruction  Crohn's disease of both small and large intestine without complication (CMS/HHS-HCC)    Recommend laparoscopic cholecystectomy with intraoperative cholangiogram.  Surgery will be slightly complicated by his previous abdominal surgery due to scar tissue.  However his midline incision only extends slightly above his umbilicus.  The surgical procedure has been discussed with the patient.  Potential risks, benefits, alternative treatments, and expected outcomes have been explained.  All of the patient's questions at this time have been answered.  The likelihood of reaching the patient's treatment goal is good.  The patient understands the proposed surgical procedure and wishes to proceed.   Nyah Shepherd Delbert Harness, MD  10/25/2023 10:02 AM

## 2023-11-01 NOTE — Patient Instructions (Addendum)
SURGICAL WAITING ROOM VISITATION  Patients having surgery or a procedure may have no more than 2 support people in the waiting area - these visitors may rotate.    Children under the age of 65 must have an adult with them who is not the patient.  Due to an increase in RSV and influenza rates and associated hospitalizations, children ages 28 and under may not visit patients in [facility] hospitals.  Visitors with respiratory illnesses are discouraged from visiting and should remain at home.  If the patient needs to stay at the hospital during part of their recovery, the visitor guidelines for inpatient rooms apply. Pre-op nurse will coordinate an appropriate time for 1 support person to accompany patient in pre-op.  This support person may not rotate.    Please refer to the [facility] website for the visitor guidelines for Inpatients (after your surgery is over and you are in a regular room).       Your procedure is scheduled on: 11/07/23   Report to [facility] Main Entrance    Report to admitting at  5:45 AM   Call this number if you have problems the morning of surgery [phone]   Do not eat food :After Midnight.   After Midnight you may have the following liquids until 4:30 AM DAY OF SURGERY  Water Non-Citrus Juices (without pulp, NO RED-Apple, White grape, White cranberry) Black Coffee (NO MILK/CREAM OR CREAMERS, sugar ok)  Clear Tea (NO MILK/CREAM OR CREAMERS, sugar ok) regular and decaf                             Plain Jell-O (NO RED)                                           Fruit ices (not with fruit pulp, NO RED)                                     Popsicles (NO RED)                                                               Sports drinks like Gatorade (NO RED)                  Oral Hygiene is also important to reduce your risk of infection.                                    Remember - BRUSH YOUR TEETH THE MORNING OF SURGERY WITH YOUR REGULAR  TOOTHPASTE   Stop all vitamins and herbal supplements 7 days before surgery.   Take these medicines the morning of surgery with A SIP OF WATER: atorvastatin, Cetirizine, omeprazole and tamsulosin.             You may not have any metal on your body including hair pins, jewelry, and body piercing             Do not  wear make-up, lotions, powders, perfumes/cologne, or deodorant              Men may shave face and neck.   Do not bring valuables to the hospital. [facility] IS NOT             RESPONSIBLE   FOR VALUABLES.   Contacts, glasses, dentures or bridgework may not be worn into surgery.  DO NOT BRING YOUR HOME MEDICATIONS TO THE HOSPITAL. PHARMACY WILL DISPENSE MEDICATIONS LISTED ON YOUR MEDICATION LIST TO YOU DURING YOUR ADMISSION IN THE HOSPITAL!    Patients discharged on the day of surgery will not be allowed to drive home.  Someone NEEDS to stay with you for the first 24 hours after anesthesia.   Special Instructions: Bring a copy of your healthcare power of attorney and living will documents the day of surgery if you haven't scanned them before.              Please read over the following fact sheets you were given: IF YOU HAVE QUESTIONS ABOUT YOUR PRE-OP INSTRUCTIONS PLEASE CALL [phone] Rosey Bath   If you received a COVID test during your pre-op visit  it is requested that you wear a mask when out in public, stay away from anyone that may not be feeling well and notify your surgeon if you develop symptoms. If you test positive for Covid or have been in contact with anyone that has tested positive in the last 10 days please notify you surgeon.    [place] - Preparing for Surgery Before surgery, you can play an important role.  Because skin is not sterile, your skin needs to be as free of germs as possible.  You can reduce the number of germs on your skin by washing with CHG (chlorahexidine gluconate) soap before surgery.  CHG is an antiseptic cleaner which kills germs and  bonds with the skin to continue killing germs even after washing. Please DO NOT use if you have an allergy to CHG or antibacterial soaps.  If your skin becomes reddened/irritated stop using the CHG and inform your nurse when you arrive at Short Stay. Do not shave (including legs and underarms) for at least 48 hours prior to the first CHG shower.  You may shave your face/neck.  Please follow these instructions carefully:  1.  Shower with CHG Soap the night before surgery and the  morning of surgery.  2.  If you choose to wash your hair, wash your hair first as usual with your normal  shampoo.  3.  After you shampoo, rinse your hair and body thoroughly to remove the shampoo.                             4.  Use CHG as you would any other liquid soap.  You can apply chg directly to the skin and wash.  Gently with a scrungie or clean washcloth.  5.  Apply the CHG Soap to your body ONLY FROM THE NECK DOWN.   Do   not use on face/ open                           Wound or open sores. Avoid contact with eyes, ears mouth and   genitals (private parts).                       Wash  face,  Genitals (private parts) with your normal soap.             6.  Wash thoroughly, paying special attention to the area where your    surgery  will be performed.  7.  Thoroughly rinse your body with warm water from the neck down.  8.  DO NOT shower/wash with your normal soap after using and rinsing off the CHG Soap.                9.  Pat yourself dry with a clean towel.            10.  Wear clean pajamas.            11.  Place clean sheets on your bed the night of your first shower and do not  sleep with pets. Day of Surgery : Do not apply any lotions/deodorants the morning of surgery.  Please wear clean clothes to the hospital/surgery center.  FAILURE TO FOLLOW THESE INSTRUCTIONS MAY RESULT IN THE CANCELLATION OF YOUR SURGERY  PATIENT SIGNATURE_________________________________  NURSE  SIGNATURE__________________________________  ________________________________________________________________________

## 2023-11-01 NOTE — Progress Notes (Signed)
COVID Vaccine received:  []  No [x]  Yes Date of any COVID positive Test in last 90 days: no PCP - Peri Maris FNP Cardiologist - n/a  Chest x-ray -  EKG -  Jan. 2025 Epic Stress Test -  ECHO -  Cardiac Cath -   Bowel Prep - [x]  No  []   Yes ______  Pacemaker / ICD device [x]  No []  Yes   Spinal Cord Stimulator:[x]  No []  Yes       History of Sleep Apnea? [x]  No []  Yes   CPAP used?- [x]  No []  Yes    Does the patient monitor blood sugar?          [x]  No []  Yes  []  N/A  Patient has: [x]  NO Hx DM   []  Pre-DM                 []  DM1  []   DM2 Does patient have a [employer] or Dexacom? []  No []  Yes   Fasting Blood Sugar Ranges-  Checks Blood Sugar _____ times a day  GLP1 agonist / usual dose - no GLP1 instructions:  SGLT-2 inhibitors / usual dose - no SGLT-2 instructions:   Blood Thinner / Instructions:no Aspirin Instructions:ASA 81mg  -Last dose 11/01/23  Comments:   Activity level: Patient is able to climb a flight of stairs without difficulty; [x]  No CP  [x]  No SOB,    Patient can perform ADLs without assistance.   Anesthesia review:   Patient denies shortness of breath, fever, cough and chest pain at PAT appointment.  Patient verbalized understanding and agreement to the Pre-Surgical Instructions that were given to them at this PAT appointment. Patient was also educated of the need to review these PAT instructions again prior to his/her surgery.I reviewed the appropriate phone numbers to call if they have any and questions or concerns.

## 2023-11-04 ENCOUNTER — Other Ambulatory Visit: Payer: Self-pay

## 2023-11-04 ENCOUNTER — Encounter (HOSPITAL_COMMUNITY): Payer: Self-pay

## 2023-11-04 ENCOUNTER — Encounter (HOSPITAL_COMMUNITY)
Admission: RE | Admit: 2023-11-04 | Discharge: 2023-11-04 | Disposition: A | Discharge status: Home / Self Care | Payer: Medicare Other | Source: Ambulatory Visit | Attending: Surgery | Admitting: Surgery

## 2023-11-04 DIAGNOSIS — Z01812 Encounter for preprocedural laboratory examination: Secondary | ICD-10-CM | POA: Insufficient documentation

## 2023-11-04 HISTORY — DX: Cardiac arrhythmia, unspecified: I49.9

## 2023-11-04 HISTORY — DX: Gastro-esophageal reflux disease without esophagitis: K21.9

## 2023-11-07 ENCOUNTER — Ambulatory Visit (HOSPITAL_COMMUNITY)
Admission: RE | Admit: 2023-11-07 | Discharge: 2023-11-07 | Disposition: A | Discharge status: Home / Self Care | Payer: Medicare Other | Source: Ambulatory Visit | Attending: Surgery | Admitting: Surgery

## 2023-11-07 ENCOUNTER — Ambulatory Visit (HOSPITAL_COMMUNITY): Payer: Medicare Other | Admitting: Anesthesiology

## 2023-11-07 ENCOUNTER — Ambulatory Visit (HOSPITAL_BASED_OUTPATIENT_CLINIC_OR_DEPARTMENT_OTHER): Payer: Medicare Other | Admitting: Anesthesiology

## 2023-11-07 ENCOUNTER — Encounter (HOSPITAL_COMMUNITY)
Admission: RE | Disposition: A | Discharge status: Home / Self Care | Payer: Self-pay | Source: Ambulatory Visit | Attending: Surgery

## 2023-11-07 ENCOUNTER — Ambulatory Visit (HOSPITAL_COMMUNITY): Payer: Medicare Other

## 2023-11-07 ENCOUNTER — Encounter (HOSPITAL_COMMUNITY): Payer: Self-pay | Admitting: Surgery

## 2023-11-07 ENCOUNTER — Other Ambulatory Visit: Payer: Self-pay

## 2023-11-07 DIAGNOSIS — Z87891 Personal history of nicotine dependence: Secondary | ICD-10-CM | POA: Insufficient documentation

## 2023-11-07 DIAGNOSIS — Z79620 Long term (current) use of immunosuppressive biologic: Secondary | ICD-10-CM | POA: Insufficient documentation

## 2023-11-07 DIAGNOSIS — K8010 Calculus of gallbladder with chronic cholecystitis without obstruction: Secondary | ICD-10-CM

## 2023-11-07 DIAGNOSIS — K5080 Crohn's disease of both small and large intestine without complications: Secondary | ICD-10-CM | POA: Diagnosis present

## 2023-11-07 DIAGNOSIS — Z79624 Long term (current) use of inhibitors of nucleotide synthesis: Secondary | ICD-10-CM | POA: Diagnosis not present

## 2023-11-07 HISTORY — PX: CHOLECYSTECTOMY: SHX55

## 2023-11-07 SURGERY — LAPAROSCOPIC CHOLECYSTECTOMY WITH INTRAOPERATIVE CHOLANGIOGRAM
Anesthesia: General

## 2023-11-07 MED ORDER — OXYCODONE HCL 5 MG/5ML PO SOLN
5.0000 mg | Freq: Once | ORAL | Status: AC | PRN
  Administered 2023-11-07: 5 mg via ORAL

## 2023-11-07 MED ORDER — SODIUM CHLORIDE 0.9 % IV SOLN
INTRAVENOUS | Status: DC | PRN
  Administered 2023-11-07: 15 mL

## 2023-11-07 MED ORDER — ORAL CARE MOUTH RINSE: 15.0000 mL | Freq: Once | OROMUCOSAL | Status: AC

## 2023-11-07 MED ORDER — HEMOSTATIC AGENTS (NO CHARGE) OPTIME
TOPICAL | Status: DC | PRN
  Administered 2023-11-07: 1 via TOPICAL

## 2023-11-07 MED ORDER — FENTANYL CITRATE (PF) 250 MCG/5ML IJ SOLN
INTRAMUSCULAR | Status: AC
  Filled 2023-11-07: qty 5

## 2023-11-07 MED ORDER — SUGAMMADEX SODIUM 200 MG/2ML IV SOLN
INTRAVENOUS | Status: DC | PRN
  Administered 2023-11-07: 200 mg via INTRAVENOUS

## 2023-11-07 MED ORDER — PROPOFOL 10 MG/ML IV BOLUS
INTRAVENOUS | Status: DC | PRN
  Administered 2023-11-07: 200 mg via INTRAVENOUS

## 2023-11-07 MED ORDER — OXYCODONE HCL 5 MG PO TABS: 5.0000 mg | ORAL_TABLET | Freq: Four times a day (QID) | ORAL | 0 refills | Status: AC | PRN

## 2023-11-07 MED ORDER — ONDANSETRON HCL 4 MG/2ML IJ SOLN
INTRAMUSCULAR | Status: AC
  Filled 2023-11-07: qty 2

## 2023-11-07 MED ORDER — BUPIVACAINE-EPINEPHRINE 0.25% -1:200000 IJ SOLN
INTRAMUSCULAR | Status: AC
  Filled 2023-11-07: qty 1

## 2023-11-07 MED ORDER — KETAMINE HCL 50 MG/5ML IJ SOSY
PREFILLED_SYRINGE | INTRAMUSCULAR | Status: AC
  Filled 2023-11-07: qty 5

## 2023-11-07 MED ORDER — LIDOCAINE HCL (PF) 2 % IJ SOLN
INTRAMUSCULAR | Status: AC
  Filled 2023-11-07: qty 5

## 2023-11-07 MED ORDER — ONDANSETRON HCL 4 MG/2ML IJ SOLN
INTRAMUSCULAR | Status: DC | PRN
  Administered 2023-11-07: 4 mg via INTRAVENOUS

## 2023-11-07 MED ORDER — CHLORHEXIDINE GLUCONATE CLOTH 2 % EX PADS: 6.0000 | MEDICATED_PAD | Freq: Once | CUTANEOUS | Status: DC

## 2023-11-07 MED ORDER — DEXAMETHASONE SODIUM PHOSPHATE 10 MG/ML IJ SOLN
INTRAMUSCULAR | Status: AC
  Filled 2023-11-07: qty 1

## 2023-11-07 MED ORDER — CHLORHEXIDINE GLUCONATE 0.12 % MT SOLN
15.0000 mL | Freq: Once | OROMUCOSAL | Status: AC
  Administered 2023-11-07: 15 mL via OROMUCOSAL

## 2023-11-07 MED ORDER — OXYCODONE HCL 5 MG PO TABS: 5.0000 mg | ORAL_TABLET | Freq: Once | ORAL | Status: AC | PRN

## 2023-11-07 MED ORDER — ROCURONIUM BROMIDE 10 MG/ML (PF) SYRINGE
PREFILLED_SYRINGE | INTRAVENOUS | Status: AC
  Filled 2023-11-07: qty 10

## 2023-11-07 MED ORDER — MIDAZOLAM HCL 2 MG/2ML IJ SOLN
INTRAMUSCULAR | Status: AC
  Filled 2023-11-07: qty 2

## 2023-11-07 MED ORDER — OXYCODONE HCL 5 MG/5ML PO SOLN
ORAL | Status: AC
  Filled 2023-11-07: qty 5

## 2023-11-07 MED ORDER — PROPOFOL 10 MG/ML IV BOLUS
INTRAVENOUS | Status: AC
  Filled 2023-11-07: qty 20

## 2023-11-07 MED ORDER — FENTANYL CITRATE (PF) 250 MCG/5ML IJ SOLN
INTRAMUSCULAR | Status: DC | PRN
  Administered 2023-11-07 (×3): 50 ug via INTRAVENOUS
  Administered 2023-11-07: 100 ug via INTRAVENOUS

## 2023-11-07 MED ORDER — LIDOCAINE 2% (20 MG/ML) 5 ML SYRINGE
INTRAMUSCULAR | Status: DC | PRN
  Administered 2023-11-07: 80 mg via INTRAVENOUS

## 2023-11-07 MED ORDER — DEXAMETHASONE SODIUM PHOSPHATE 10 MG/ML IJ SOLN
INTRAMUSCULAR | Status: DC | PRN
  Administered 2023-11-07: 10 mg via INTRAVENOUS

## 2023-11-07 MED ORDER — ROCURONIUM BROMIDE 10 MG/ML (PF) SYRINGE
PREFILLED_SYRINGE | INTRAVENOUS | Status: DC | PRN
  Administered 2023-11-07: 10 mg via INTRAVENOUS
  Administered 2023-11-07: 50 mg via INTRAVENOUS

## 2023-11-07 MED ORDER — KETAMINE HCL 10 MG/ML IJ SOLN
INTRAMUSCULAR | Status: DC | PRN
  Administered 2023-11-07: 30 mg via INTRAVENOUS

## 2023-11-07 MED ORDER — FENTANYL CITRATE PF 50 MCG/ML IJ SOSY
PREFILLED_SYRINGE | INTRAMUSCULAR | Status: AC
  Filled 2023-11-07: qty 2

## 2023-11-07 MED ORDER — BUPIVACAINE-EPINEPHRINE 0.25% -1:200000 IJ SOLN
INTRAMUSCULAR | Status: DC | PRN
  Administered 2023-11-07: 10 mL

## 2023-11-07 MED ORDER — FENTANYL CITRATE PF 50 MCG/ML IJ SOSY
25.0000 ug | PREFILLED_SYRINGE | INTRAMUSCULAR | Status: DC | PRN
Start: 2023-11-07 — End: 2023-11-07
  Administered 2023-11-07 (×2): 50 ug via INTRAVENOUS

## 2023-11-07 MED ORDER — ACETAMINOPHEN 500 MG PO TABS
1000.0000 mg | ORAL_TABLET | ORAL | Status: AC
  Administered 2023-11-07: 1000 mg via ORAL
  Filled 2023-11-07: qty 2

## 2023-11-07 MED ORDER — AMISULPRIDE (ANTIEMETIC) 5 MG/2ML IV SOLN
INTRAVENOUS | Status: AC
  Filled 2023-11-07: qty 4

## 2023-11-07 MED ORDER — CEFAZOLIN SODIUM-DEXTROSE 2-4 GM/100ML-% IV SOLN
2.0000 g | INTRAVENOUS | Status: AC
  Administered 2023-11-07: 2 g via INTRAVENOUS
  Filled 2023-11-07: qty 100

## 2023-11-07 MED ORDER — MIDAZOLAM HCL 5 MG/5ML IJ SOLN
INTRAMUSCULAR | Status: DC | PRN
  Administered 2023-11-07: 2 mg via INTRAVENOUS

## 2023-11-07 MED ORDER — 0.9 % SODIUM CHLORIDE (POUR BTL) OPTIME
TOPICAL | Status: DC | PRN
  Administered 2023-11-07: 1000 mL

## 2023-11-07 MED ORDER — AMISULPRIDE (ANTIEMETIC) 5 MG/2ML IV SOLN
10.0000 mg | Freq: Once | INTRAVENOUS | Status: AC | PRN
  Administered 2023-11-07: 10 mg via INTRAVENOUS

## 2023-11-07 MED ORDER — LACTATED RINGERS IV SOLN: INTRAVENOUS | Status: DC

## 2023-11-07 SURGICAL SUPPLY — 37 items
APPLIER CLIP ROT 10 11.4 M/L (STAPLE) ×1
BENZOIN TINCTURE PRP APPL 2/3 (GAUZE/BANDAGES/DRESSINGS) ×1 IMPLANT
CABLE HIGH FREQUENCY MONO STRZ (ELECTRODE) ×1 IMPLANT
CHLORAPREP W/TINT 26 (MISCELLANEOUS) ×1 IMPLANT
CLIP APPLIE ROT 10 11.4 M/L (STAPLE) ×1 IMPLANT
COVER MAYO STAND XLG (MISCELLANEOUS) ×1 IMPLANT
COVER SURGICAL LIGHT HANDLE (MISCELLANEOUS) ×1 IMPLANT
DRAPE C-ARM 42X120 X-RAY (DRAPES) ×1 IMPLANT
DRSG TEGADERM 2-3/8X2-3/4 SM (GAUZE/BANDAGES/DRESSINGS) ×3 IMPLANT
DRSG TEGADERM 4X4.75 (GAUZE/BANDAGES/DRESSINGS) ×1 IMPLANT
ELECT REM PT RETURN 15FT ADLT (MISCELLANEOUS) ×1 IMPLANT
GAUZE SPONGE 2X2 8PLY STRL LF (GAUZE/BANDAGES/DRESSINGS) IMPLANT
GLOVE BIO SURGEON STRL SZ7 (GLOVE) ×1 IMPLANT
GLOVE BIOGEL PI IND STRL 7.5 (GLOVE) ×1 IMPLANT
GOWN STRL REUS W/ TWL LRG LVL3 (GOWN DISPOSABLE) ×1 IMPLANT
HEMOSTAT SNOW SURGICEL 2X4 (HEMOSTASIS) IMPLANT
IRRIG SUCT STRYKERFLOW 2 WTIP (MISCELLANEOUS) ×1
IRRIGATION SUCT STRKRFLW 2 WTP (MISCELLANEOUS) ×1 IMPLANT
KIT BASIN OR (CUSTOM PROCEDURE TRAY) ×1 IMPLANT
KIT TURNOVER KIT A (KITS) IMPLANT
NS IRRIG 1000ML POUR BTL (IV SOLUTION) ×1 IMPLANT
POUCH RETRIEVAL ECOSAC 10 (ENDOMECHANICALS) IMPLANT
SCISSORS LAP 5X35 DISP (ENDOMECHANICALS) ×1 IMPLANT
SET CHOLANGIOGRAPH MIX (MISCELLANEOUS) ×1 IMPLANT
SET TUBE SMOKE EVAC HIGH FLOW (TUBING) ×1 IMPLANT
SLEEVE Z-THREAD 5X100MM (TROCAR) ×1 IMPLANT
SPIKE FLUID TRANSFER (MISCELLANEOUS) ×1 IMPLANT
STRIP CLOSURE SKIN 1/2X4 (GAUZE/BANDAGES/DRESSINGS) ×1 IMPLANT
SUT MNCRL AB 4-0 PS2 18 (SUTURE) ×1 IMPLANT
SUT VICRYL 0 UR6 27IN ABS (SUTURE) IMPLANT
SYS BAG RETRIEVAL 10MM (BASKET)
SYSTEM BAG RETRIEVAL 10MM (BASKET) IMPLANT
TOWEL OR 17X26 10 PK STRL BLUE (TOWEL DISPOSABLE) ×1 IMPLANT
TRAY LAPAROSCOPIC (CUSTOM PROCEDURE TRAY) ×1 IMPLANT
TROCAR 11X100 Z THREAD (TROCAR) ×1 IMPLANT
TROCAR BALLN 12MMX100 BLUNT (TROCAR) ×1 IMPLANT
TROCAR Z-THREAD OPTICAL 5X100M (TROCAR) ×1 IMPLANT

## 2023-11-07 NOTE — Interval H&P Note (Signed)
 History and Physical Interval Note:  11/07/2023 6:42 AM  Nathan Shaw  has presented today for surgery, with the diagnosis of CHRONIC CALCULUS CHOLECYSTITIS.  The various methods of treatment have been discussed with the patient and family. After consideration of risks, benefits and other options for treatment, the patient has consented to  Procedure(s): LAPAROSCOPIC CHOLECYSTECTOMY WITH INTRAOPERATIVE CHOLANGIOGRAM (N/A) as a surgical intervention.  The patient's history has been reviewed, patient examined, no change in status, stable for surgery.  I have reviewed the patient's chart and labs.  Questions were answered to the patient's satisfaction.     Nathan Shaw

## 2023-11-07 NOTE — Op Note (Signed)
 Laparoscopic Cholecystectomy with IOC/ laparoscopic lysis of adhesions Procedure Note  Indications: This is a 68 year old male with Crohn's disease status post multiple previous abdominal surgeries including a subtotal colectomy with ileorectal anastomosis performed at [facility] in 2021. His ileostomy has been reversed and converted to ileocolic anastomosis. The patient has had known gallstones for several years. However recently, he has noticed more frequent intermittent right upper quadrant abdominal pain. His symptoms are still relatively mild but he has noticed increase in frequency. 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 He is on Remicade  every other month as well as azathioprine  for his Crohn's disease. He recently had a liver function test performed that were normal. CT scan revealed cholelithiasis with no sign of acute cholecystitis. He presents now to discuss elective cholecystectomy.   Pre-operative Diagnosis: Calculus of gallbladder with other cholecystitis, without mention of obstruction; previous abdominal surgery  Post-operative Diagnosis: Same  Surgeon: Donnice MARLA Lima   Assistants: none  Anesthesia: General endotracheal anesthesia  ASA Class: 2  Procedure Details  The patient was seen again in the Holding Room. The risks, benefits, complications, treatment options, and expected outcomes were discussed with the patient. The possibilities of reaction to medication, pulmonary aspiration, perforation of viscus, bleeding, recurrent infection, finding a normal gallbladder, the need for additional procedures, failure to diagnose a condition, the possible need to convert to an open procedure, and creating a complication requiring transfusion or operation were discussed with the patient. The likelihood of improving the patient's symptoms with return to their baseline status is good.  The patient and/or family concurred with the proposed plan, giving  informed consent. The site of surgery properly noted. The patient was taken to Operating Room, identified as Nathan Shaw and the procedure verified as Laparoscopic Cholecystectomy with Intraoperative Cholangiogram. A Time Out was held and the above information confirmed.  Prior to the induction of general anesthesia, antibiotic prophylaxis was administered. General endotracheal anesthesia was then administered and tolerated well. After the induction, the abdomen was prepped with Chloraprep and draped in the sterile fashion. The patient was positioned in the supine position.  Local anesthetic agent was injected into the skin below the left costal margin.  A 5 mm Optiview trocar was used to cannulate the peritoneal cavity.  We insufflated CO2 maintaining a maximum pressure of 15 mm Hg.  I inserted the laparoscope.  There are omental and colon adhesions to the anterior abdominal wall.  I inserted an additional 5 mm port in the LLQ and used a combination of sharp dissection and cautery to take down some of the adhesions.  We were able to expose the posterior surface of the umbilicus and the abdominal wall in the RUQ.  We injected local anesthetic near the umbilicus and an incision made. We dissected down to the abdominal fascia with blunt dissection.  The fascia was incised vertically and we entered the peritoneal cavity bluntly.  A pursestring suture of 0-Vicryl was placed around the fascial opening.  The Hasson cannula was inserted and secured with the stay suture.   An 11-mm port was placed in the LUQ position to replace the 5 mm port.  Two 5-mm ports were placed in the right upper quadrant. All skin incisions were infiltrated with a local anesthetic agent before making the incision and placing the trocars.   We positioned the patient in reverse Trendelenburg, tilted slightly to the patient's left.  The gallbladder was identified, the fundus grasped and retracted cephalad.  There are significant dense omental  adhesions to the gallbladder and liver.  Adhesions were lysed bluntly and with the electrocautery where indicated, taking care not to injure any adjacent organs or viscus. The infundibulum was grasped and retracted laterally, exposing the peritoneum overlying the triangle of Calot. This was then divided and exposed in a blunt fashion. A critical view of the cystic duct and cystic artery was obtained.  The cystic duct was clearly identified and bluntly dissected circumferentially. The cystic duct was ligated with a clip distally.   An incision was made in the cystic duct and the [facility] cholangiogram catheter introduced. The catheter was secured using a clip. A cholangiogram was then obtained which showed good visualization of the distal and proximal biliary tree with no sign of filling defects or obstruction.  Contrast flowed easily into the duodenum. The catheter was then removed.   The cystic duct was then ligated with clips and divided. The cystic artery was identified, dissected free, ligated with clips and divided as well.   The gallbladder was dissected from the liver bed in retrograde fashion with the electrocautery. Some bile was spilled but this was suctioned.  No stones were noted.  The gallbladder was removed and placed in an Eco sac. The liver bed was irrigated and inspected. Hemostasis was achieved with the electrocautery and SNOW. Copious irrigation was utilized and was repeatedly aspirated until clear.  The gallbladder and Eco sac were then removed through the umbilical port site.  The pursestring suture was used to close the umbilical fascia.    We again inspected the right upper quadrant for hemostasis.  Pneumoperitoneum was released as we removed the trocars.  4-0 Monocryl was used to close the skin.   Benzoin, steri-strips, and clean dressings were applied. The patient was then extubated and brought to the recovery room in stable condition. Instrument, sponge, and needle counts were correct at  closure and at the conclusion of the case.   Findings: Cholecystitis with Cholelithiasis  Estimated Blood Loss: less than 50 mL         Drains: none         Specimens: Gallbladder           Complications: None; patient tolerated the procedure well.         Disposition: PACU - hemodynamically stable.         Condition: stable  Donnice POUR. Belinda, MD, [facility]  General Surgery   11/07/2023 9:51 AM

## 2023-11-07 NOTE — Discharge Instructions (Signed)
 [facility] ______[facility] LAPAROSCOPIC SURGERY: POST OP INSTRUCTIONS Always review your discharge instruction sheet given to you by the facility where your surgery was performed. IF YOU HAVE DISABILITY OR FAMILY LEAVE FORMS, YOU MUST BRING THEM TO THE OFFICE FOR PROCESSING.   DO NOT GIVE THEM TO YOUR DOCTOR.  A prescription for pain medication may be given to you upon discharge.  Take your pain medication as prescribed, if needed.  If narcotic pain medicine is not needed, then you may take acetaminophen  (Tylenol ) or ibuprofen  (Advil ) as needed. Take your usually prescribed medications unless otherwise directed. If you need a refill on your pain medication, please contact your pharmacy.  They will contact our office to request authorization. Prescriptions will not be filled after 5pm or on week-ends. You should follow a light diet the first few days after arrival home, such as soup and crackers, etc.  Be sure to include lots of fluids daily. Most patients will experience some swelling and bruising in the area of the incisions.  Ice packs will help.  Swelling and bruising can take several days to resolve.  It is common to experience some constipation if taking pain medication after surgery.  Increasing fluid intake and taking a stool softener (such as Colace) will usually help or prevent this problem from occurring.  A mild laxative (Milk of Magnesia or Miralax) should be taken according to package instructions if there are no bowel movements after 48 hours. Unless discharge instructions indicate otherwise, you may remove your bandages 24-48 hours after surgery, and you may shower at that time.  You may have steri-strips (small skin tapes) in place directly over the incision.  These strips should be left on the skin for 7-10 days.  If your surgeon used skin glue on the incision, you may shower in 24 hours.  The glue will flake off over the next 2-3 weeks.  Any sutures or staples will be  removed at the office during your follow-up visit. ACTIVITIES:  You may resume regular (light) daily activities beginning the next day--such as daily self-care, walking, climbing stairs--gradually increasing activities as tolerated.  You may have sexual intercourse when it is comfortable.  Refrain from any heavy lifting or straining until approved by your doctor. You may drive when you are no longer taking prescription pain medication, you can comfortably wear a seatbelt, and you can safely maneuver your car and apply brakes. RETURN TO WORK:  __________________________________________________________ Rosine should see your doctor in the office for a follow-up appointment approximately 2-3 weeks after your surgery.  Make sure that you call for this appointment within a day or two after you arrive home to insure a convenient appointment time. OTHER INSTRUCTIONS: __________________________________________________________________________________________________________________________ __________________________________________________________________________________________________________________________ WHEN TO CALL YOUR DOCTOR: Fever over 101.0 Inability to urinate Continued bleeding from incision. Increased pain, redness, or drainage from the incision. Increasing abdominal pain  The clinic staff is available to answer your questions during regular business hours.  Please don't hesitate to call and ask to speak to one of the nurses for clinical concerns.  If you have a medical emergency, go to the nearest emergency room or call 911.  A surgeon from [facility] is always on call at the hospital. [address], [address], [place], [place]  [address] ? [address] [address], [place], [place]   [address] [phone] ? [phone] ? FAX [phone] Web site: [email or web address]

## 2023-11-07 NOTE — Anesthesia Procedure Notes (Signed)
 Procedure Name: Intubation Date/Time: 11/07/2023 8:09 AM  Performed by: Hanley Delon POUR, CRNAPre-anesthesia Checklist: Patient identified, Emergency Drugs available, Suction available and Patient being monitored Patient Re-evaluated:Patient Re-evaluated prior to induction Oxygen Delivery Method: Circle System Utilized Preoxygenation: Pre-oxygenation with 100% oxygen Induction Type: IV induction Ventilation: Mask ventilation without difficulty, Two handed mask ventilation required and Oral airway inserted - appropriate to patient size Laryngoscope Size: Cleotilde and 3 Grade View: Grade II Tube type: Oral Tube size: 7.0 mm Number of attempts: 1 Airway Equipment and Method: Stylet and Oral airway Placement Confirmation: ETT inserted through vocal cords under direct vision, positive ETCO2 and breath sounds checked- equal and bilateral Secured at: 22 cm Tube secured with: Tape Dental Injury: Teeth and Oropharynx as per pre-operative assessment

## 2023-11-07 NOTE — Transfer of Care (Signed)
 Immediate Anesthesia Transfer of Care Note  Patient: Nathan Shaw  Procedure(s) Performed: LAPAROSCOPIC CHOLECYSTECTOMY WITH INTRAOPERATIVE CHOLANGIOGRAM  Patient Location: PACU  Anesthesia Type:General  Level of Consciousness: sedated and patient cooperative  Airway & Oxygen Therapy: Patient Spontanous Breathing and Patient connected to face mask oxygen  Post-op Assessment: Report given to RN and Post -op Vital signs reviewed and stable  Post vital signs: Reviewed  Last Vitals:  Vitals Value Taken Time  BP 156/84 11/07/23 1000  Temp    Pulse 62 11/07/23 1001  Resp 13 11/07/23 1001  SpO2 100 % 11/07/23 1001  Vitals shown include unfiled device data.  Last Pain:  Vitals:   11/07/23 0603  TempSrc: Oral  PainSc:       Patients Stated Pain Goal: 3 (11/07/23 0600)  Complications: No notable events documented.

## 2023-11-07 NOTE — Anesthesia Preprocedure Evaluation (Addendum)
 Anesthesia Evaluation  Patient identified by MRN, date of birth, ID band Patient awake    Reviewed: Allergy & Precautions, NPO status , Patient's Chart, lab work & pertinent test results  Airway Mallampati: II  TM Distance: >3 FB Neck ROM: Full    Dental  (+) Dental Advisory Given, Teeth Intact   Pulmonary former smoker   breath sounds clear to auscultation       Cardiovascular negative cardio ROS  Rhythm:Regular Rate:Normal     Neuro/Psych negative neurological ROS     GI/Hepatic Neg liver ROS,GERD  ,,Crohns Dz   Endo/Other  negative endocrine ROS    Renal/GU negative Renal ROS     Musculoskeletal   Abdominal   Peds  Hematology negative hematology ROS (+)   Anesthesia Other Findings   Reproductive/Obstetrics                              Anesthesia Physical Anesthesia Plan  ASA: 2  Anesthesia Plan: General   Post-op Pain Management: Tylenol  PO (pre-op)* and Toradol  IV (intra-op)*   Induction: Intravenous  PONV Risk Score and Plan: 2 and Dexamethasone , Ondansetron  and Treatment may vary due to age or medical condition  Airway Management Planned: Oral ETT  Additional Equipment:   Intra-op Plan:   Post-operative Plan: Extubation in OR  Informed Consent: I have reviewed the patients History and Physical, chart, labs and discussed the procedure including the risks, benefits and alternatives for the proposed anesthesia with the patient or authorized representative who has indicated his/her understanding and acceptance.     Dental advisory given  Plan Discussed with:   Anesthesia Plan Comments:        Anesthesia Quick Evaluation

## 2023-11-08 ENCOUNTER — Encounter (HOSPITAL_COMMUNITY): Payer: Self-pay | Admitting: Surgery

## 2023-11-08 LAB — SURGICAL PATHOLOGY

## 2023-11-08 NOTE — Anesthesia Postprocedure Evaluation (Signed)
 Anesthesia Post Note  Patient: Nathan Shaw  Procedure(s) Performed: LAPAROSCOPIC CHOLECYSTECTOMY WITH INTRAOPERATIVE CHOLANGIOGRAM     Patient location during evaluation: PACU Anesthesia Type: General Level of consciousness: awake and alert Pain management: pain level controlled Vital Signs Assessment: post-procedure vital signs reviewed and stable Respiratory status: spontaneous breathing, nonlabored ventilation, respiratory function stable and patient connected to nasal cannula oxygen Cardiovascular status: blood pressure returned to baseline and stable Postop Assessment: no apparent nausea or vomiting Anesthetic complications: no   No notable events documented.  Last Vitals:  Vitals:   11/07/23 1131 11/07/23 1215  BP: 137/74 126/78  Pulse: 60 62  Resp:  14  Temp: 36.6 C 36.7 C  SpO2: 97% 99%    Last Pain:  Vitals:   11/07/23 1215  TempSrc:   PainSc: 5                  Epifanio Lamar BRAVO

## 2024-08-10 ENCOUNTER — Other Ambulatory Visit: Payer: Self-pay | Admitting: Gastroenterology

## 2024-08-10 DIAGNOSIS — R748 Abnormal levels of other serum enzymes: Secondary | ICD-10-CM

## 2024-08-14 ENCOUNTER — Ambulatory Visit
Admission: RE | Admit: 2024-08-14 | Discharge: 2024-08-14 | Disposition: A | Discharge status: Home / Self Care | Source: Ambulatory Visit | Attending: Gastroenterology | Admitting: Gastroenterology

## 2024-08-14 DIAGNOSIS — R748 Abnormal levels of other serum enzymes: Secondary | ICD-10-CM

## 2024-09-16 ENCOUNTER — Ambulatory Visit (HOSPITAL_BASED_OUTPATIENT_CLINIC_OR_DEPARTMENT_OTHER)

## 2024-09-16 ENCOUNTER — Ambulatory Visit (INDEPENDENT_AMBULATORY_CARE_PROVIDER_SITE_OTHER): Admitting: Orthopaedic Surgery

## 2024-09-16 DIAGNOSIS — M25562 Pain in left knee: Secondary | ICD-10-CM | POA: Diagnosis not present

## 2024-09-16 DIAGNOSIS — G8929 Other chronic pain: Secondary | ICD-10-CM

## 2024-09-16 NOTE — Progress Notes (Signed)
 Chief Complaint: Left knee pain     History of Present Illness:    Nathan Shaw is a 68 y.o. male presents with ongoing left knee pain after an injury 3 months prior.  He was doing a deep squat and felt a pop.  Since that time he has had pain with deeper flexion of the knee.  He is currently retired but is very active.  He has been limited in terms of longer walks and has been experiencing persistent pain medially with swelling    PMH/PSH/Family History/Social History/Meds/Allergies:    Past Medical History:  Diagnosis Date   BPH associated with nocturia    Crohn disease (HCC)    followed by dr saintclair  [facility])-- dx 03/ 2010,   treated with Humira and Imuran  (hx ileal perforation with abscess/ ileitis crohn's,  s/p  ileocecectomy and small bowel resection 04-11-2017)   Dysrhythmia    GERD (gastroesophageal reflux disease)    History of kidney stones    History of prostatitis    Right ureteral stone    Wears contact lenses    Wears hearing aid in both ears    Past Surgical History:  Procedure Laterality Date   CARPAL TUNNEL RELEASE Right 08/05/2014   Procedure: RIGHT CARPAL TUNNEL RELEASE;  Surgeon: Arley Curia, MD;  Location: [facility];  Service: Orthopedics;  Laterality: Right;   CHOLECYSTECTOMY N/A 11/07/2023   Procedure: LAPAROSCOPIC CHOLECYSTECTOMY WITH INTRAOPERATIVE CHOLANGIOGRAM;  Surgeon: Belinda Cough, MD;  Location: [facility];  Service: General;  Laterality: N/A;   COLONOSCOPY WITH PROPOFOL  N/A 02/16/2014   Procedure: COLONOSCOPY WITH PROPOFOL ;  Surgeon: Gladis MARLA Louder, MD;  Location: [facility];  Service: Endoscopy;  Laterality: N/A;   CYSTOSCOPY WITH RETROGRADE PYELOGRAM, URETEROSCOPY AND STENT PLACEMENT Right 10/20/2018   Procedure: CYSTOSCOPY WITH RIGHT RETROGRADE PYELOGRAM, URETEROSCOPY HOLMIUM LASER AND STENT PLACEMENT;  Surgeon: Carolee Sherwood JONETTA DOUGLAS, MD;  Location: [facility];  Service: Urology;  Laterality: Right;    CYSTOSCOPY/RETROGRADE/URETEROSCOPY/STONE EXTRACTION WITH BASKET  11/ 2018   @[facility]    HOLMIUM LASER APPLICATION N/A 10/20/2018   Procedure: HOLMIUM LASER APPLICATION;  Surgeon: Carolee Sherwood JONETTA DOUGLAS, MD;  Location: [facility];  Service: Urology;  Laterality: N/A;   LAPAROSCOPIC ILEOCECECTOMY N/A 04/11/2017   Procedure: LAPAROSCOPIC ILEOCECECTOMY AND SMALL BOWEL RESECTION;  Surgeon: Debby Hila, MD;  Location: [facility];  Service: General;  Laterality: N/A;   LUNG BIOPSY  1997   granuloma   Social History   Socioeconomic History   Marital status: Significant Other    Spouse name: Not on file   Number of children: Not on file   Years of education: Not on file   Highest education level: Not on file  Occupational History   Not on file  Tobacco Use   Smoking status: Former    Current packs/day: 0.00    Average packs/day: 1.5 packs/day for 18.0 years (27.0 ttl pk-yrs)    Types: Cigarettes    Start date: 10/01/1985    Quit date: 10/02/2003    Years since quitting: 20.9   Smokeless tobacco: Never  Vaping Use   Vaping status: Never Used  Substance and Sexual Activity   Alcohol use: Yes    Comment: daily 1-2   Drug use: No   Sexual activity: Not on file  Other Topics Concern   Not on file  Social History Narrative   ** Merged History Encounter **       Social Drivers  of Health   Tobacco Use: Low Risk (05/25/2024)   Received from [facility]   Patient History    Smoking Tobacco Use: Never    Smokeless Tobacco Use: Never    Passive Exposure: Never  Financial Resource Strain: Not on file  Food Insecurity: Not on file  Transportation Needs: Not on file  Physical Activity: Not on file  Stress: Not on file  Social Connections: Not on file  Depression (PHQ2-9): Low Risk (11/14/2021)   Depression (PHQ2-9)    PHQ-2 Score: 0  Alcohol Screen: Not on file  Housing: Unknown (10/25/2023)   Received from [facility]   Epic    Unable to Pay for Housing in the  Last Year: Not on file    Number of Times Moved in the Last Year: Not on file    At any time in the past 12 months, were you homeless or living in a shelter (including now)?: No  Utilities: Not on file  Health Literacy: Not on file   Family History  Problem Relation Age of Onset   Crohn's disease Sister    Lung cancer Mother        doing well on Keytruda   Hypertension Neg Hx    Diabetes Mellitus II Neg Hx    Allergies[1] Current Outpatient Medications  Medication Sig Dispense Refill   aspirin EC 81 MG tablet Take 81 mg by mouth daily.     atorvastatin (LIPITOR) 10 MG tablet Take 10 mg by mouth daily.     azathioprine  (IMURAN ) 100 MG tablet Take 150 mg by mouth daily.     cetirizine (ZYRTEC) 10 MG tablet Take 10 mg by mouth daily.     Cholecalciferol  (VITAMIN D ) 2000 units CAPS Take 4,000 Units by mouth daily.     cyanocobalamin  1000 MCG tablet Take 1,000 mcg by mouth.     fluticasone  (FLONASE ) 50 MCG/ACT nasal spray Place 1 spray into both nostrils daily as needed for allergies.     inFLIXimab -axxq (AVSOLA ) 100 MG injection See admin instructions.     Multiple Vitamin (MULTIVITAMIN WITH MINERALS) TABS tablet Take 1 tablet by mouth daily.     omeprazole (PRILOSEC) 20 MG capsule Take 20 mg by mouth every other day.     oxyCODONE  (OXY IR/ROXICODONE ) 5 MG immediate release tablet Take 1 tablet (5 mg total) by mouth every 6 (six) hours as needed for severe pain (pain score 7-10). 20 tablet 0   sildenafil (REVATIO) 20 MG tablet Take 20 mg by mouth daily as needed (erectile dysfunction).     tamsulosin (FLOMAX) 0.4 MG CAPS capsule Take 0.4 mg by mouth daily.     valACYclovir  (VALTREX ) 500 MG tablet Take 500 mg by mouth daily.     No current facility-administered medications for this visit.   No results found.  Review of Systems:   A ROS was performed including pertinent positives and negatives as documented in the HPI.  Physical Exam :   Constitutional: NAD and appears stated  age Neurological: Alert and oriented Psych: Appropriate affect and cooperative There were no vitals taken for this visit.   Comprehensive Musculoskeletal Exam:    Left knee with tenderness about the medial joint line.  Positive McMurray medially.  Range of motion is from -3 to 130 degrees negative Lachman negative posterior drawer no varus valgus laxity   Imaging:   Xray (4 views left knee): Normal    I personally reviewed and interpreted the radiographs.   Assessment and  Plan:   68 y.o. male with evidence of a possible meniscal root injury after an injury 3 months prior where he felt a pop.  At this time I did discuss that given that a meniscal root injury would be in the differential that I would like to obtain an MRI so that we can further discuss and rule this out.  Will plan for an MRI of the left knee and I will see him back discuss results   I personally saw and evaluated the patient, and participated in the management and treatment plan.  Elspeth Parker, MD Attending Physician, Orthopedic Surgery  This document was dictated using Dragon voice recognition software. A reasonable attempt at proof reading has been made to minimize errors.    [1] No Known Allergies

## 2024-09-17 ENCOUNTER — Other Ambulatory Visit: Payer: Self-pay | Admitting: Orthopaedic Surgery

## 2024-09-17 DIAGNOSIS — G8929 Other chronic pain: Secondary | ICD-10-CM

## 2024-10-07 ENCOUNTER — Ambulatory Visit
Admission: RE | Admit: 2024-10-07 | Discharge: 2024-10-07 | Disposition: A | Discharge status: Home / Self Care | Source: Ambulatory Visit | Attending: Orthopaedic Surgery | Admitting: Orthopaedic Surgery

## 2024-10-07 DIAGNOSIS — G8929 Other chronic pain: Secondary | ICD-10-CM

## 2024-10-09 ENCOUNTER — Ambulatory Visit (INDEPENDENT_AMBULATORY_CARE_PROVIDER_SITE_OTHER): Admitting: Orthopaedic Surgery

## 2024-10-09 DIAGNOSIS — G8929 Other chronic pain: Secondary | ICD-10-CM | POA: Diagnosis not present

## 2024-10-09 DIAGNOSIS — M25562 Pain in left knee: Secondary | ICD-10-CM | POA: Diagnosis not present

## 2024-10-09 MED ORDER — TRIAMCINOLONE ACETONIDE 40 MG/ML IJ SUSP
80.0000 mg | INTRAMUSCULAR | Status: AC | PRN
  Administered 2024-10-09: 80 mg via INTRA_ARTICULAR

## 2024-10-09 MED ORDER — LIDOCAINE HCL 1 % IJ SOLN
4.0000 mL | INTRAMUSCULAR | Status: AC | PRN
  Administered 2024-10-09: 4 mL

## 2024-10-09 NOTE — Progress Notes (Signed)
 "   Chief Complaint: Left knee pain     History of Present Illness:   10/09/2024: Returns today for MRI discussion of the left knee.  Nathan Shaw is a 69 y.o. male presents with ongoing left knee pain after an injury 3 months prior.  He was doing a deep squat and felt a pop.  Since that time he has had pain with deeper flexion of the knee.  He is currently retired but is very active.  He has been limited in terms of longer walks and has been experiencing persistent pain medially with swelling    PMH/PSH/Family History/Social History/Meds/Allergies:    Past Medical History:  Diagnosis Date   BPH associated with nocturia    Crohn disease (HCC)    followed by dr saintclair  [facility])-- dx 03/ 2010,   treated with Humira and Imuran  (hx ileal perforation with abscess/ ileitis crohn's,  s/p  ileocecectomy and small bowel resection 04-11-2017)   Dysrhythmia    GERD (gastroesophageal reflux disease)    History of kidney stones    History of prostatitis    Right ureteral stone    Wears contact lenses    Wears hearing aid in both ears    Past Surgical History:  Procedure Laterality Date   CARPAL TUNNEL RELEASE Right 08/05/2014   Procedure: RIGHT CARPAL TUNNEL RELEASE;  Surgeon: Arley Curia, MD;  Location: [facility];  Service: Orthopedics;  Laterality: Right;   CHOLECYSTECTOMY N/A 11/07/2023   Procedure: LAPAROSCOPIC CHOLECYSTECTOMY WITH INTRAOPERATIVE CHOLANGIOGRAM;  Surgeon: Belinda Cough, MD;  Location: [facility];  Service: General;  Laterality: N/A;   COLONOSCOPY WITH PROPOFOL  N/A 02/16/2014   Procedure: COLONOSCOPY WITH PROPOFOL ;  Surgeon: Gladis MARLA Louder, MD;  Location: [facility];  Service: Endoscopy;  Laterality: N/A;   CYSTOSCOPY WITH RETROGRADE PYELOGRAM, URETEROSCOPY AND STENT PLACEMENT Right 10/20/2018   Procedure: CYSTOSCOPY WITH RIGHT RETROGRADE PYELOGRAM, URETEROSCOPY HOLMIUM LASER AND STENT PLACEMENT;  Surgeon: Carolee Sherwood JONETTA DOUGLAS, MD;  Location: [facility];  Service: Urology;  Laterality: Right;   CYSTOSCOPY/RETROGRADE/URETEROSCOPY/STONE EXTRACTION WITH BASKET  11/ 2018   @[facility]    HOLMIUM LASER APPLICATION N/A 10/20/2018   Procedure: HOLMIUM LASER APPLICATION;  Surgeon: Carolee Sherwood JONETTA DOUGLAS, MD;  Location: [facility];  Service: Urology;  Laterality: N/A;   LAPAROSCOPIC ILEOCECECTOMY N/A 04/11/2017   Procedure: LAPAROSCOPIC ILEOCECECTOMY AND SMALL BOWEL RESECTION;  Surgeon: Debby Hila, MD;  Location: [facility];  Service: General;  Laterality: N/A;   LUNG BIOPSY  1997   granuloma   Social History   Socioeconomic History   Marital status: Significant Other    Spouse name: Not on file   Number of children: Not on file   Years of education: Not on file   Highest education level: Not on file  Occupational History   Not on file  Tobacco Use   Smoking status: Former    Current packs/day: 0.00    Average packs/day: 1.5 packs/day for 18.0 years (27.0 ttl pk-yrs)    Types: Cigarettes    Start date: 10/01/1985    Quit date: 10/02/2003    Years since quitting: 21.0   Smokeless tobacco: Never  Vaping Use   Vaping status: Never Used  Substance and Sexual Activity   Alcohol use: Yes    Comment: daily 1-2   Drug use: No   Sexual activity: Not on file  Other Topics Concern   Not on file  Social History Narrative   ** Merged  History Encounter **       Social Drivers of Health   Tobacco Use: Low Risk (05/25/2024)   Received from [facility]   Patient History    Smoking Tobacco Use: Never    Smokeless Tobacco Use: Never    Passive Exposure: Never  Financial Resource Strain: Not on file  Food Insecurity: Not on file  Transportation Needs: Not on file  Physical Activity: Not on file  Stress: Not on file  Social Connections: Not on file  Depression (PHQ2-9): Low Risk (11/14/2021)   Depression (PHQ2-9)    PHQ-2 Score: 0  Alcohol Screen: Not on file  Housing: Unknown (10/25/2023)    Received from [facility]   Epic    Unable to Pay for Housing in the Last Year: Not on file    Number of Times Moved in the Last Year: Not on file    At any time in the past 12 months, were you homeless or living in a shelter (including now)?: No  Utilities: Not on file  Health Literacy: Not on file   Family History  Problem Relation Age of Onset   Crohn's disease Sister    Lung cancer Mother        doing well on Keytruda   Hypertension Neg Hx    Diabetes Mellitus II Neg Hx    Allergies[1] Current Outpatient Medications  Medication Sig Dispense Refill   aspirin EC 81 MG tablet Take 81 mg by mouth daily.     atorvastatin (LIPITOR) 10 MG tablet Take 10 mg by mouth daily.     azathioprine  (IMURAN ) 100 MG tablet Take 150 mg by mouth daily.     cetirizine (ZYRTEC) 10 MG tablet Take 10 mg by mouth daily.     Cholecalciferol  (VITAMIN D ) 2000 units CAPS Take 4,000 Units by mouth daily.     cyanocobalamin  1000 MCG tablet Take 1,000 mcg by mouth.     fluticasone  (FLONASE ) 50 MCG/ACT nasal spray Place 1 spray into both nostrils daily as needed for allergies.     inFLIXimab -axxq (AVSOLA ) 100 MG injection See admin instructions.     Multiple Vitamin (MULTIVITAMIN WITH MINERALS) TABS tablet Take 1 tablet by mouth daily.     omeprazole (PRILOSEC) 20 MG capsule Take 20 mg by mouth every other day.     oxyCODONE  (OXY IR/ROXICODONE ) 5 MG immediate release tablet Take 1 tablet (5 mg total) by mouth every 6 (six) hours as needed for severe pain (pain score 7-10). 20 tablet 0   sildenafil (REVATIO) 20 MG tablet Take 20 mg by mouth daily as needed (erectile dysfunction).     tamsulosin (FLOMAX) 0.4 MG CAPS capsule Take 0.4 mg by mouth daily.     valACYclovir  (VALTREX ) 500 MG tablet Take 500 mg by mouth daily.     No current facility-administered medications for this visit.   MR Knee Left  Wo Contrast Result Date: 10/09/2024 MR KNEE WITHOUT IV CONTRAST LEFT  COMPARISON: None. CLINICAL HISTORY: Meniscal injury, knee. Left knee pain. PULSE SEQUENCES: Ax PD FS, Sag T2 ACL, Sag PD FS, Cor PD FS & COR T1 FINDINGS: Bones: There is mild tricompartmental osteoarthrosis most notably the medial compartment. There is chondromalacia without significant full-thickness cartilage defect. No subchondral reactive edema is identified. Small joint effusion and mild Baker's cyst. The extensor mechanism is intact. Ligaments: The ACL, PCL, MCL and fibular collateral ligament are intact. Menisci: Lateral meniscus is unremarkable. There is a horizontal tear of the posterior horn and  body of the medial meniscus. No displaced medial meniscal tear is present. IMPRESSION: Tricompartmental osteoarthrosis with chondromalacia. No significant full-thickness cartilage defect. Small reactive joint effusion small Baker's cyst. Horizontal tear of the posterior horn and body of the medial meniscus. No displaced meniscal flap. Correlation for medial joint line symptoms. Electronically signed by: Norleen Satchel MD 10/09/2024 11:54 AM EST RP Workstation: [id number]    Review of Systems:   A ROS was performed including pertinent positives and negatives as documented in the HPI.  Physical Exam :   Constitutional: NAD and appears stated age Neurological: Alert and oriented Psych: Appropriate affect and cooperative There were no vitals taken for this visit.   Comprehensive Musculoskeletal Exam:    Left knee with tenderness about the medial joint line.  Positive McMurray medially.  Range of motion is from -3 to 130 degrees negative Lachman negative posterior drawer no varus valgus laxity   Imaging:   Xray (4 views left knee): Normal  MRI left knee: There is a horizontal longitudinal medial meniscal tear involving the posterior third of body without root involvement  I personally reviewed and interpreted the radiographs.   Assessment and Plan:   69 y.o. male with a medial meniscal injury  following a traumatic injury.  At this time he does have an upcoming trip to the [place] planned.  As result he is requesting a left knee injection which I did provide after verbal consent is obtained.  He will follow-up with me following his upcoming cruise  -Left knee injection provided after verbal consent obtained    Procedure Note  Patient: Nathan Shaw             Date of Birth: December 16, 1955           MRN: [id number]             Visit Date: 10/09/2024  Procedures: Visit Diagnoses: No diagnosis found.  Large Joint Inj: L knee on 10/09/2024 3:21 PM Indications: pain Details: 22 G 1.5 in needle, ultrasound-guided anterior approach  Arthrogram: No  Medications: 4 mL lidocaine  1 %; 80 mg triamcinolone  acetonide 40 MG/ML Outcome: tolerated well, no immediate complications Procedure, treatment alternatives, risks and benefits explained, specific risks discussed. Consent was given by the patient. Immediately prior to procedure a time out was called to verify the correct patient, procedure, equipment, support staff and site/side marked as required. Patient was prepped and draped in the usual sterile fashion.         I personally saw and evaluated the patient, and participated in the management and treatment plan.  Elspeth Parker, MD Attending Physician, Orthopedic Surgery  This document was dictated using Dragon voice recognition software. A reasonable attempt at proof reading has been made to minimize errors.      [1] No Known Allergies "
